# Patient Record
Sex: Female | Born: 1935 | Race: White | Hispanic: No | Smoking: Never smoker
Health system: Southern US, Community
[De-identification: ages and names within clinical notes are randomized; demographics above are authoritative.]

## PROBLEM LIST (undated history)

## (undated) DIAGNOSIS — H409 Unspecified glaucoma: Secondary | ICD-10-CM

## (undated) DIAGNOSIS — C801 Malignant (primary) neoplasm, unspecified: Secondary | ICD-10-CM

## (undated) DIAGNOSIS — M810 Age-related osteoporosis without current pathological fracture: Secondary | ICD-10-CM

## (undated) DIAGNOSIS — I272 Pulmonary hypertension, unspecified: Secondary | ICD-10-CM

## (undated) DIAGNOSIS — E785 Hyperlipidemia, unspecified: Secondary | ICD-10-CM

## (undated) DIAGNOSIS — H3530 Unspecified macular degeneration: Secondary | ICD-10-CM

## (undated) DIAGNOSIS — L409 Psoriasis, unspecified: Secondary | ICD-10-CM

## (undated) DIAGNOSIS — E079 Disorder of thyroid, unspecified: Secondary | ICD-10-CM

## (undated) DIAGNOSIS — L570 Actinic keratosis: Secondary | ICD-10-CM

## (undated) HISTORY — PX: BREAST BIOPSY: SHX20

## (undated) HISTORY — PX: TONSILLECTOMY: SUR1361

## (undated) HISTORY — DX: Actinic keratosis: L57.0

---

## 2004-09-17 ENCOUNTER — Ambulatory Visit: Payer: Self-pay | Admitting: Internal Medicine

## 2005-10-22 ENCOUNTER — Ambulatory Visit: Payer: Self-pay | Admitting: Internal Medicine

## 2006-10-27 ENCOUNTER — Ambulatory Visit: Payer: Self-pay | Admitting: Internal Medicine

## 2007-11-05 ENCOUNTER — Ambulatory Visit: Payer: Self-pay | Admitting: Internal Medicine

## 2008-08-09 ENCOUNTER — Ambulatory Visit: Payer: Self-pay | Admitting: Internal Medicine

## 2008-10-21 ENCOUNTER — Ambulatory Visit: Payer: Self-pay | Admitting: Unknown Physician Specialty

## 2008-11-07 ENCOUNTER — Ambulatory Visit: Payer: Self-pay | Admitting: Internal Medicine

## 2010-02-15 ENCOUNTER — Ambulatory Visit: Payer: Self-pay | Admitting: Internal Medicine

## 2011-03-05 ENCOUNTER — Ambulatory Visit: Payer: Self-pay | Admitting: Internal Medicine

## 2012-02-22 ENCOUNTER — Ambulatory Visit: Payer: Self-pay | Admitting: Medical

## 2012-03-31 ENCOUNTER — Ambulatory Visit: Payer: Self-pay

## 2012-05-27 ENCOUNTER — Ambulatory Visit: Payer: Self-pay | Admitting: Ophthalmology

## 2012-07-08 ENCOUNTER — Ambulatory Visit: Payer: Self-pay | Admitting: Ophthalmology

## 2013-08-04 ENCOUNTER — Ambulatory Visit: Payer: Self-pay | Admitting: Internal Medicine

## 2013-08-10 ENCOUNTER — Ambulatory Visit: Payer: Self-pay | Admitting: Internal Medicine

## 2013-09-01 ENCOUNTER — Ambulatory Visit: Payer: Self-pay | Admitting: Gastroenterology

## 2013-09-21 ENCOUNTER — Ambulatory Visit: Payer: Self-pay | Admitting: Internal Medicine

## 2013-10-13 ENCOUNTER — Ambulatory Visit: Payer: Self-pay | Admitting: Gastroenterology

## 2014-01-12 ENCOUNTER — Ambulatory Visit: Payer: Self-pay | Admitting: Family Medicine

## 2014-01-12 LAB — URINALYSIS, COMPLETE
Bilirubin,UR: NEGATIVE
Blood: NEGATIVE
GLUCOSE, UR: NEGATIVE mg/dL (ref 0–75)
Ketone: NEGATIVE
Nitrite: NEGATIVE
PH: 7.5 (ref 4.5–8.0)
PROTEIN: NEGATIVE
Specific Gravity: 1.005 (ref 1.003–1.030)

## 2014-01-12 LAB — BASIC METABOLIC PANEL
ANION GAP: 7 (ref 7–16)
BUN: 14 mg/dL (ref 7–18)
CALCIUM: 9.4 mg/dL (ref 8.5–10.1)
Chloride: 104 mmol/L (ref 98–107)
Co2: 31 mmol/L (ref 21–32)
Creatinine: 0.91 mg/dL (ref 0.60–1.30)
EGFR (African American): >60
EGFR (Non-African Amer.): 60 — ABNORMAL LOW
Glucose: 83 mg/dL (ref 65–99)
Osmolality: 283 (ref 275–301)
Potassium: 4.2 mmol/L (ref 3.5–5.1)
Sodium: 142 mmol/L (ref 136–145)

## 2014-01-14 LAB — URINE CULTURE

## 2014-11-10 ENCOUNTER — Ambulatory Visit
Admit: 2014-11-10 | Disposition: A | Discharge status: Home / Self Care | Payer: Self-pay | Attending: Internal Medicine | Admitting: Internal Medicine

## 2016-03-11 ENCOUNTER — Other Ambulatory Visit: Payer: Self-pay | Admitting: Internal Medicine

## 2016-03-11 DIAGNOSIS — Z1231 Encounter for screening mammogram for malignant neoplasm of breast: Secondary | ICD-10-CM

## 2016-03-28 ENCOUNTER — Ambulatory Visit: Payer: Self-pay

## 2016-04-02 ENCOUNTER — Other Ambulatory Visit: Payer: Self-pay | Admitting: Internal Medicine

## 2016-04-02 ENCOUNTER — Ambulatory Visit
Admission: RE | Admit: 2016-04-02 | Discharge: 2016-04-02 | Disposition: A | Discharge status: Home / Self Care | Payer: Medicare Other | Source: Ambulatory Visit | Attending: Internal Medicine | Admitting: Internal Medicine

## 2016-04-02 DIAGNOSIS — Z1231 Encounter for screening mammogram for malignant neoplasm of breast: Secondary | ICD-10-CM

## 2016-04-02 HISTORY — DX: Malignant (primary) neoplasm, unspecified: C80.1

## 2016-12-21 ENCOUNTER — Ambulatory Visit: Payer: Medicare Other

## 2016-12-21 ENCOUNTER — Encounter: Payer: Self-pay | Admitting: Emergency Medicine

## 2016-12-21 ENCOUNTER — Ambulatory Visit
Admission: EM | Admit: 2016-12-21 | Discharge: 2016-12-21 | Disposition: A | Discharge status: Home / Self Care | Payer: Medicare Other | Attending: Family Medicine | Admitting: Family Medicine

## 2016-12-21 DIAGNOSIS — J40 Bronchitis, not specified as acute or chronic: Secondary | ICD-10-CM

## 2016-12-21 DIAGNOSIS — R05 Cough: Secondary | ICD-10-CM | POA: Diagnosis present

## 2016-12-21 HISTORY — DX: Age-related osteoporosis without current pathological fracture: M81.0

## 2016-12-21 HISTORY — DX: Pulmonary hypertension, unspecified: I27.20

## 2016-12-21 HISTORY — DX: Psoriasis, unspecified: L40.9

## 2016-12-21 HISTORY — DX: Disorder of thyroid, unspecified: E07.9

## 2016-12-21 HISTORY — DX: Hyperlipidemia, unspecified: E78.5

## 2016-12-21 HISTORY — DX: Unspecified glaucoma: H40.9

## 2016-12-21 HISTORY — DX: Unspecified macular degeneration: H35.30

## 2016-12-21 MED ORDER — GUAIFENESIN-CODEINE 100-10 MG/5ML PO SOLN: 2.5000 mL | Freq: Two times a day (BID) | ORAL | 0 refills | Status: DC | PRN

## 2016-12-21 MED ORDER — DOXYCYCLINE HYCLATE 100 MG PO CAPS: 100.0000 mg | ORAL_CAPSULE | Freq: Two times a day (BID) | ORAL | 0 refills | Status: DC

## 2016-12-21 MED ORDER — HYDROCOD POLST-CPM POLST ER 10-8 MG/5ML PO SUER: 2.5000 mL | Freq: Every evening | ORAL | 0 refills | Status: DC | PRN

## 2016-12-21 MED ORDER — ALBUTEROL SULFATE HFA 108 (90 BASE) MCG/ACT IN AERS: 2.0000 | INHALATION_SPRAY | RESPIRATORY_TRACT | 0 refills | Status: DC | PRN

## 2016-12-21 NOTE — ED Triage Notes (Signed)
Patient c/o cough and chest congestion for a week.  Patient states that her PCP called in some medicine for her on Wed. Azelastine HCL nasal spray, Benzonatate, and Azithromycin.

## 2016-12-21 NOTE — Discharge Instructions (Signed)
Take medication as prescribed. Rest. Drink plenty of fluids.  ° °Follow up with your primary care physician this week. Return to Urgent care for new or worsening concerns.  ° °

## 2016-12-21 NOTE — ED Provider Notes (Signed)
[facility] ____________________________________________  Time seen: Approximately 9:57 AM  I have reviewed the triage vital signs and the nursing notes.   HISTORY  Chief Complaint Cough  HPI Dawn Dickerson is a 83 y.o. female presents with husband at bedside for evaluation of cough and chest congestion. Patient reports symptoms have been present for the last 1 week. Patient reports this past Wednesday she called her primary care office and they sent over a Z-Pak, Astelin nasal spray as well as Tessalon cough pills. Patient reports no definitive improvement of symptoms with the medications. Reports cough continues. Patient states her biggest complaint is a cough as cough keeps her up at night and not resting well. Patient reports that she does have some soreness in her back from coughing but denies any chest pain or shortness of breath. Denies any wheezing sensation. States cough is primarily a dry nonproductive cough but states she feels like there is mucus that she is unable to move. Reports some nasal congestion. Reports husband recently sick with some similar complaints. Denies other known sick contacts. Denies known fevers. Reports overall continues to eat and drink well. Reports continues to stay active.  Denies chest pain, shortness of breath, abdominal pain, dysuria, extremity pain, extremity swelling or rash. Denies recent sickness. Denies recent antibiotic use.   Adin Hector, MD: PCP    Past Medical History:  Diagnosis Date  . Cancer ([place])    skin ca  . Glaucoma   . Hyperlipemia   . Macular degeneration   . Osteoporosis   . Psoriasis   . Pulmonary hypertension ([place])   . Thyroid disease     There are no active problems to display for this patient.   Past Surgical History:  Procedure Laterality Date  . BREAST BIOPSY Bilateral    neg  . TONSILLECTOMY       No current facility-administered medications for this encounter.   Current Outpatient  Prescriptions:  .  benzonatate (TESSALON) 200 MG capsule, Take 200 mg by mouth 2 (two) times daily as needed for cough., Disp: , Rfl:  .  bimatoprost (LUMIGAN) 0.01 % SOLN, Place 1 drop into the right eye at bedtime., Disp: , Rfl:  .  Calcium 600-400 MG-UNIT CHEW, Chew by mouth., Disp: , Rfl:  .  clobetasol cream (TEMOVATE) 8.41 %, Apply 1 application topically 2 (two) times daily., Disp: , Rfl:  .  levothyroxine (SYNTHROID, LEVOTHROID) 175 MCG tablet, Take 175 mcg by mouth daily before breakfast., Disp: , Rfl:  .  Multiple Vitamin (MULTIVITAMIN) tablet, Take 1 tablet by mouth daily., Disp: , Rfl:  .  Multiple Vitamins-Minerals (PRESERVISION AREDS 2+MULTI VIT PO), Take by mouth., Disp: , Rfl:  .  simvastatin (ZOCOR) 20 MG tablet, Take 20 mg by mouth daily., Disp: , Rfl:  .  timolol (BETIMOL) 0.5 % ophthalmic solution, Place 1 drop into both eyes 2 (two) times daily., Disp: , Rfl:  .  albuterol (PROVENTIL HFA;VENTOLIN HFA) 108 (90 Base) MCG/ACT inhaler, Inhale 2 puffs into the lungs every 4 (four) hours as needed., Disp: 1 Inhaler, Rfl: 0 .  doxycycline (VIBRAMYCIN) 100 MG capsule, Take 1 capsule (100 mg total) by mouth 2 (two) times daily., Disp: 20 capsule, Rfl: 0 .  guaiFENesin-codeine 100-10 MG/5ML syrup, Take 2.5 mLs by mouth 2 (two) times daily as needed for cough. Do not drive while taking as can cause drowsiness., Disp: 60 mL, Rfl: 0  Allergies Patient has no known allergies.  Family History  Problem  Relation Age of Onset  . Hyperlipidemia Mother   . Coronary artery disease Mother   . Breast cancer Neg Hx     Social History Social History  Substance Use Topics  . Smoking status: Never Smoker  . Smokeless tobacco: Never Used  . Alcohol use No    Review of Systems Constitutional: No fever/chills ENT: No sore throat. Cardiovascular: Denies chest pain. Respiratory: Denies shortness of breath. Gastrointestinal: No abdominal pain.  No nausea, no vomiting.   Musculoskeletal:  Negative for back pain. Skin: Negative for rash.   ____________________________________________   PHYSICAL EXAM:  VITAL SIGNS: ED Triage Vitals  Enc Vitals Group     BP 12/21/16 0923 138/61     Pulse Rate 12/21/16 0923 79     Resp 12/21/16 0923 16     Temp 12/21/16 0923 98 F (36.7 C)     Temp Source 12/21/16 0923 Oral     SpO2 12/21/16 0923 97 %     Weight 12/21/16 0922 138 lb (62.6 kg)     Height 12/21/16 0922 5\' 6"  (1.676 m)     Head Circumference --      Peak Flow --      Pain Score 12/21/16 0922 3     Pain Loc --      Pain Edu? --      Excl. in [place]? --     Constitutional: Alert and oriented. Well appearing and in no acute distress. Eyes: Conjunctivae are normal. PERRL. EOMI. ENT      Head: Normocephalic and atraumatic.      Nose: Mild nasal congestion.      Mouth/Throat: Mucous membranes are moist.Oropharynx non-erythematous. No tonsillar swelling or exudate. Neck: No stridor. Supple without meningismus.  Hematological/Lymphatic/Immunilogical: No cervical lymphadenopathy. Cardiovascular: Normal rate, regular rhythm. Grossly normal heart sounds.  Good peripheral circulation. Respiratory: Normal respiratory effort without tachypnea nor retractions. Breath sounds are clear and equal bilaterally. Mild scattered rhonchi. No wheezes. Good air movement. Dry intermittent cough noted in room. Gastrointestinal: Soft and nontender.  Musculoskeletal: No midline cervical, thoracic or lumbar tenderness to palpation. Bilateral pedal pulses equal and easily palpated.      Right lower leg:  No tenderness or edema.      Left lower leg:  No tenderness or edema.  Neurologic:  Normal speech and language.Marland Kitchen Speech is normal. No gait instability.  Skin:  Skin is warm, dry Psychiatric: Mood and affect are normal. Speech and behavior are normal. Patient exhibits appropriate insight and judgment   ___________________________________________   LABS (all labs ordered are listed, but only  abnormal results are displayed)  Labs Reviewed - No data to display ____________________________________________  RADIOLOGY  Dg Chest 2 View  Result Date: 12/21/2016 CLINICAL DATA:  Persistent cough for 1 week, pulmonary hypertension, psoriasis, hyperlipidemia EXAM: CHEST  2 VIEW COMPARISON:  None. FINDINGS: Mild enlargement of cardiac silhouette. Mediastinal contours and pulmonary vascularity normal. Atherosclerotic calcification aorta. Bronchitic changes with mild hyperinflation. No pulmonary infiltrate, pleural effusion or pneumothorax. Minimal RIGHT apical scarring. Bones demineralized. IMPRESSION: Bronchitic changes with minimal hyperinflation. Aortic atherosclerosis. No acute abnormalities. Electronically Signed   By: Lavonia Dana M.D.   On: 12/21/2016 10:29   ____________________________________________   PROCEDURES Procedures   INITIAL IMPRESSION / ASSESSMENT AND PLAN / ED COURSE  Pertinent labs & imaging results that were available during my care of the patient were reviewed by me and considered in my medical decision making (see chart for details).  Well-appearing patient. No acute distress.  Husband at bedside. Cough and congestion 1 week. Will evaluate chest x-ray. Chest x-ray per radiologist bronchitic changes with minimal hyperinflation, no pulmonary infiltrate.  Discussed with patient we'll stop azithromycin and start oral doxycycline. Patient states that she has tolerated oral prescription cough syrups in the past. Discussed the patient utilizing very low dose of when necessary guaifenesin with codeine. When necessary albuterol inhaler.  Encouraged rest, fluids and supportive care. Encourage follow-up with primary care physician this week.Discussed indication, risks and benefits of medications with patient.  Discussed follow up with Primary care physician this week. Discussed follow up and return parameters including no resolution or any worsening concerns. Patient verbalized  understanding and agreed to plan.   ____________________________________________   FINAL CLINICAL IMPRESSION(S) / ED DIAGNOSES  Final diagnoses:  Bronchitis     Discharge Medication List as of 12/21/2016 10:52 AM    START taking these medications   Details  albuterol (PROVENTIL HFA;VENTOLIN HFA) 108 (90 Base) MCG/ACT inhaler Inhale 2 puffs into the lungs every 4 (four) hours as needed., Starting Sat 12/21/2016, Normal    doxycycline (VIBRAMYCIN) 100 MG capsule Take 1 capsule (100 mg total) by mouth 2 (two) times daily., Starting Sat 12/21/2016, Normal    guaiFENesin-codeine 100-10 MG/5ML syrup Take 2.5 mLs by mouth 2 (two) times daily as needed for cough. Do not drive while taking as can cause drowsiness., Starting Sat 12/21/2016, Print        Note: This dictation was prepared with Dragon dictation along with smaller phrase technology. Any transcriptional errors that result from this process are unintentional.         Marylene Land, NP 12/21/16 1333

## 2017-03-14 ENCOUNTER — Other Ambulatory Visit: Payer: Self-pay | Admitting: Internal Medicine

## 2017-03-14 DIAGNOSIS — Z1231 Encounter for screening mammogram for malignant neoplasm of breast: Secondary | ICD-10-CM

## 2017-04-07 ENCOUNTER — Ambulatory Visit
Admission: RE | Admit: 2017-04-07 | Discharge: 2017-04-07 | Disposition: A | Discharge status: Home / Self Care | Payer: Medicare Other | Source: Ambulatory Visit | Attending: Internal Medicine | Admitting: Internal Medicine

## 2017-04-07 DIAGNOSIS — Z1231 Encounter for screening mammogram for malignant neoplasm of breast: Secondary | ICD-10-CM | POA: Diagnosis not present

## 2017-06-24 ENCOUNTER — Other Ambulatory Visit: Payer: Self-pay | Admitting: Internal Medicine

## 2017-06-24 DIAGNOSIS — R42 Dizziness and giddiness: Secondary | ICD-10-CM

## 2017-06-30 ENCOUNTER — Ambulatory Visit
Admission: RE | Admit: 2017-06-30 | Discharge: 2017-06-30 | Disposition: A | Discharge status: Home / Self Care | Payer: Medicare Other | Source: Ambulatory Visit | Attending: Internal Medicine | Admitting: Internal Medicine

## 2017-06-30 DIAGNOSIS — R42 Dizziness and giddiness: Secondary | ICD-10-CM | POA: Diagnosis not present

## 2017-06-30 DIAGNOSIS — I6523 Occlusion and stenosis of bilateral carotid arteries: Secondary | ICD-10-CM | POA: Diagnosis not present

## 2018-03-04 ENCOUNTER — Other Ambulatory Visit: Payer: Self-pay | Admitting: Internal Medicine

## 2018-03-04 DIAGNOSIS — Z1231 Encounter for screening mammogram for malignant neoplasm of breast: Secondary | ICD-10-CM

## 2018-04-08 ENCOUNTER — Ambulatory Visit
Admission: RE | Admit: 2018-04-08 | Discharge: 2018-04-08 | Disposition: A | Discharge status: Home / Self Care | Payer: Medicare Other | Source: Ambulatory Visit | Attending: Internal Medicine | Admitting: Internal Medicine

## 2018-04-08 ENCOUNTER — Encounter (INDEPENDENT_AMBULATORY_CARE_PROVIDER_SITE_OTHER): Payer: Self-pay

## 2018-04-08 DIAGNOSIS — Z1231 Encounter for screening mammogram for malignant neoplasm of breast: Secondary | ICD-10-CM | POA: Insufficient documentation

## 2018-05-31 ENCOUNTER — Ambulatory Visit
Admission: EM | Admit: 2018-05-31 | Discharge: 2018-05-31 | Disposition: A | Discharge status: Home / Self Care | Payer: Medicare Other

## 2018-05-31 ENCOUNTER — Encounter: Payer: Self-pay | Admitting: Gynecology

## 2018-05-31 ENCOUNTER — Other Ambulatory Visit: Payer: Self-pay

## 2018-05-31 DIAGNOSIS — B029 Zoster without complications: Secondary | ICD-10-CM | POA: Diagnosis not present

## 2018-05-31 MED ORDER — VALACYCLOVIR HCL 1 G PO TABS: 1000.0000 mg | ORAL_TABLET | Freq: Two times a day (BID) | ORAL | 0 refills | Status: AC

## 2018-05-31 MED ORDER — TRAMADOL HCL 50 MG PO TABS: ORAL_TABLET | ORAL | 0 refills | Status: DC

## 2018-05-31 NOTE — ED Triage Notes (Signed)
Patient c/o rash on her left side. Per patient question having shingles.

## 2018-05-31 NOTE — Discharge Instructions (Addendum)
Take medication as prescribed. Rest. Drink plenty of fluids. Continue to monitor.   Follow up with your primary care physician this week. Return to Urgent care for new or worsening concerns.

## 2018-05-31 NOTE — ED Provider Notes (Signed)
[facility] ____________________________________________  Time seen: Approximately 12:47 PM  I have reviewed the triage vital signs and the nursing notes.   HISTORY  Chief Complaint Herpes Zoster   HPI Dawn Dickerson is a 84 y.o. female presenting for evaluation to left side present since Thursday into Friday.  States she describes the rash is a very burning, occasionally itchy and tender rash.  Denies known trigger.  Denies any other rash or skin changes.  Denies change in food medicine, lotions, detergents or other contacts.  States tenderness is predominantly at night when trying to lay down.  No trauma.  Denies accompanying chest pain, shortness of breath, fevers.  Reports continues to eat and drink well.  States tried some over-the-counter Tylenol and applying Vaseline without any change.  Denies history of similar.  Did have chickenpox as a child.  Has had 1 shingles vaccine.  Reports otherwise doing well denies other complaints.  Adin Hector, MD: PCP   Past Medical History:  Diagnosis Date  . Cancer ([place])    skin ca  . Glaucoma   . Hyperlipemia   . Macular degeneration   . Osteoporosis   . Psoriasis   . Pulmonary hypertension ([place])   . Thyroid disease     There are no active problems to display for this patient.   Past Surgical History:  Procedure Laterality Date  . BREAST BIOPSY Bilateral    neg  . TONSILLECTOMY       No current facility-administered medications for this encounter.   Current Outpatient Medications:  .  bimatoprost (LUMIGAN) 0.01 % SOLN, Place 1 drop into the right eye at bedtime., Disp: , Rfl:  .  Calcium 600-400 MG-UNIT CHEW, Chew by mouth., Disp: , Rfl:  .  levothyroxine (SYNTHROID, LEVOTHROID) 175 MCG tablet, Take 175 mcg by mouth daily before breakfast., Disp: , Rfl:  .  Multiple Vitamin (MULTIVITAMIN) tablet, Take 1 tablet by mouth daily., Disp: , Rfl:  .  Multiple Vitamins-Minerals (PRESERVISION AREDS 2+MULTI VIT  PO), Take by mouth., Disp: , Rfl:  .  simvastatin (ZOCOR) 20 MG tablet, Take 20 mg by mouth daily., Disp: , Rfl:  .  timolol (BETIMOL) 0.5 % ophthalmic solution, Place 1 drop into both eyes 2 (two) times daily., Disp: , Rfl:  .  aspirin EC 81 MG tablet, Take by mouth., Disp: , Rfl:  .  clobetasol cream (TEMOVATE) 3.50 %, Apply 1 application topically 2 (two) times daily., Disp: , Rfl:  .  traMADol (ULTRAM) 50 MG tablet, Take 0.5-1 tablet (25-50mg ) orally twice a day as needed for pain, Disp: 6 tablet, Rfl: 0 .  valACYclovir (VALTREX) 1000 MG tablet, Take 1 tablet (1,000 mg total) by mouth 2 (two) times daily for 7 days., Disp: 14 tablet, Rfl: 0  Allergies Prednisone and Alendronate  Family History  Problem Relation Age of Onset  . Hyperlipidemia Mother   . Coronary artery disease Mother   . Breast cancer Neg Hx     Social History Social History   Tobacco Use  . Smoking status: Never Smoker  . Smokeless tobacco: Never Used  Substance Use Topics  . Alcohol use: No  . Drug use: No    Review of Systems Constitutional: No fever Cardiovascular: Denies chest pain. Respiratory: Denies shortness of breath. Gastrointestinal: No abdominal pain.  No nausea, no vomiting. Skin: Positive rash. ____________________________________________   PHYSICAL EXAM:  VITAL SIGNS: ED Triage Vitals  Enc Vitals Group     BP 05/31/18 1103 (!)  165/66     Pulse Rate 05/31/18 1103 66     Resp 05/31/18 1103 16     Temp 05/31/18 1103 97.9 F (36.6 C)     Temp Source 05/31/18 1103 Oral     SpO2 05/31/18 1103 98 %     Weight --      Height --      Head Circumference --      Peak Flow --      Pain Score 05/31/18 1102 2     Pain Loc --      Pain Edu? --      Excl. in [place]? --     Constitutional: Alert and oriented. Well appearing and in no acute distress. ENT      Head: Normocephalic and atraumatic. Cardiovascular: Normal rate, regular rhythm. Grossly normal heart sounds.  Good peripheral  circulation. Respiratory: Normal respiratory effort without tachypnea nor retractions. Breath sounds are clear and equal bilaterally. No wheezes, rales, rhonchi. Gastrointestinal: Soft and nontender.  No CVA tenderness. Musculoskeletal:  No midline cervical, thoracic or lumbar tenderness to palpation.  Neurologic:  Normal speech and language. Speech is normal. No gait instability.  Skin:  Skin is warm, dry.  Except: Left lateral chest wrapping along the left lower breast erythematous vesicular clustered rash, nontender to direct palpation at this time, no drainage, no further surrounding erythema, rash does not cross midline, no point bony tenderness. Psychiatric: Mood and affect are normal. Speech and behavior are normal. Patient exhibits appropriate insight and judgment   ___________________________________________   LABS (all labs ordered are listed, but only abnormal results are displayed)  Labs Reviewed - No data to display ____________________________________________ PROCEDURES Procedures   INITIAL IMPRESSION / ASSESSMENT AND PLAN / ED COURSE  Pertinent labs & imaging results that were available during my care of the patient were reviewed by me and considered in my medical decision making (see chart for details).  Well-appearing patient.  No acute distress.  Rash clinical appearance consistent with shingles.  Creatinine clearance calculated from labs obtained via care everywhere 03/13/2018, creatinine clearance 46.  Will treat with Valtrex 1 g twice daily based on creatinine clearance.  Patient states in the past she is tolerating pain medications well.  Will Rx quantity 6 tramadol as needed, discussed taking half a tablet first, and only up to twice a day.  Discussed follow-up with primary care this week.  Monitor.  Supportive care. Discussed transmission. Discussed indication, risks and benefits of medications with patient.   Discussed follow up with Primary care physician this week.  Discussed follow up and return parameters including no resolution or any worsening concerns. Patient verbalized understanding and agreed to plan.   [place] controlled substance database reviewed, no recent controlled substances documented.  ____________________________________________   FINAL CLINICAL IMPRESSION(S) / ED DIAGNOSES  Final diagnoses:  Herpes zoster without complication     ED Discharge Orders         Ordered    valACYclovir (VALTREX) 1000 MG tablet  2 times daily     05/31/18 1239    traMADol (ULTRAM) 50 MG tablet     05/31/18 1239           Note: This dictation was prepared with Dragon dictation along with smaller phrase technology. Any transcriptional errors that result from this process are unintentional.         Marylene Land, NP 05/31/18 1254

## 2019-04-15 ENCOUNTER — Other Ambulatory Visit: Payer: Self-pay | Admitting: Internal Medicine

## 2019-04-15 DIAGNOSIS — Z1231 Encounter for screening mammogram for malignant neoplasm of breast: Secondary | ICD-10-CM

## 2019-04-29 ENCOUNTER — Ambulatory Visit
Admission: RE | Admit: 2019-04-29 | Discharge: 2019-04-29 | Disposition: A | Discharge status: Home / Self Care | Payer: Medicare Other | Source: Ambulatory Visit | Attending: Internal Medicine | Admitting: Internal Medicine

## 2019-04-29 ENCOUNTER — Other Ambulatory Visit: Payer: Self-pay

## 2019-04-29 ENCOUNTER — Encounter (INDEPENDENT_AMBULATORY_CARE_PROVIDER_SITE_OTHER): Payer: Self-pay

## 2019-04-29 DIAGNOSIS — Z1231 Encounter for screening mammogram for malignant neoplasm of breast: Secondary | ICD-10-CM | POA: Insufficient documentation

## 2019-10-12 ENCOUNTER — Other Ambulatory Visit: Payer: Self-pay

## 2019-10-12 ENCOUNTER — Ambulatory Visit (INDEPENDENT_AMBULATORY_CARE_PROVIDER_SITE_OTHER): Payer: Medicare Other | Admitting: Dermatology

## 2019-10-12 ENCOUNTER — Encounter: Payer: Self-pay | Admitting: Dermatology

## 2019-10-12 DIAGNOSIS — L03113 Cellulitis of right upper limb: Secondary | ICD-10-CM

## 2019-10-12 DIAGNOSIS — L409 Psoriasis, unspecified: Secondary | ICD-10-CM | POA: Diagnosis not present

## 2019-10-12 MED ORDER — SULFAMETHOXAZOLE-TRIMETHOPRIM 800-160 MG PO TABS: 1.0000 | ORAL_TABLET | Freq: Two times a day (BID) | ORAL | 0 refills | Status: AC

## 2019-10-12 MED ORDER — CLOBETASOL PROP EMOLLIENT BASE 0.05 % EX CREA: TOPICAL_CREAM | CUTANEOUS | 1 refills | Status: DC

## 2019-10-12 MED ORDER — MUPIROCIN 2 % EX OINT: TOPICAL_OINTMENT | CUTANEOUS | 1 refills | Status: DC

## 2019-10-12 NOTE — Progress Notes (Addendum)
    Follow-Up Visit   Subjective  Dawn Dickerson is a 86 y.o. female who presents for the following: Redness and swelling of biopsy site (started yesterday).  The following portions of the chart were reviewed this encounter and updated as appropriate: Tobacco  Allergies  Meds  Problems  Med Hx  Surg Hx  Fam Hx      Review of Systems: No other skin or systemic complaints.  Objective  Well appearing patient in no apparent distress; mood and affect are within normal limits.  A focused examination was performed including arms, legs, hands. Relevant physical exam findings are noted in the Assessment and Plan.  Objective  Right hand dorsum, base of 4th finger: Edematous, erythematous plaque.  Objective  Arms, hands, legs: Well-demarcated erythematous papules/plaques, guttate pink scaly papules. Biopsy proven psoriasis  Assessment & Plan  Cellulitis of right upper extremity Right hand dorsum, base of 4th finger  mupirocin ointment (BACTROBAN) 2 % - Right hand dorsum, base of 4th finger  sulfamethoxazole-trimethoprim (BACTRIM DS) 800-160 MG tablet - Right hand dorsum, base of 4th finger  Psoriasis Arms, hands, legs  Suture removed on hand today. Suture left in place on right thigh - to be removed on follow-up.  Clobetasol Prop Emollient Base (CLOBETASOL PROPIONATE E) 0.05 % emollient cream - Arms, hands, legs .  The following portions of the chart were reviewed this encounter and updated as appropriate: Tobacco  Allergies  Meds  Problems  Med Hx  Surg Hx  Fam Hx

## 2019-10-15 ENCOUNTER — Ambulatory Visit (INDEPENDENT_AMBULATORY_CARE_PROVIDER_SITE_OTHER): Payer: Medicare Other | Admitting: Dermatology

## 2019-10-15 ENCOUNTER — Encounter: Payer: Self-pay | Admitting: Dermatology

## 2019-10-15 ENCOUNTER — Other Ambulatory Visit: Payer: Self-pay

## 2019-10-15 DIAGNOSIS — L409 Psoriasis, unspecified: Secondary | ICD-10-CM

## 2019-10-15 DIAGNOSIS — L03113 Cellulitis of right upper limb: Secondary | ICD-10-CM

## 2019-10-15 NOTE — Patient Instructions (Signed)
After Suture Removal  If your medical team has placed Steri-Strips (white adhesive strips covering the surgical site to provide extra support):  Keep the area dry until they fall off.   Do not peel them off. Just let them fall off on their own.   If the edges peel up, you can trim them with scissors.   If your team has not placed Steri-Strips:  Wash the area daily with soap and water.  Then coat the incision site with plain Vaseline and cover with a bandage. Do this daily for 5 days after the sutures are removed.  After that, no additional wound care is generally needed.  However, if you would like to help fade the scar, you can apply a silicone scar cream, gel or sheet every night. The scar will remodel for one year after the procedure.  If a skin cancer was removed, be sure to keep your appointment with your dermatologist for follow-up and let your dermatology team know if you have any new or changing spots between visits.    Please call our office or any questions or concerns.

## 2019-10-15 NOTE — Progress Notes (Signed)
   Follow-Up Visit   Subjective  Dawn Dickerson is a 86 y.o. female who presents for the following: recheck punch biopsy site (Pt has noticed some improvement at biopsy site of the R hand dorsum, but erythema and drainage still present) and recheck psoriasis (patient has noticed a rash on her buttocks, crease, and R thigh she wanted to know if it was psoriasis too, and if so can she use the Clobetasol?).  The following portions of the chart were reviewed this encounter and updated as appropriate:     Review of Systems: No other skin or systemic complaints.  Objective  Well appearing patient in no apparent distress; mood and affect are within normal limits.  A focused examination was performed including B/L leg, B/L hands, and buttocks. Relevant physical exam findings are noted in the Assessment and Plan.  Objective  R hand dorsum: Erythematous edematous patch with some oozing at biopsy site  Objective  Right Lower Back: Some pink scaly patches of the gluteal cleft, buttocks, and legs.   Assessment & Plan  Cellulitis of right upper extremity R hand dorsum  Continue Bactrim DS BID until finished (patient has been on for about 3 days now), and Mupirocin 2% ointment daily.   Aerobic Culture - R hand dorsum  Psoriasis Right Lower Back  Continue Clobetasol cream QD-BID PRN. Avoid f/g/a.  Other Related Medications Clobetasol Prop Emollient Base (CLOBETASOL PROPIONATE E) 0.05 % emollient cream   Return in about 3 days (around 10/18/2019) for F/U appt. -schedule Monday or Tuesdays per Dr. Nicole Kindred.   [name], CMA, am acting as scribe for Dawn Patty, MD .

## 2019-10-18 ENCOUNTER — Telehealth: Payer: Self-pay

## 2019-10-18 LAB — AEROBIC CULTURE

## 2019-10-18 NOTE — Telephone Encounter (Signed)
-----   Message from [name], MD sent at 10/18/2019  1:38 PM EDT ----- Positive for Staph aureus.  Pt just finished 1 week Bactrim DS.  Send in Cephalexin 500 mg PO tid x 7 days and continue mupirocin ointment bid.  Keep f/up appt

## 2019-10-18 NOTE — Telephone Encounter (Signed)
Lft pt msg to call for culture result/sh ?

## 2019-10-19 ENCOUNTER — Ambulatory Visit (INDEPENDENT_AMBULATORY_CARE_PROVIDER_SITE_OTHER): Payer: Medicare Other | Admitting: Dermatology

## 2019-10-19 ENCOUNTER — Other Ambulatory Visit: Payer: Self-pay

## 2019-10-19 ENCOUNTER — Telehealth: Payer: Self-pay

## 2019-10-19 ENCOUNTER — Encounter: Payer: Self-pay | Admitting: Dermatology

## 2019-10-19 DIAGNOSIS — L03011 Cellulitis of right finger: Secondary | ICD-10-CM | POA: Diagnosis not present

## 2019-10-19 DIAGNOSIS — L409 Psoriasis, unspecified: Secondary | ICD-10-CM | POA: Diagnosis not present

## 2019-10-19 MED ORDER — CEPHALEXIN 500 MG PO CAPS: 500.0000 mg | ORAL_CAPSULE | Freq: Three times a day (TID) | ORAL | 0 refills | Status: AC

## 2019-10-19 NOTE — Progress Notes (Signed)
   Follow-Up Visit   Subjective  Dawn Dickerson is a 86 y.o. female who presents for the following: Follow-up (celluitis right hand, bacterial culture taken on Friday, about the same).  Patients finishes 1 wk of Bactrim DS today.  Still red and tender to touch, but not spreading.    The following portions of the chart were reviewed this encounter and updated as appropriate:     Review of Systems: No other skin or systemic complaints.  Objective  Well appearing patient in no apparent distress; mood and affect are within normal limits.  A focused examination was performed including right hand. Relevant physical exam findings are noted in the Assessment and Plan.  Objective  Right Proximal 4th Finger and Hand Dorsum: Erythematous patch with mild crusting at bx site, tender to touch, decreased edema, Culture proven staph aureus MSSA  Objective  Left all  Metacarpophalangeal Joint on hand: Decreasing erythema on left hand MCPs- Improving  Assessment & Plan  Cellulitis of finger of right hand Right Proximal 4th Finger and Hand Dorsum  Patient finished 7 day course of Bactrim DS today.  Will start Keflex 500 mg TID x 10 days Cont. Mupirocin ointment bid  cephALEXin (KEFLEX) 500 MG capsule - Right Proximal 4th Finger and Hand Dorsum  Psoriasis Left all  Metacarpophalangeal Joint on hand  Cont. Clobetasol cream BID for another week. Avoid to face, groin, axillae. Reviewed risk of skin atrophy with long-term use.    Other Related Medications Clobetasol Prop Emollient Base (CLOBETASOL PROPIONATE E) 0.05 % emollient cream  Return in about 1 week (around 10/26/2019) for return for new or changing spots.   Marene Lenz, CMA, am acting as scribe for [name], MD .

## 2019-10-19 NOTE — Telephone Encounter (Signed)
-----   Message from [name], MD sent at 10/18/2019  1:38 PM EDT ----- Positive for Staph aureus.  Pt just finished 1 week Bactrim DS.  Send in Cephalexin 500 mg PO tid x 7 days and continue mupirocin ointment bid.  Keep f/up appt

## 2019-10-19 NOTE — Telephone Encounter (Signed)
Patient was told of culture results at F/U appt today and will start Cephalexin 500 mg TID

## 2019-10-26 ENCOUNTER — Other Ambulatory Visit: Payer: Self-pay

## 2019-10-26 ENCOUNTER — Ambulatory Visit (INDEPENDENT_AMBULATORY_CARE_PROVIDER_SITE_OTHER): Payer: Medicare Other | Admitting: Dermatology

## 2019-10-26 ENCOUNTER — Encounter: Payer: Self-pay | Admitting: Dermatology

## 2019-10-26 DIAGNOSIS — L03011 Cellulitis of right finger: Secondary | ICD-10-CM | POA: Diagnosis not present

## 2019-10-26 DIAGNOSIS — L400 Psoriasis vulgaris: Secondary | ICD-10-CM | POA: Diagnosis not present

## 2019-10-26 MED ORDER — DOXYCYCLINE HYCLATE 100 MG PO TABS: 100.0000 mg | ORAL_TABLET | Freq: Two times a day (BID) | ORAL | 0 refills | Status: AC

## 2019-10-26 NOTE — Progress Notes (Signed)
   Follow-Up Visit   Subjective  Dawn Dickerson is a 86 y.o. female who presents for the following: Follow-up (cellulitis  right 3rd finger webspace. Still painful, red, with less swelling.).  She has been taking Keflex 500 mg tid for past week and using topical mupirocin ointment.  Prior to that she completed a week course of Septra DS.   The following portions of the chart were reviewed this encounter and updated as appropriate:     Review of Systems: No other skin or systemic complaints.  Objective  Well appearing patient in no apparent distress; mood and affect are within normal limits.  A focused examination was performed including 3rd finger webspace. Relevant physical exam findings are noted in the Assessment and Plan.  Objective  Right 3rd Finger web sapce: Erythematous patch on R hand dorsum 3rd and 4th mcp extending to base of 4th finger dorsum.  Still tender.  Minimal change from previous visit.  Not any larger.  Culture proven MSSA  Objective  Left Hand - Posterior, Right Hand - Posterior, Right Thigh - Anterior: Well-demarcated erythematous patches.  Decreased erythema. No scale.  Assessment & Plan  Cellulitis of finger of right hand Right 3rd Finger web sapce  Will Start Doxycycline 100mg  BID for 14 days with compression on affected area at night with nitrile glove Apply mupirocin ointment to biopsy site and then Clobetasol cream to red area surrounding.  Doxycycline should be taken with food to prevent nausea. Do not lay down for 30 minutes after taking. Be cautious with sun exposure and use good sun protection while on this medication.   doxycycline (VIBRA-TABS) 100 MG tablet - Right 3rd Finger web sapce  Other Related Medications cephALEXin (KEFLEX) 500 MG capsule  Plaque psoriasis (3) Left Hand - Posterior; Right Hand - Posterior; Right Thigh - Anterior  Improving. Continue Clobetasol cream to all affected areas qd/bid until rash cleared.  Return in about 2  weeks (around 11/09/2019) for R hand cellulitis, psorasis.   Marene Lenz, CMA, am acting as scribe for [name], MD .

## 2019-11-12 ENCOUNTER — Ambulatory Visit: Payer: Medicare Other | Admitting: Dermatology

## 2019-11-16 ENCOUNTER — Ambulatory Visit (INDEPENDENT_AMBULATORY_CARE_PROVIDER_SITE_OTHER): Payer: Medicare Other | Admitting: Dermatology

## 2019-11-16 ENCOUNTER — Other Ambulatory Visit: Payer: Self-pay

## 2019-11-16 DIAGNOSIS — L03011 Cellulitis of right finger: Secondary | ICD-10-CM

## 2019-11-16 DIAGNOSIS — L409 Psoriasis, unspecified: Secondary | ICD-10-CM | POA: Diagnosis not present

## 2019-11-16 MED ORDER — BETAMETHASONE DIPROPIONATE 0.05 % EX CREA: TOPICAL_CREAM | CUTANEOUS | 3 refills | Status: DC

## 2019-11-16 NOTE — Patient Instructions (Addendum)
Patient advised if area becomes bright red, swollen or hot it could be a sign of infection and she should call the office to be evaluated.   Topical steroids (such as triamcinolone, fluocinolone, fluocinonide, mometasone, clobetasol, halobetasol, betamethasone, hydrocortisone) can cause thinning and lightening of the skin if they are used for too long in the same area. Your physician has selected the right strength medicine for your problem and area affected on the body. Please use your medication only as directed by your physician to prevent side effects.

## 2019-11-16 NOTE — Progress Notes (Signed)
   Follow-Up Visit   Subjective  Dawn Dickerson is a 86 y.o. female who presents for the following: Follow-up.  Patient here today for follow up of cellulitis of the right 4th finger. She did have a positive culture for staph and was started on Cephalexin 500mg  PO TID x 7 days as well as mupirocin topically. On her 1 week follow up she was started on doxycycline 100mg  BID with food. Just finished that last week.  Pt could not tolerate compression on the area with glove.  Patient also is treated for psoriasis and has a new spot on the right lower leg she think may be psoriasis.   The following portions of the chart were reviewed this encounter and updated as appropriate:     Review of Systems:  No other skin or systemic complaints except as noted in HPI or Assessment and Plan.  Objective  Well appearing patient in no apparent distress; mood and affect are within normal limits.  A focused examination was performed including right hand, lower legs. Relevant physical exam findings are noted in the Assessment and Plan.  Objective  Right 4th Finger: 4th MCP with healing biopsy site with surrounding mild dusky erythema, decreased from previous visit  Objective  Left Hand Dorsum, Left knee, Left elbow: Pink scaly papules and patches   Assessment & Plan  Cellulitis of finger of right hand Right 4th Finger  Cellulitis resolved  Patient will continue mupirocin to healing biopsy site and cover.  Discussed signs/symptoms of infection and pt will call if problems  Psoriasis Left Hand Dorsum, Left knee, Left elbow  Continue clobetasol cream BID PRN Discussed Xtrac treatment. Patient has done NVUVB in the past.  Also discussed Otezla vs Biologics.  No joint pain or stiffness, but pt has arthritic joint deformities.  Patient defers treatment with [place] Nail, prefers topicals.  She does not drive so she does not want to pursue Xtrac.  Clobetasol was too expensive for patient so will send in  diprolene 0.05% cream to use BID PRN. Avoid F/G/A.  Topical steroids (such as triamcinolone, fluocinolone, fluocinonide, mometasone, clobetasol, halobetasol, betamethasone, hydrocortisone) can cause thinning and lightening of the skin if they are used for too long in the same area. Your physician has selected the right strength medicine for your problem and area affected on the body. Please use your medication only as directed by your physician to prevent side effects.    Ordered Medications: betamethasone dipropionate 0.05 % cream  Other Related Medications Clobetasol Prop Emollient Base (CLOBETASOL PROPIONATE E) 0.05 % emollient cream  Return in about 3 months (around 02/15/2020) for psoriasis.  Graciella Belton, RMA, am acting as scribe for [name], MD .  Documentation: I have reviewed the above documentation for accuracy and completeness, and I agree with the above.  [name], MD

## 2020-02-15 ENCOUNTER — Other Ambulatory Visit: Payer: Self-pay

## 2020-02-15 ENCOUNTER — Ambulatory Visit (INDEPENDENT_AMBULATORY_CARE_PROVIDER_SITE_OTHER): Payer: Medicare Other | Admitting: Dermatology

## 2020-02-15 DIAGNOSIS — L820 Inflamed seborrheic keratosis: Secondary | ICD-10-CM | POA: Diagnosis not present

## 2020-02-15 DIAGNOSIS — Z872 Personal history of diseases of the skin and subcutaneous tissue: Secondary | ICD-10-CM | POA: Diagnosis not present

## 2020-02-15 DIAGNOSIS — L409 Psoriasis, unspecified: Secondary | ICD-10-CM | POA: Diagnosis not present

## 2020-02-15 MED ORDER — TACROLIMUS 0.1 % EX OINT: TOPICAL_OINTMENT | Freq: Every day | CUTANEOUS | 2 refills | Status: DC

## 2020-02-15 MED ORDER — BETAMETHASONE DIPROPIONATE 0.05 % EX CREA: TOPICAL_CREAM | CUTANEOUS | 1 refills | Status: DC

## 2020-02-15 NOTE — Progress Notes (Signed)
   Follow-Up Visit   Subjective  Dawn Dickerson is a 86 y.o. female who presents for the following: Follow-up.  Patient here to follow up on cellulitis of right 4th finger following a biopsy. She was using mupriocin amd it seems to have healed. Patient also being treated for psoriasis with clobetasol (too expensive) and betamethasone dipropionate cream. She has an active area at the left thigh and scalp.  The pink patches stay on her knuckles.   The following portions of the chart were reviewed this encounter and updated as appropriate:      Review of Systems:  No other skin or systemic complaints except as noted in HPI or Assessment and Plan.  Objective  Well appearing patient in no apparent distress; mood and affect are within normal limits.  A focused examination was performed including hands, legs. Relevant physical exam findings are noted in the Assessment and Plan.  Objective  3rd webspace: Mild residual erythema, no evidence of infection   Objective  Hands: Pink scaly patches at MCP's  Objective  Left lateral thigh, right frontal scalp: Erythematous keratotic or waxy stuck-on papule    Assessment & Plan  History of cellulitis 3rd webspace  resolved  Psoriasis Hands  Start tacrolimus to affected areas 1-2 times daily as needed for psoriasis.  May use betamethasone qd to more severe areas as needed for flares up to 4 weeks. Avoid face, groin, underarms.   tacrolimus (PROTOPIC) 0.1 % ointment - Hands  betamethasone dipropionate 0.05 % cream - Hands  Other Related Medications Clobetasol Prop Emollient Base (CLOBETASOL PROPIONATE E) 0.05 % emollient cream  Inflamed seborrheic keratosis Left lateral thigh, right frontal scalp  Patient defers cryotherapy today.  Patient may use betamethasone to spot treat 1-2 times daily.   Return in about 6 months (around 08/17/2020) for psoriasis.  Graciella Belton, RMA, am acting as scribe for [name], MD  . Documentation: I have reviewed the above documentation for accuracy and completeness, and I agree with the above.  [name] MD

## 2020-02-15 NOTE — Patient Instructions (Addendum)
Recommend daily broad spectrum sunscreen SPF 30+ to sun-exposed areas, reapply every 2 hours as needed. Call for new or changing lesions.  Topical steroids (such as triamcinolone, fluocinolone, fluocinonide, mometasone, clobetasol, halobetasol, betamethasone, hydrocortisone) can cause thinning and lightening of the skin if they are used for too long in the same area. Your physician has selected the right strength medicine for your problem and area affected on the body. Please use your medication only as directed by your physician to prevent side effects.   Start tacrolimus to affected areas 1-2 times daily as needed for psoriasis.  May use betamethasone to more severe areas as needed for flares no more then 4 weeks. Avoid face, groin, underarms.   Patient may use betamethasone to spot treat spot at scalp and left thigh 1-2 times daily. If not improving may treat with cryotherapy in office.   Gentle Skin Care Guide  1. Bathe no more than once a day.  2. Avoid bathing in hot water  3. Use a mild soap like Dove, Vanicream, Cetaphil, CeraVe. Can use Lever 2000 or Cetaphil antibacterial soap  4. Use soap only where you need it. On most days, use it under your arms, between your legs, and on your feet. Let the water rinse other areas unless visibly dirty.  5. When you get out of the bath/shower, use a towel to gently blot your skin dry, don't rub it.  6. While your skin is still a little damp, apply a moisturizing cream such as Vanicream, CeraVe, Cetaphil, Eucerin, Sarna lotion or plain Vaseline Jelly. For hands apply Neutrogena [place] Hand Cream or Excipial Hand Cream.  7. Reapply moisturizer any time you start to itch or feel dry.  8. Sometimes using free and clear laundry detergents can be helpful. Fabric softener sheets should be avoided. Downy Free & Gentle liquid, or any liquid fabric softener that is free of dyes and perfumes, it acceptable to use  9. If your doctor has given you  prescription creams you may apply moisturizers over them

## 2020-02-22 ENCOUNTER — Other Ambulatory Visit: Payer: Self-pay

## 2020-02-22 MED ORDER — PIMECROLIMUS 1 % EX CREA: TOPICAL_CREAM | Freq: Two times a day (BID) | CUTANEOUS | 0 refills | Status: DC

## 2020-02-22 NOTE — Progress Notes (Signed)
Tacrolimus will cost patient over $100. I have sent in Pimecrolimus to have pharmacy run and give patient copay.

## 2020-05-29 ENCOUNTER — Other Ambulatory Visit: Payer: Self-pay | Admitting: Internal Medicine

## 2020-05-29 DIAGNOSIS — Z1231 Encounter for screening mammogram for malignant neoplasm of breast: Secondary | ICD-10-CM

## 2020-06-15 ENCOUNTER — Ambulatory Visit: Payer: Medicare Other

## 2020-07-05 ENCOUNTER — Other Ambulatory Visit: Payer: Self-pay

## 2020-07-05 ENCOUNTER — Ambulatory Visit
Admission: RE | Admit: 2020-07-05 | Discharge: 2020-07-05 | Disposition: A | Discharge status: Home / Self Care | Payer: Medicare Other | Source: Ambulatory Visit | Attending: Internal Medicine | Admitting: Internal Medicine

## 2020-07-05 DIAGNOSIS — Z1231 Encounter for screening mammogram for malignant neoplasm of breast: Secondary | ICD-10-CM | POA: Insufficient documentation

## 2020-08-21 ENCOUNTER — Ambulatory Visit (INDEPENDENT_AMBULATORY_CARE_PROVIDER_SITE_OTHER): Payer: Medicare Other | Admitting: Dermatology

## 2020-08-21 ENCOUNTER — Other Ambulatory Visit: Payer: Self-pay

## 2020-08-21 DIAGNOSIS — L409 Psoriasis, unspecified: Secondary | ICD-10-CM | POA: Diagnosis not present

## 2020-08-21 DIAGNOSIS — L820 Inflamed seborrheic keratosis: Secondary | ICD-10-CM | POA: Diagnosis not present

## 2020-08-21 MED ORDER — CALCIPOTRIENE 0.005 % EX CREA: TOPICAL_CREAM | CUTANEOUS | 2 refills | Status: DC

## 2020-08-21 MED ORDER — CLOBETASOL PROPIONATE 0.05 % EX CREA: TOPICAL_CREAM | CUTANEOUS | 1 refills | Status: DC

## 2020-08-21 NOTE — Patient Instructions (Addendum)
Cryotherapy Aftercare  . Wash gently with soap and water everyday.  (left thigh) . Apply Vaseline and Band-Aid daily until healed.   Start clobetasol cream and calcipotriene cream - Apply both twice a day to affected areas raised, scaly psoriasis spots. When improving and flattening, stop using clobetasol and continue calcipotriene cream or tacrolimus ointment. Avoid clobetasol on the face, groin, underarms.  Topical steroids (such as triamcinolone, fluocinolone, fluocinonide, mometasone, clobetasol, halobetasol, betamethasone, hydrocortisone) can cause thinning and lightening of the skin if they are used for too long in the same area. Your physician has selected the right strength medicine for your problem and area affected on the body. Please use your medication only as directed by your physician to prevent side effects.

## 2020-08-21 NOTE — Progress Notes (Signed)
   Follow-Up Visit   Subjective  Dawn Dickerson is a 87 y.o. female who presents for the following: Psoriasis (Trunk/extremities/hands. She uses betamethasone dipropionate cream as needed for flares. She was not able to use tacrolimus ointment because it made her hands really red and flared. She uses hand cream several times a day.).  She has a persistent scaly growth on left leg that doesn't clear with topical treatment.   The following portions of the chart were reviewed this encounter and updated as appropriate:       Review of Systems:  No other skin or systemic complaints except as noted in HPI or Assessment and Plan.  Objective  Well appearing patient in no apparent distress; mood and affect are within normal limits.  A focused examination was performed including face, hands, arms. Relevant physical exam findings are noted in the Assessment and Plan.  Objective  Legs, arms, hands, buttocks: Pink scaly patches and papules on thighs, bil hands, wrists, forearms, elbows, buttocks.  Objective  Left Lateral Thigh: Erythematous keratotic or waxy stuck-on papule    Assessment & Plan  Psoriasis Legs, arms, hands, buttocks  Chronic condition, not at goal Start clobetasol cream and calcipotriene cream Apply together to AAs psoriasis twice a day until improved. When improved, d/c clobetasol and continue calcipotriene or tacrolimus ointment. Avoid clobetasol on the face, groin, axilla.  Topical steroids (such as triamcinolone, fluocinolone, fluocinonide, mometasone, clobetasol, halobetasol, betamethasone, hydrocortisone) can cause thinning and lightening of the skin if they are used for too long in the same area. Your physician has selected the right strength medicine for your problem and area affected on the body. Please use your medication only as directed by your physician to prevent side effects.   Discussed Rutherford Nail, but patient prefers to try different topicals first.  Psoriasis is a  chronic non-curable, but treatable genetic/hereditary disease that may have other systemic features affecting other organ systems such as joints (Psoriatic Arthritis). It is associated with an increased risk of inflammatory bowel disease, heart disease, non-alcoholic fatty liver disease, and depression.     clobetasol cream (TEMOVATE) 0.05 % - Legs, arms, hands, buttocks  calcipotriene (DOVONOX) 0.005 % cream - Legs, arms, hands, buttocks  Other Related Medications Clobetasol Prop Emollient Base (CLOBETASOL PROPIONATE E) 0.05 % emollient cream tacrolimus (PROTOPIC) 0.1 % ointment betamethasone dipropionate 0.05 % cream  Inflamed seborrheic keratosis Left Lateral Thigh  Destruction of lesion - Left Lateral Thigh  Destruction method: cryotherapy   Informed consent: discussed and consent obtained   Lesion destroyed using liquid nitrogen: Yes   Region frozen until ice ball extended beyond lesion: Yes   Outcome: patient tolerated procedure well with no complications   Post-procedure details: wound care instructions given    Return in about 3 months (around 11/19/2020) for psoriasis.   IJamesetta Orleans, CMA, am acting as scribe for [name], MD .  Documentation: I have reviewed the above documentation for accuracy and completeness, and I agree with the above.  [name] MD

## 2020-09-28 ENCOUNTER — Other Ambulatory Visit: Payer: Self-pay

## 2020-09-28 DIAGNOSIS — L409 Psoriasis, unspecified: Secondary | ICD-10-CM

## 2020-09-28 MED ORDER — BETAMETHASONE DIPROPIONATE 0.05 % EX CREA: TOPICAL_CREAM | CUTANEOUS | 0 refills | Status: DC

## 2020-09-28 NOTE — Progress Notes (Signed)
RX RF per patients request for psoriasis flare.

## 2020-12-05 ENCOUNTER — Other Ambulatory Visit: Payer: Self-pay

## 2020-12-05 ENCOUNTER — Ambulatory Visit (INDEPENDENT_AMBULATORY_CARE_PROVIDER_SITE_OTHER): Payer: Medicare Other | Admitting: Dermatology

## 2020-12-05 DIAGNOSIS — L409 Psoriasis, unspecified: Secondary | ICD-10-CM

## 2020-12-05 DIAGNOSIS — L820 Inflamed seborrheic keratosis: Secondary | ICD-10-CM

## 2020-12-05 MED ORDER — CLOBETASOL PROPIONATE 0.05 % EX CREA: TOPICAL_CREAM | CUTANEOUS | 1 refills | Status: DC

## 2020-12-05 NOTE — Progress Notes (Signed)
   Follow-Up Visit   Subjective  Dawn Dickerson is a 87 y.o. female who presents for the following: 3 month follow up (Patient here today for follow up psoriasis. She currently is using clobetasol prop emollient base and calcipotriene 0.005 % cream. She reports areas have improved since last visit and using prescribed cream. ).  She also has an itchy growth on L thigh.   The following portions of the chart were reviewed this encounter and updated as appropriate:      Objective  Well appearing patient in no apparent distress; mood and affect are within normal limits.  A focused examination was performed including left lateral thigh, . Relevant physical exam findings are noted in the Assessment and Plan.  Objective  left lateral thigh x 1: Erythematous keratotic or waxy stuck-on papule or plaque.   Assessment & Plan  Psoriasis forearms, hands, thighs  Improving with topical treatment Will hold off on Otezla for now  Psoriasis is a chronic non-curable, but treatable genetic/hereditary disease that may have other systemic features affecting other organ systems such as joints (Psoriatic Arthritis). It is associated with an increased risk of inflammatory bowel disease, heart disease, non-alcoholic fatty liver disease, and depression.    Continue clobetasol 0.05 % cream as spot treat to affected areas of psoriasis 1 - 2 times a day until improved. Avoid face, groin, underarms.   Continue calcipotriene 0.005 % cream apply to aas on hands and body twice daily (safe for thin skin areas including backs of hands)   Reordered Medications clobetasol cream (TEMOVATE) 0.05 %  Other Related Medications Clobetasol Prop Emollient Base (CLOBETASOL PROPIONATE E) 0.05 % emollient cream tacrolimus (PROTOPIC) 0.1 % ointment calcipotriene (DOVONOX) 0.005 % cream betamethasone dipropionate 0.05 % cream  Inflamed seborrheic keratosis left lateral thigh x 1  Prior to procedure, discussed risks of  blister formation, small wound, skin dyspigmentation, or rare scar following cryotherapy.    Destruction of lesion - left lateral thigh x 1  Destruction method: cryotherapy   Informed consent: discussed and consent obtained   Lesion destroyed using liquid nitrogen: Yes   Region frozen until ice ball extended beyond lesion: Yes   Outcome: patient tolerated procedure well with no complications   Post-procedure details: wound care instructions given    Return in about 6 months (around 06/07/2021) for psoriasis follow up.  I, Ruthell Rummage, CMA, am acting as scribe for [name], MD.  Documentation: I have reviewed the above documentation for accuracy and completeness, and I agree with the above.  [name] MD

## 2020-12-05 NOTE — Patient Instructions (Addendum)
Continue calcipotriene 0.005 % cream apply to dry pink spots on hands and other affected psoriasis areas twice daily (safe for thin skin areas or hands)   Topical steroids (such as triamcinolone, fluocinolone, fluocinonide, mometasone, clobetasol, halobetasol, betamethasone, hydrocortisone) can cause thinning and lightening of the skin if they are used for too long in the same area. Your physician has selected the right strength medicine for your problem and area affected on the body. Please use your medication only as directed by your physician to prevent side effects.   Psoriasis is a chronic non-curable, but treatable genetic/hereditary disease that may have other systemic features affecting other organ systems such as joints (Psoriatic Arthritis). It is associated with an increased risk of inflammatory bowel disease, heart disease, non-alcoholic fatty liver disease, and depression.     If you have any questions or concerns for your doctor, please call our main line at [phone] and press option 4 to reach your doctor's medical assistant. If no one answers, please leave a voicemail as directed and we will return your call as soon as possible. Messages left after 4 pm will be answered the following business day.   You may also send [place] a message via [place]. We typically respond to MyChart messages within 1-2 business days.  For prescription refills, please ask your pharmacy to contact our office. Our fax number is [phone].  If you have an urgent issue when the clinic is closed that cannot wait until the next business day, you can page your doctor at the number below.    Please note that while we do our best to be available for urgent issues outside of office hours, we are not available 24/7.   If you have an urgent issue and are unable to reach [place], you may choose to seek medical care at your doctor's office, retail clinic, urgent care center, or emergency room.  If you have a medical  emergency, please immediately call 911 or go to the emergency department.  Pager Numbers  - Dr. Nehemiah Massed: [phone]  - Dr. Laurence Ferrari: [phone]  - Dr. Nicole Kindred: [phone]  In the event of inclement weather, please call our main line at [phone] for an update on the status of any delays or closures.  Dermatology Medication Tips: Please keep the boxes that topical medications come in in order to help keep track of the instructions about where and how to use these. Pharmacies typically print the medication instructions only on the boxes and not directly on the medication tubes.   If your medication is too expensive, please contact our office at [phone] option 4 or send [place] a message through [place].   We are unable to tell what your co-pay for medications will be in advance as this is different depending on your insurance coverage. However, we may be able to find a substitute medication at lower cost or fill out paperwork to get insurance to cover a needed medication.   If a prior authorization is required to get your medication covered by your insurance company, please allow [place] 1-2 business days to complete this process.  Drug prices often vary depending on where the prescription is filled and some pharmacies may offer cheaper prices.  The website www.goodrx.com contains coupons for medications through different pharmacies. The prices here do not account for what the cost may be with help from insurance (it may be cheaper with your insurance), but the website can give you the price if you did not use any insurance.  -  You can print the associated coupon and take it with your prescription to the pharmacy.  - You may also stop by our office during regular business hours and pick up a GoodRx coupon card.  - If you need your prescription sent electronically to a different pharmacy, notify our office through [facility] or by phone at [phone] option 4.

## 2021-05-14 ENCOUNTER — Other Ambulatory Visit: Payer: Self-pay

## 2021-05-14 DIAGNOSIS — L409 Psoriasis, unspecified: Secondary | ICD-10-CM

## 2021-05-14 MED ORDER — CLOBETASOL PROPIONATE 0.05 % EX CREA: TOPICAL_CREAM | CUTANEOUS | 1 refills | Status: DC

## 2021-06-11 ENCOUNTER — Ambulatory Visit (INDEPENDENT_AMBULATORY_CARE_PROVIDER_SITE_OTHER): Payer: Medicare Other | Admitting: Dermatology

## 2021-06-11 ENCOUNTER — Other Ambulatory Visit: Payer: Self-pay

## 2021-06-11 ENCOUNTER — Encounter: Payer: Self-pay | Admitting: Dermatology

## 2021-06-11 DIAGNOSIS — L570 Actinic keratosis: Secondary | ICD-10-CM

## 2021-06-11 DIAGNOSIS — Q825 Congenital non-neoplastic nevus: Secondary | ICD-10-CM

## 2021-06-11 DIAGNOSIS — L409 Psoriasis, unspecified: Secondary | ICD-10-CM | POA: Diagnosis not present

## 2021-06-11 DIAGNOSIS — L719 Rosacea, unspecified: Secondary | ICD-10-CM | POA: Diagnosis not present

## 2021-06-11 MED ORDER — CALCIPOTRIENE 0.005 % EX CREA: TOPICAL_CREAM | CUTANEOUS | 2 refills | Status: DC

## 2021-06-11 MED ORDER — CLOBETASOL PROPIONATE 0.05 % EX CREA: TOPICAL_CREAM | CUTANEOUS | 1 refills | Status: DC

## 2021-06-11 MED ORDER — METRONIDAZOLE 0.75 % EX CREA: TOPICAL_CREAM | CUTANEOUS | 2 refills | Status: DC

## 2021-06-11 NOTE — Progress Notes (Signed)
Follow-Up Visit   Subjective  Dawn Dickerson is a 87 y.o. female who presents for the following: Psoriasis (6 month follow-up. Thighs improved with clobetasol cream BID prn. She rarely has breakouts on arms and hands. ) and Spot (Right cheek x 1 week, rough and red. She applied Carmex to area and thinks it has improved some. ).   The following portions of the chart were reviewed this encounter and updated as appropriate:       Review of Systems:  No other skin or systemic complaints except as noted in HPI or Assessment and Plan.  Objective  Well appearing patient in no apparent distress; mood and affect are within normal limits.  A focused examination was performed including thighs, face. Relevant physical exam findings are noted in the Assessment and Plan.  thighs, elbows Light pink scaly macules on the thighs, lower legs, and bilateral elbows.  L wrist x 1 Pink scaly papule.   R medial cheek x 2, R paranasal x 1, L nasal dorsum x 1, L nasal tip x 1 (5) Pink scaly macules.  Right Forehead Violaceous papule.   Cheeks, nose Erythema of the right malar cheek and nose; small pink papules on the nose.   Assessment & Plan  Psoriasis thighs, elbows  Psoriasis is a chronic non-curable, but treatable genetic/hereditary disease that may have other systemic features affecting other organ systems such as joints (Psoriatic Arthritis). It is associated with an increased risk of inflammatory bowel disease, heart disease, non-alcoholic fatty liver disease, and depression.    Improved (not clear) with topical treatment.  Continue Clobetasol cream qd/bid AA prn 60g 1Rf. Avoid face, groin, axilla. Spot treat only until area clears.  Caution atrophy with long-term use.   Restart Calcipotriene cream 0.005% Apply to AA legs, arms qd/bid  calcipotriene (DOVONOX) 0.005 % cream - thighs, elbows Apply to affected areas psoriasis 1-2 times a day.  Related Medications clobetasol cream  (TEMOVATE) 0.05 % Spot treat to affected areas psoriasis 1-2 times a day until improved. Avoid face, groin, underarms.  Hypertrophic actinic keratosis L wrist x 1  Actinic keratoses are precancerous spots that appear secondary to cumulative UV radiation exposure/sun exposure over time. They are chronic with expected duration over 1 year. A portion of actinic keratoses will progress to squamous cell carcinoma of the skin. It is not possible to reliably predict which spots will progress to skin cancer and so treatment is recommended to prevent development of skin cancer.  Recommend daily broad spectrum sunscreen SPF 30+ to sun-exposed areas, reapply every 2 hours as needed.  Recommend staying in the shade or wearing long sleeves, sun glasses (UVA+UVB protection) and wide brim hats (4-inch brim around the entire circumference of the hat). Call for new or changing lesions.  Destruction of lesion - L wrist x 1  Destruction method: cryotherapy   Informed consent: discussed and consent obtained   Lesion destroyed using liquid nitrogen: Yes   Region frozen until ice ball extended beyond lesion: Yes   Outcome: patient tolerated procedure well with no complications   Post-procedure details: wound care instructions given   Additional details:  Prior to procedure, discussed risks of blister formation, small wound, skin dyspigmentation, or rare scar following cryotherapy. Recommend Vaseline ointment to treated areas while healing.   AK (actinic keratosis) (5) R medial cheek x 2, R paranasal x 1, L nasal dorsum x 1, L nasal tip x 1  Actinic keratoses are precancerous spots that appear secondary to cumulative  UV radiation exposure/sun exposure over time. They are chronic with expected duration over 1 year. A portion of actinic keratoses will progress to squamous cell carcinoma of the skin. It is not possible to reliably predict which spots will progress to skin cancer and so treatment is recommended to  prevent development of skin cancer.  Recommend daily broad spectrum sunscreen SPF 30+ to sun-exposed areas, reapply every 2 hours as needed.  Recommend staying in the shade or wearing long sleeves, sun glasses (UVA+UVB protection) and wide brim hats (4-inch brim around the entire circumference of the hat). Call for new or changing lesions.  Destruction of lesion - R medial cheek x 2, R paranasal x 1, L nasal dorsum x 1, L nasal tip x 1  Destruction method: cryotherapy   Informed consent: discussed and consent obtained   Lesion destroyed using liquid nitrogen: Yes   Region frozen until ice ball extended beyond lesion: Yes   Outcome: patient tolerated procedure well with no complications   Post-procedure details: wound care instructions given   Additional details:  Prior to procedure, discussed risks of blister formation, small wound, skin dyspigmentation, or rare scar following cryotherapy. Recommend Vaseline ointment to treated areas while healing.   Vascular birthmark Right Forehead  Benign, observe.    Rosacea Cheeks, nose  Rosacea is a chronic progressive skin condition usually affecting the face of adults, causing redness and/or acne bumps. It is treatable but not curable. It sometimes affects the eyes (ocular rosacea) as well. It may respond to topical and/or systemic medication and can flare with stress, sun exposure, alcohol, exercise and some foods.  Daily application of broad spectrum spf 30+ sunscreen to face is recommended to reduce flares.  Start metronidazole 0.75% cream Apply to cheeks and nose qd/bid dsp 45g 2Rf.   metroNIDAZOLE (METROCREAM) 0.75 % cream - Cheeks, nose Apply to cheeks and nose 1-2 times a day for rosacea.  Return in about 3 months (around 09/11/2021) for AKs, rosacea.  IJamesetta Orleans, CMA, am acting as scribe for [name], MD . Documentation: I have reviewed the above documentation for accuracy and completeness, and I agree with the  above.  [name] MD

## 2021-06-11 NOTE — Patient Instructions (Addendum)
Clobetasol Cream - Spot treat psoriasis on arms, legs 1-2 times a day until improved. Calcipotriene Cream 0.005% - Apply to areas of psoriasis on arms, legs 1-2 times a day. Metronidazole 0.75% cream - Apply to cheeks and nose 1-2 times a day for rosacea.   Topical steroids (such as triamcinolone, fluocinolone, fluocinonide, mometasone, clobetasol, halobetasol, betamethasone, hydrocortisone) can cause thinning and lightening of the skin if they are used for too long in the same area. Your physician has selected the right strength medicine for your problem and area affected on the body. Please use your medication only as directed by your physician to prevent side effects.    Rosacea  What is rosacea? Rosacea (say: ro-zay-sha) is a common skin disease that usually begins as a trend of flushing or blushing easily.  As rosacea progresses, a persistent redness in the center of the face will develop and may gradually spread beyond the nose and cheeks to the forehead and chin.  In some cases, the ears, chest, and back could be affected.  Rosacea may appear as tiny blood vessels or small red bumps that occur in crops.  Frequently they can contain pus, and are called "pustules".  If the bumps do not contain pus, they are referred to as "papules".  Rarely, in prolonged, untreated cases of rosacea, the oil glands of the nose and cheeks may become permanently enlarged.  This is called rhinophyma, and is seen more frequently in men.  Signs and Risks In its beginning stages, rosacea tends to come and go, which makes it difficult to recognize.  It can start as intermittent flushing of the face.  Eventually, blood vessels may become permanently visible.  Pustules and papules can appear, but can be mistaken for adult acne.  People of all races, ages, genders and ethnic groups are at risk of developing rosacea.  However, it is more common in women (especially around menopause) and adults with fair skin between the ages  of 33 and 69.  Treatment Dermatologists typically recommend a combination of treatments to effectively manage rosacea.  Treatment can improve symptoms and may stop the progression of the rosacea.  Treatment may involve both topical and oral medications.  The tetracycline antibiotics are often used for their anti-inflammatory effect; however, because of the possibility of developing antibiotic resistance, they should not be used long term at full dose.  For dilated blood vessels the options include electrodessication (uses electric current through a small needle), laser treatment, and cosmetics to hide the redness.   With all forms of treatment, improvement is a slow process, and patients may not see any results for the first 3-4 weeks.  It is very important to avoid the sun and other triggers.  Patients must wear sunscreen daily.  Skin Care Instructions: Cleanse the skin with a mild soap such as CeraVe cleanser, Cetaphil cleanser, or Dove soap once or twice daily as needed. Moisturize with Eucerin Redness Relief Daily Perfecting Lotion (has a subtle green tint), CeraVe Moisturizing Cream, or Oil of Olay Daily Moisturizer with sunscreen every morning and/or night as recommended. Makeup should be "non-comedogenic" (won't clog pores) and be labeled "for sensitive skin". Good choices for cosmetics are: Neutrogena, Almay, and Physician's Formula.  Any product with a green tint tends to offset a red complexion. If your eyes are dry and irritated, use artificial tears 2-3 times per day and cleanse the eyelids daily with baby shampoo.  Have your eyes examined at least every 2 years.  Be sure to  tell your eye doctor that you have rosacea. Alcoholic beverages tend to cause flushing of the skin, and may make rosacea worse. Always wear sunscreen, protect your skin from extreme hot and cold temperatures, and avoid spicy foods, hot drinks, and mechanical irritation such as rubbing, scrubbing, or massaging the face.   Avoid harsh skin cleansers, cleansing masks, astringents, and exfoliation. If a particular product burns or makes your face feel tight, then it is likely to flare your rosacea. If you are having difficulty finding a sunscreen that you can tolerate, you may try switching to a chemical-free sunscreen.  These are ones whose active ingredient is zinc oxide or titanium dioxide only.  They should also be fragrance free, non-comedogenic, and labeled for sensitive skin. Rosacea triggers may vary from person to person.  There are a variety of foods that have been reported to trigger rosacea.  Some patients find that keeping a diary of what they were doing when they flared helps them avoid triggers.  If you have any questions or concerns for your doctor, please call our main line at [phone] and press option 4 to reach your doctor's medical assistant. If no one answers, please leave a voicemail as directed and we will return your call as soon as possible. Messages left after 4 pm will be answered the following business day.   You may also send [place] a message via [place]. We typically respond to MyChart messages within 1-2 business days.  For prescription refills, please ask your pharmacy to contact our office. Our fax number is [phone].  If you have an urgent issue when the clinic is closed that cannot wait until the next business day, you can page your doctor at the number below.    Please note that while we do our best to be available for urgent issues outside of office hours, we are not available 24/7.   If you have an urgent issue and are unable to reach [place], you may choose to seek medical care at your doctor's office, retail clinic, urgent care center, or emergency room.  If you have a medical emergency, please immediately call 911 or go to the emergency department.  Pager Numbers  - Dr. Nehemiah Massed: [phone]  - Dr. Laurence Ferrari: [phone]  - Dr. Nicole Kindred: [phone]  In the event of  inclement weather, please call our main line at [phone] for an update on the status of any delays or closures.  Dermatology Medication Tips: Please keep the boxes that topical medications come in in order to help keep track of the instructions about where and how to use these. Pharmacies typically print the medication instructions only on the boxes and not directly on the medication tubes.   If your medication is too expensive, please contact our office at [phone] option 4 or send [place] a message through [place].   We are unable to tell what your co-pay for medications will be in advance as this is different depending on your insurance coverage. However, we may be able to find a substitute medication at lower cost or fill out paperwork to get insurance to cover a needed medication.   If a prior authorization is required to get your medication covered by your insurance company, please allow [place] 1-2 business days to complete this process.  Drug prices often vary depending on where the prescription is filled and some pharmacies may offer cheaper prices.  The website www.goodrx.com contains coupons for medications through different pharmacies. The prices here do not account for  what the cost may be with help from insurance (it may be cheaper with your insurance), but the website can give you the price if you did not use any insurance.  - You can print the associated coupon and take it with your prescription to the pharmacy.  - You may also stop by our office during regular business hours and pick up a GoodRx coupon card.  - If you need your prescription sent electronically to a different pharmacy, notify our office through [facility] or by phone at [phone] option 4.

## 2021-08-23 ENCOUNTER — Other Ambulatory Visit: Payer: Self-pay | Admitting: Internal Medicine

## 2021-08-23 DIAGNOSIS — Z1231 Encounter for screening mammogram for malignant neoplasm of breast: Secondary | ICD-10-CM

## 2021-09-18 ENCOUNTER — Other Ambulatory Visit: Payer: Self-pay

## 2021-09-18 ENCOUNTER — Ambulatory Visit (INDEPENDENT_AMBULATORY_CARE_PROVIDER_SITE_OTHER): Payer: Medicare Other | Admitting: Dermatology

## 2021-09-18 DIAGNOSIS — L409 Psoriasis, unspecified: Secondary | ICD-10-CM | POA: Diagnosis not present

## 2021-09-18 DIAGNOSIS — D1800 Hemangioma unspecified site: Secondary | ICD-10-CM

## 2021-09-18 DIAGNOSIS — Z872 Personal history of diseases of the skin and subcutaneous tissue: Secondary | ICD-10-CM | POA: Diagnosis not present

## 2021-09-18 DIAGNOSIS — L719 Rosacea, unspecified: Secondary | ICD-10-CM

## 2021-09-18 DIAGNOSIS — L821 Other seborrheic keratosis: Secondary | ICD-10-CM

## 2021-09-18 MED ORDER — BETAMETHASONE DIPROPIONATE 0.05 % EX CREA
TOPICAL_CREAM | CUTANEOUS | 1 refills | Status: DC
Start: 2021-09-18 — End: 2022-03-26

## 2021-09-18 NOTE — Patient Instructions (Signed)

## 2021-09-18 NOTE — Progress Notes (Signed)
Follow-Up Visit   Subjective  Dawn Dickerson is a 88 y.o. female who presents for the following: Actinic Keratosis (Face, 35m f/u), Rosacea (Face, metrocream 0.75% used until face cleared and then d/c), and Psoriasis (Legs, elbows, Clobetasol cr ~1x/wk, Calcipotriene cr prn).  The patient has spots, moles and lesions to be evaluated, some may be new or changing and the patient has concerns that these could be cancer.   The following portions of the chart were reviewed this encounter and updated as appropriate:       Review of Systems:  No other skin or systemic complaints except as noted in HPI or Assessment and Plan.  Objective  Well appearing patient in no apparent distress; mood and affect are within normal limits.  A focused examination was performed including face, legs, arms, back. Relevant physical exam findings are noted in the Assessment and Plan.  face Mild erythema nose  arms, legs, back Pink brown macules and patches bil thighs, small pick scaly papules lower back    Assessment & Plan   History of PreCancerous Actinic Keratosis  - site(s) of PreCancerous Actinic Keratosis clear today. - these may recur and new lesions may form requiring treatment to prevent transformation into skin cancer - observe for new or changing spots and contact [facility] for appointment if occur - photoprotection  Hemangiomas - Red papules - Discussed benign nature - Observe - Call for any changes with sun protective clothing; sunglasses and broad spectrum sunscreen with SPF of at least 30 + and frequent self skin exams recommended - yearly exams by a dermatologist recommended for persons with history of PreCancerous Actinic Keratoses   Seborrheic Keratoses - Stuck-on, waxy, tan-brown papules and/or plaques  - Benign-appearing - Discussed benign etiology and prognosis. - Observe - Call for any changes - back  Hemangiomas - Red papules - Discussed benign nature -  Observe - Call for any changes   Rosacea face  Chronic condition with duration or expected duration over one year. Currently well-controlled.   Rosacea is a chronic progressive skin condition usually affecting the face of adults, causing redness and/or acne bumps. It is treatable but not curable. It sometimes affects the eyes (ocular rosacea) as well. It may respond to topical and/or systemic medication and can flare with stress, sun exposure, alcohol, exercise and some foods.  Daily application of broad spectrum spf 30+ sunscreen to face is recommended to reduce flares.  Recommend using the Metronidazole cr 0.75% qhs to prevent flares  Related Medications metroNIDAZOLE (METROCREAM) 0.75 % cream Apply to cheeks and nose 1-2 times a day for rosacea.  Psoriasis arms, legs, back  Mild Psoriasis is a chronic non-curable, but treatable genetic/hereditary disease that may have other systemic features affecting other organ systems such as joints (Psoriatic Arthritis). It is associated with an increased risk of inflammatory bowel disease, heart disease, non-alcoholic fatty liver disease, and depression.     D/C Clobetasol cr (too expensive) Start Betamethasone Dipropionate cr qd aa arms, legs back until clear, then prn flares, avoid f/g/a, Caution atrophy with long-term use. Spot treat only May use Calcipotriene cr qd  Topical steroids (such as triamcinolone, fluocinolone, fluocinonide, mometasone, clobetasol, halobetasol, betamethasone, hydrocortisone) can cause thinning and lightening of the skin if they are used for too long in the same area. Your physician has selected the right strength medicine for your problem and area affected on the body. Please use your medication only as directed by your physician to prevent side  effects.    betamethasone dipropionate 0.05 % cream - arms, legs, back Apply topically as directed. Qd to aa psoriasis arms, legs, back until clear prn flares, avoid face,  groin, axilla  Related Medications calcipotriene (DOVONOX) 0.005 % cream Apply to affected areas psoriasis 1-2 times a day.   Return in about 6 months (around 03/18/2022) for Hx of AKs, Psoriasis f/u, Rosacea f/u.  I, Othelia Pulling, RMA, am acting as scribe for [name], MD .  Documentation: I have reviewed the above documentation for accuracy and completeness, and I agree with the above.  [name] MD

## 2021-10-03 ENCOUNTER — Ambulatory Visit
Admission: RE | Admit: 2021-10-03 | Discharge: 2021-10-03 | Disposition: A | Discharge status: Home / Self Care | Payer: Medicare Other | Source: Ambulatory Visit | Attending: Internal Medicine | Admitting: Internal Medicine

## 2021-10-03 ENCOUNTER — Other Ambulatory Visit: Payer: Self-pay

## 2021-10-03 DIAGNOSIS — Z1231 Encounter for screening mammogram for malignant neoplasm of breast: Secondary | ICD-10-CM | POA: Insufficient documentation

## 2021-11-14 ENCOUNTER — Emergency Department
Admission: EM | Admit: 2021-11-14 | Discharge: 2021-11-14 | Disposition: A | Discharge status: Home / Self Care | Payer: Medicare Other | Attending: Emergency Medicine | Admitting: Emergency Medicine

## 2021-11-14 ENCOUNTER — Other Ambulatory Visit: Payer: Self-pay

## 2021-11-14 ENCOUNTER — Emergency Department: Payer: Medicare Other

## 2021-11-14 DIAGNOSIS — Z733 Stress, not elsewhere classified: Secondary | ICD-10-CM | POA: Insufficient documentation

## 2021-11-14 DIAGNOSIS — R42 Dizziness and giddiness: Secondary | ICD-10-CM | POA: Diagnosis present

## 2021-11-14 DIAGNOSIS — R8279 Other abnormal findings on microbiological examination of urine: Secondary | ICD-10-CM | POA: Insufficient documentation

## 2021-11-14 DIAGNOSIS — F439 Reaction to severe stress, unspecified: Secondary | ICD-10-CM

## 2021-11-14 DIAGNOSIS — Z85828 Personal history of other malignant neoplasm of skin: Secondary | ICD-10-CM | POA: Insufficient documentation

## 2021-11-14 DIAGNOSIS — I10 Essential (primary) hypertension: Secondary | ICD-10-CM | POA: Diagnosis not present

## 2021-11-14 DIAGNOSIS — Z20822 Contact with and (suspected) exposure to covid-19: Secondary | ICD-10-CM | POA: Insufficient documentation

## 2021-11-14 LAB — COMPREHENSIVE METABOLIC PANEL
ALT: <5 U/L (ref 0–44)
AST: 23 U/L (ref 15–41)
Albumin: 4 g/dL (ref 3.5–5.0)
Alkaline Phosphatase: 53 U/L (ref 38–126)
Anion gap: 8 (ref 5–15)
BUN: 28 mg/dL — ABNORMAL HIGH (ref 8–23)
CO2: 25 mmol/L (ref 22–32)
Calcium: 9.3 mg/dL (ref 8.9–10.3)
Chloride: 104 mmol/L (ref 98–111)
Creatinine, Ser: 0.87 mg/dL (ref 0.44–1.00)
GFR, Estimated: >60 mL/min (ref >60)
Glucose, Bld: 107 mg/dL — ABNORMAL HIGH (ref 70–99)
Potassium: 4.1 mmol/L (ref 3.5–5.1)
Sodium: 137 mmol/L (ref 135–145)
Total Bilirubin: 0.7 mg/dL (ref 0.3–1.2)
Total Protein: 7 g/dL (ref 6.5–8.1)

## 2021-11-14 LAB — URINALYSIS, COMPLETE (UACMP) WITH MICROSCOPIC
Bilirubin Urine: NEGATIVE
Glucose, UA: NEGATIVE mg/dL
Hgb urine dipstick: NEGATIVE
Ketones, ur: NEGATIVE mg/dL
Nitrite: NEGATIVE
Protein, ur: NEGATIVE mg/dL
Specific Gravity, Urine: 1.006 (ref 1.005–1.030)
pH: 6 (ref 5.0–8.0)

## 2021-11-14 LAB — CBC WITH DIFFERENTIAL/PLATELET
Abs Immature Granulocytes: 0.02 10*3/uL (ref 0.00–0.07)
Basophils Absolute: 0.1 10*3/uL (ref 0.0–0.1)
Basophils Relative: 2 %
Eosinophils Absolute: 0.5 10*3/uL (ref 0.0–0.5)
Eosinophils Relative: 7 %
HCT: 40.9 % (ref 36.0–46.0)
Hemoglobin: 12.7 g/dL (ref 12.0–15.0)
Immature Granulocytes: 0 %
Lymphocytes Relative: 49 %
Lymphs Abs: 3 10*3/uL (ref 0.7–4.0)
MCH: 26.8 pg (ref 26.0–34.0)
MCHC: 31.1 g/dL (ref 30.0–36.0)
MCV: 86.5 fL (ref 80.0–100.0)
Monocytes Absolute: 0.6 10*3/uL (ref 0.1–1.0)
Monocytes Relative: 10 %
Neutro Abs: 2 10*3/uL (ref 1.7–7.7)
Neutrophils Relative %: 32 %
Platelets: 276 10*3/uL (ref 150–400)
RBC: 4.73 MIL/uL (ref 3.87–5.11)
RDW: 13.2 % (ref 11.5–15.5)
WBC: 6.2 10*3/uL (ref 4.0–10.5)
nRBC: 0 % (ref 0.0–0.2)

## 2021-11-14 LAB — TROPONIN I (HIGH SENSITIVITY)
Troponin I (High Sensitivity): 8 ng/L (ref <18)
Troponin I (High Sensitivity): 9 ng/L (ref <18)

## 2021-11-14 LAB — RESP PANEL BY RT-PCR (FLU A&B, COVID) ARPGX2
Influenza A by PCR: NEGATIVE
Influenza B by PCR: NEGATIVE
SARS Coronavirus 2 by RT PCR: NEGATIVE

## 2021-11-14 NOTE — ED Triage Notes (Signed)
pt called out for hypertension 190's/80s spo2 98% on RA, pt stated she has not been feeling"right" for a few days. [facility] BP was 208/80; pt stated she's felt stressed lately as well. ?

## 2021-11-14 NOTE — ED Notes (Signed)
Patient tearful with doctor right now patient stated she has been stressed lately and is scared for her health and said she separated from her husband a year ago. ?

## 2021-11-14 NOTE — ED Provider Notes (Signed)
? ?[facility] ?Provider Note ? ? ? Event Date/Time  ? First MD Initiated Contact with Patient 11/14/21 0243   ?  (approximate) ? ? ?History  ? ?Hypertension ? ? ?HPI ? ?Dawn Dickerson is a 88 y.o. female with a history of glaucoma, hyperlipidemia, pulmonary hypertension, aortic insufficiency who presents from home for hypertension.  Patient is teary and reports that she has been under a lot of stress lately. She reports separating from her husband one year ago and since then has been living alone. She reports that she worries a lot about being home alone and something happening to her.  Over the last 2 days she has not felt really well.  She reports that she has had some episodes of feeling dizzy that she describes as off-balance but no room spinning or feeling like she is going to faint. These episodes are very brief and she also describes feeling unwell.  She has been checking her blood pressure at home which has been elevated with systolics in the 629B.  She denies any prior history of hypertension.  She denies headache, diplopia, dysarthria, dysphagia, unilateral weakness or numbness, slurred speech, facial droop, difficulty finding words.  No prior history of stroke.  No chest pain, no shortness of breath, no body aches, fever, cough or congestion.  She denies dysuria or hematuria.  Patient lives alone and tells me that she is afraid that something may happen to her and she is alone in the house.  She does have a son who lives nearby and checks on her regularly. ?  ? ? ?Past Medical History:  ?Diagnosis Date  ? Actinic keratosis   ? Cancer [facility])   ? skin ca  ? Glaucoma   ? Hyperlipemia   ? Macular degeneration   ? Osteoporosis   ? Psoriasis   ? Pulmonary hypertension ([place])   ? Thyroid disease   ? ? ?Past Surgical History:  ?Procedure Laterality Date  ? BREAST BIOPSY Bilateral   ? neg  ? TONSILLECTOMY    ? ? ? ?Physical Exam  ? ?Triage Vital Signs: ?ED Triage Vitals  ?Enc Vitals Group  ?    BP 11/14/21 0250 (!) 206/79  ?   Pulse Rate 11/14/21 0250 77  ?   Resp 11/14/21 0250 18  ?   Temp --   ?   Temp src --   ?   SpO2 11/14/21 0250 100 %  ?   Weight 11/14/21 0247 154 lb 5.2 oz (70 kg)  ?   Height 11/14/21 0247 '5\' 6"'$  (1.676 m)  ?   Head Circumference --   ?   Peak Flow --   ?   Pain Score 11/14/21 0247 0  ?   Pain Loc --   ?   Pain Edu? --   ?   Excl. in [place]? --   ? ? ?Most recent vital signs: ?Vitals:  ? 11/14/21 0250 11/14/21 0430  ?BP: (!) 206/79 (!) 151/57  ?Pulse: 77 68  ?Resp: 18   ?SpO2: 100% 100%  ? ? ? ?Constitutional: Alert and oriented. Well appearing and in no apparent distress. ?HEENT: ?     Head: Normocephalic and atraumatic.    ?     Eyes: Conjunctivae are normal. Sclera is non-icteric.  ?     Mouth/Throat: Mucous membranes are moist.  ?     Neck: Supple with no signs of meningismus. ?Cardiovascular: Regular rate and rhythm. No murmurs, gallops, or rubs.  2+ symmetrical distal pulses are present in all extremities.  ?Respiratory: Normal respiratory effort. Lungs are clear to auscultation bilaterally.  ?Gastrointestinal: Soft, non tender, and non distended with positive bowel sounds. No rebound or guarding. ?Genitourinary: No CVA tenderness. ?Musculoskeletal:  No edema, cyanosis, or erythema of extremities. ?Neurologic: Normal speech and language. Face is symmetric. EOMI, PERRL, intact strength and sensation x4, no dysmetria, no pronator drift  ?skin: Skin is warm, dry and intact. No rash noted. ?Psychiatric: Mood and affect are normal. Speech and behavior are normal. ? ?ED Results / Procedures / Treatments  ? ?Labs ?(all labs ordered are listed, but only abnormal results are displayed) ?Labs Reviewed  ?COMPREHENSIVE METABOLIC PANEL - Abnormal; Notable for the following components:  ?    Result Value  ? Glucose, Bld 107 (*)   ? BUN 28 (*)   ? All other components within normal limits  ?URINALYSIS, COMPLETE (UACMP) WITH MICROSCOPIC - Abnormal; Notable for the following components:  ? Color,  Urine STRAW (*)   ? APPearance CLEAR (*)   ? Leukocytes,Ua MODERATE (*)   ? Bacteria, UA FEW (*)   ? All other components within normal limits  ?RESP PANEL BY RT-PCR (FLU A&B, COVID) [id number]  ?CBC WITH DIFFERENTIAL/PLATELET  ?TROPONIN I (HIGH SENSITIVITY)  ?TROPONIN I (HIGH SENSITIVITY)  ? ? ? ?EKG ? ?ED ECG REPORT ?I, Rudene Re, the attending physician, personally viewed and interpreted this ECG. ? ?Normal sinus rhythm with a rate of 75, LVH, normal intervals, left axis deviation, no ST elevations.  No prior for comparison ? ?RADIOLOGY ?I, Rudene Re, attending MD, have personally viewed and interpreted the images obtained during this visit as below: ? ?Head CT with no acute finding ? ? ?___________________________________________________ ?Interpretation by Radiologist:  ?CT Head Wo Contrast ? ?Result Date: 11/14/2021 ?CLINICAL DATA:  Dizziness. EXAM: CT HEAD WITHOUT CONTRAST TECHNIQUE: Contiguous axial images were obtained from the base of the skull through the vertex without intravenous contrast. RADIATION DOSE REDUCTION: This exam was performed according to the departmental dose-optimization program which includes automated exposure control, adjustment of the mA and/or kV according to patient size and/or use of iterative reconstruction technique. COMPARISON:  None. FINDINGS: Brain: Mild age-related atrophy and chronic microvascular ischemic changes. There is no acute intracranial hemorrhage. No mass effect or midline shift. No extra-axial fluid collection. Vascular: No hyperdense vessel or unexpected calcification. Skull: Normal. Negative for fracture or focal lesion. Sinuses/Orbits: No acute finding. Partially visualized punctate radiopaque focus in the medial left globe may represent a focus of calcification. Other: None IMPRESSION: 1. No acute intracranial pathology. 2. Mild age-related atrophy and chronic microvascular ischemic changes. Electronically Signed   By: Anner Crete M.D.   On:  11/14/2021 03:14   ? ? ? ? ?PROCEDURES: ? ?Critical Care performed: No ? ?Procedures ? ? ? ?IMPRESSION / MDM / ASSESSMENT AND PLAN / ED COURSE  ?I reviewed the triage vital signs and the nursing notes. ? ?88 y.o. female with a history of glaucoma, hyperlipidemia, pulmonary hypertension, aortic insufficiency who presents from home for hypertension.  Patient reports feeling unwell for 2 days, reports being under a lot of stress and being very worried.  Has noticed that her blood pressure has been elevated at home.  Denies any focal neurological deficits, headache or chest pain.  Does not take any medications for elevated blood pressure at home. ? ?Ddx: Anxiety versus stress versus stroke versus infection versus dehydration versus dysrhythmia versus cardiac ischemia ? ? ?Plan: EKG, cardiac telemetry  for monitoring of dysrhythmia, CBC, metabolic panel, urinalysis, head CT, troponin ? ? ?MEDICATIONS GIVEN IN ED: ?Medications - No data to display ? ? ?ED COURSE: EKG with no signs of dysrhythmias.  Patient was monitored on telemetry with no signs of dysrhythmias.  2 high-sensitivity troponins with no signs of demand ischemia.  Head CT is negative for any acute pathology.  UA has few bacteria and some leukocytes but no nitrites.  Patient denies any symptoms of a UTI therefore no antibiotics was given and urine was sent for culture.  No fever or leukocytosis.  No significant electrolyte derangements, stable kidney function.  COVID and flu negative.  Once patient was in the hospital and reassured her blood pressure improved significantly from 106 systolic to 269/48.  We did discuss getting a lifeline button to help her feel more protected at home if something were to happen.  Admission was considered but with negative work-up and blood pressure that has now improved significantly I feel the patient is stable for discharge home.  Recommended close follow-up with her primary care doctor.  I did encourage her to return at any  time if she continues to feel unwell, has any episodes of feeling lightheaded, room spinning, any signs of stroke, any chest pain, any shortness of breath, or any fever.  Patient's son is at bedside and f

## 2021-11-14 NOTE — Discharge Instructions (Addendum)
As we discussed, follow-up with your doctor within the week.  Return to the hospital for any further episodes of dizziness, headache, chest pain, shortness of breath, or any other symptoms concerning to you. ?

## 2021-11-16 LAB — URINE CULTURE: Culture: >=100000 — AB

## 2021-11-17 ENCOUNTER — Encounter: Payer: Self-pay | Admitting: *Deleted

## 2021-11-17 ENCOUNTER — Other Ambulatory Visit: Payer: Self-pay

## 2021-11-17 ENCOUNTER — Emergency Department
Admission: EM | Admit: 2021-11-17 | Discharge: 2021-11-17 | Disposition: A | Discharge status: Home / Self Care | Payer: Medicare Other | Attending: Emergency Medicine | Admitting: Emergency Medicine

## 2021-11-17 DIAGNOSIS — I10 Essential (primary) hypertension: Secondary | ICD-10-CM | POA: Diagnosis present

## 2021-11-17 LAB — CBC WITH DIFFERENTIAL/PLATELET
Abs Immature Granulocytes: 0.01 10*3/uL (ref 0.00–0.07)
Basophils Absolute: 0.1 10*3/uL (ref 0.0–0.1)
Basophils Relative: 2 %
Eosinophils Absolute: 0.2 10*3/uL (ref 0.0–0.5)
Eosinophils Relative: 5 %
HCT: 39.3 % (ref 36.0–46.0)
Hemoglobin: 12.6 g/dL (ref 12.0–15.0)
Immature Granulocytes: 0 %
Lymphocytes Relative: 30 %
Lymphs Abs: 1.5 10*3/uL (ref 0.7–4.0)
MCH: 27.4 pg (ref 26.0–34.0)
MCHC: 32.1 g/dL (ref 30.0–36.0)
MCV: 85.4 fL (ref 80.0–100.0)
Monocytes Absolute: 0.4 10*3/uL (ref 0.1–1.0)
Monocytes Relative: 8 %
Neutro Abs: 2.9 10*3/uL (ref 1.7–7.7)
Neutrophils Relative %: 55 %
Platelets: 255 10*3/uL (ref 150–400)
RBC: 4.6 MIL/uL (ref 3.87–5.11)
RDW: 13.2 % (ref 11.5–15.5)
WBC: 5.2 10*3/uL (ref 4.0–10.5)
nRBC: 0 % (ref 0.0–0.2)

## 2021-11-17 LAB — COMPREHENSIVE METABOLIC PANEL
ALT: <5 U/L (ref 0–44)
AST: 26 U/L (ref 15–41)
Albumin: 3.9 g/dL (ref 3.5–5.0)
Alkaline Phosphatase: 48 U/L (ref 38–126)
Anion gap: 6 (ref 5–15)
BUN: 18 mg/dL (ref 8–23)
CO2: 26 mmol/L (ref 22–32)
Calcium: 9.1 mg/dL (ref 8.9–10.3)
Chloride: 108 mmol/L (ref 98–111)
Creatinine, Ser: 0.82 mg/dL (ref 0.44–1.00)
GFR, Estimated: >60 mL/min (ref >60)
Glucose, Bld: 128 mg/dL — ABNORMAL HIGH (ref 70–99)
Potassium: 4 mmol/L (ref 3.5–5.1)
Sodium: 140 mmol/L (ref 135–145)
Total Bilirubin: 0.8 mg/dL (ref 0.3–1.2)
Total Protein: 6.7 g/dL (ref 6.5–8.1)

## 2021-11-17 LAB — TROPONIN I (HIGH SENSITIVITY): Troponin I (High Sensitivity): 8 ng/L (ref <18)

## 2021-11-17 MED ORDER — LOSARTAN POTASSIUM 25 MG PO TABS: 25.0000 mg | ORAL_TABLET | Freq: Every day | ORAL | 0 refills | Status: DC

## 2021-11-17 MED ORDER — LOSARTAN POTASSIUM 50 MG PO TABS
25.0000 mg | ORAL_TABLET | Freq: Once | ORAL | Status: AC
  Administered 2021-11-17: 25 mg via ORAL
  Filled 2021-11-17: qty 1

## 2021-11-17 NOTE — ED Triage Notes (Signed)
Per EMS report, Patient reported high B/P today that was over 419 systolic.EMT got a number of 206/76. Patient had a similar episode last week and was seen in the ED and discharged and then seen by PMD who gave no new med orders due to blood pressure being normal at that time. Patient c/o mild "heaviness" in back of head, but no other discomfort. ?

## 2021-11-17 NOTE — ED Notes (Signed)
Blood draw from right [facility], specimens sent to lab. Patient tolerated procedure well. ?

## 2021-11-17 NOTE — ED Notes (Signed)
Dc ppw provided. Pt denies any questions at this time and provides verbal consent for dc and rx information reviewed. Pt assisted to lobby via wheelchair [id number] ?

## 2021-11-17 NOTE — ED Provider Notes (Signed)
? ?[facility] ?Provider Note ? ? Event Date/Time  ? First MD Initiated Contact with Patient 11/17/21 1149   ?  (approximate) ?History  ?Hypertension ? ?HPI ?Dawn Dickerson is a 88 y.o. female with a past medical history of pulmonary hypertension, thyroid disease, psoriasis, and hyperlipidemia who presents for hypertension via EMS.  Patient states that she was seen here recently and evaluated for similar episode of hypertension however when she followed up in her primary care physician's office, she was told that her blood pressure was normal and this did not require any treatment.  Patient states that she took her blood pressure today and found it to be in the 742V systolic.  EMS states that they got a 206/76 reading however this was different in either arm however they do not remember what the difference was.  Patient only complains of a mild heaviness at the back of her head and top of her neck but denies any chest pain, shortness of breath, lightheadedness, changes, slurred speech or weakness/numbness/paresthesias in any extremity ?Physical Exam  ?Triage Vital Signs: ?ED Triage Vitals  ?Enc Vitals Group  ?   BP 11/17/21 1151 (!) 196/63  ?   Pulse Rate 11/17/21 1151 72  ?   Resp --   ?   Temp 11/17/21 1151 98.2 ?F (36.8 ?C)  ?   Temp Source 11/17/21 1151 Oral  ?   SpO2 11/17/21 1151 97 %  ?   Weight 11/17/21 1154 139 lb (63 kg)  ?   Height 11/17/21 1154 '5\' 6"'$  (1.676 m)  ?   Head Circumference --   ?   Peak Flow --   ?   Pain Score 11/17/21 1154 0  ?   Pain Loc --   ?   Pain Edu? --   ?   Excl. in [place]? --   ? ?Most recent vital signs: ?Vitals:  ? 11/17/21 1230 11/17/21 1300  ?BP: (!) 169/63 (!) 179/53  ?Pulse: 66 64  ?Resp: (!) 8 10  ?Temp:    ?SpO2: 97% 98%  ? ?General: Awake, oriented x4. ?CV:  Good peripheral perfusion.  ?Resp:  Normal effort.  ?Abd:  No distention.  ?Other:  Elderly Caucasian female laying in bed in no distress ?ED Results / Procedures / Treatments  ?Labs ?(all labs  ordered are listed, but only abnormal results are displayed) ?Labs Reviewed  ?COMPREHENSIVE METABOLIC PANEL - Abnormal; Notable for the following components:  ?    Result Value  ? Glucose, Bld 128 (*)   ? All other components within normal limits  ?CBC WITH DIFFERENTIAL/PLATELET  ?TROPONIN I (HIGH SENSITIVITY)  ?TROPONIN I (HIGH SENSITIVITY)  ? ?EKG ?ED ECG REPORT ?I, Naaman Plummer, the attending physician, personally viewed and interpreted this ECG. ?Date: 11/17/2021 ?EKG Time: 1152 ?Rate: 66 ?Rhythm: normal sinus rhythm ?QRS Axis: normal ?Intervals: normal ?ST/T Wave abnormalities: normal ?Narrative Interpretation: no evidence of acute ischemia ? ?PROCEDURES: ?Critical Care performed: No ?.1-3 Lead EKG Interpretation ?Performed by: Naaman Plummer, MD ?Authorized by: Naaman Plummer, MD  ? ?  Interpretation: normal   ?  ECG rate:  65 ?  ECG rate assessment: normal   ?  Rhythm: sinus rhythm   ?  Ectopy: none   ?  Conduction: normal   ?MEDICATIONS ORDERED IN ED: ?Medications  ?losartan (COZAAR) tablet 25 mg (has no administration in time range)  ? ?IMPRESSION / MDM / ASSESSMENT AND PLAN / ED COURSE  ?I reviewed the  triage vital signs and the nursing notes. ?             ?               ? ?Presents to the emergency department complaining of high blood pressure. ?Patient is otherwise asymptomatic without confusion, chest pain, hematuria, or SOB. ?DDx: CV, AMI, heart failure, renal infarction or failure or other end organ damage. ?Disposition: Discussed with patient their elevated blood pressure and need for close outpatient management of their hypertension. Will provide a prescription for losartan '25mg'$  PO daily and arrange for the patient to follow up in a primary care clinic ? ?  ?FINAL CLINICAL IMPRESSION(S) / ED DIAGNOSES  ? ?Final diagnoses:  ?Primary hypertension  ? ?Rx / DC Orders  ? ?ED Discharge Orders   ? ?      Ordered  ?  losartan (COZAAR) 25 MG tablet  Daily       ? 11/17/21 1321  ? ?  ?  ? ?  ? ?Note:   This document was prepared using Dragon voice recognition software and may include unintentional dictation errors. ?  ?Naaman Plummer, MD ?11/17/21 1323 ? ?

## 2022-01-24 ENCOUNTER — Encounter (INDEPENDENT_AMBULATORY_CARE_PROVIDER_SITE_OTHER): Payer: Self-pay | Admitting: Vascular Surgery

## 2022-01-24 ENCOUNTER — Ambulatory Visit (INDEPENDENT_AMBULATORY_CARE_PROVIDER_SITE_OTHER): Payer: Self-pay | Admitting: Vascular Surgery

## 2022-01-24 ENCOUNTER — Ambulatory Visit (INDEPENDENT_AMBULATORY_CARE_PROVIDER_SITE_OTHER): Payer: Medicare Other

## 2022-01-24 ENCOUNTER — Other Ambulatory Visit (INDEPENDENT_AMBULATORY_CARE_PROVIDER_SITE_OTHER): Payer: Self-pay | Admitting: Family Medicine

## 2022-01-24 DIAGNOSIS — I10 Essential (primary) hypertension: Secondary | ICD-10-CM | POA: Diagnosis not present

## 2022-01-24 DIAGNOSIS — E782 Mixed hyperlipidemia: Secondary | ICD-10-CM

## 2022-01-24 DIAGNOSIS — E039 Hypothyroidism, unspecified: Secondary | ICD-10-CM

## 2022-01-24 NOTE — Progress Notes (Signed)
MRN : [id number]  Dawn Dickerson is a 88 y.o. (06-Apr-1932) female who presents with chief complaint of check circulation because of hypertension.  History of Present Illness:   The patient is seen for evaluation of malignant hypertension which has been very difficult to control. The patient has a long history of hypertension which recently has become increasingly difficult to control utilizing medical therapy. The patient has consistently documented systolic blood pressures near 119 with diastolic pressures over 90.   The patient does have family history of hypertension.   There is no prior documented abdominal bruit. The patient occasionally has flushing symptoms but denies palpitations. No episodes of syncope.There is no history of headache. There is no history of flash pulmonary edema.  The patient denies a history of renal disease.  No recent shortening of the patient's walking distance or new symptoms consistent with claudication.  No history of rest pain symptoms. No new ulcers or wounds of the lower extremities have occurred.  The patient denies amaurosis fugax or recent TIA symptoms. There are no recent neurological changes noted. There is no history of DVT, PE or superficial thrombophlebitis. No recent episodes of angina or shortness of breath documented.   Duplex ultrasound of the renal arteries demonstrates normal flow pattern no evidence of a hemodynamically significant renal artery stenosis.   Of note the right kidney is reduced in size.  Current Meds  Medication Sig   aspirin EC 81 MG tablet Take by mouth.   betamethasone dipropionate 0.05 % cream Apply topically as directed. Qd to aa psoriasis arms, legs, back until clear prn flares, avoid face, groin, axilla   Calcium 600-400 MG-UNIT CHEW Chew by mouth.   clobetasol cream (TEMOVATE) 4.17 % Apply 1 Application topically 2 (two) times daily.   levothyroxine (SYNTHROID, LEVOTHROID) 175 MCG tablet Take  175 mcg by mouth daily before breakfast.   Multiple Vitamin (MULTIVITAMIN) tablet Take 1 tablet by mouth daily.   Multiple Vitamins-Minerals (PRESERVISION AREDS 2+MULTI VIT PO) Take by mouth.   simvastatin (ZOCOR) 20 MG tablet Take 20 mg by mouth daily.   timolol (BETIMOL) 0.5 % ophthalmic solution Place 1 drop into both eyes 2 (two) times daily.    Past Medical History:  Diagnosis Date   Actinic keratosis    Cancer ([place])    skin ca   Glaucoma    Hyperlipemia    Macular degeneration    Osteoporosis    Psoriasis    Pulmonary hypertension ([facility])    Thyroid disease     Past Surgical History:  Procedure Laterality Date   BREAST BIOPSY Bilateral    neg   TONSILLECTOMY      Social History Social History   Tobacco Use   Smoking status: Never   Smokeless tobacco: Never  Substance Use Topics   Alcohol use: No   Drug use: No    Family History Family History  Problem Relation Age of Onset   Hyperlipidemia Mother    Coronary artery disease Mother    Breast cancer Neg Hx     Allergies  Allergen Reactions   Methotrexate Derivatives Shortness Of Breath   Prednisone Other (See Comments)    Patient called and left message she could not take    Alendronate Diarrhea     REVIEW OF SYSTEMS (Negative unless checked)  Constitutional: '[]'$ Weight loss  '[]'$ Fever  '[]'$ Chills Cardiac: '[]'$ Chest pain   '[]'$ Chest pressure   '[]'$   Palpitations   '[]'$ Shortness of breath when laying flat   '[]'$ Shortness of breath with exertion. Vascular:  '[x]'$ Pain in legs with walking   '[]'$ Pain in legs at rest  '[]'$ History of DVT   '[]'$ Phlebitis   '[]'$ Swelling in legs   '[]'$ Varicose veins   '[]'$ Non-healing ulcers Pulmonary:   '[]'$ Uses home oxygen   '[]'$ Productive cough   '[]'$ Hemoptysis   '[]'$ Wheeze  '[]'$ COPD   '[]'$ Asthma Neurologic:  '[]'$ Dizziness   '[]'$ Seizures   '[]'$ History of stroke   '[]'$ History of TIA  '[]'$ Aphasia   '[]'$ Vissual changes   '[]'$ Weakness or numbness in arm   '[]'$ Weakness or numbness in leg Musculoskeletal:   '[]'$ Joint swelling   '[]'$ Joint pain    '[]'$ Low back pain Hematologic:  '[]'$ Easy bruising  '[]'$ Easy bleeding   '[]'$ Hypercoagulable state   '[]'$ Anemic Gastrointestinal:  '[]'$ Diarrhea   '[]'$ Vomiting  '[]'$ Gastroesophageal reflux/heartburn   '[]'$ Difficulty swallowing. Genitourinary:  '[]'$ Chronic kidney disease   '[]'$ Difficult urination  '[]'$ Frequent urination   '[]'$ Blood in urine Skin:  '[]'$ Rashes   '[]'$ Ulcers  Psychological:  '[]'$ History of anxiety   '[]'$  History of major depression.  Physical Examination  Vitals:   01/24/22 0925  BP: (!) 193/72  Pulse: 62  Weight: 144 lb (65.3 kg)  Height: '5\' 5"'$  (1.651 m)   Body mass index is 23.96 kg/m. Gen: WD/WN, NAD Head: [place]/AT, No temporalis wasting.  Ear/Nose/Throat: Hearing grossly intact, nares w/o erythema or drainage Eyes: PER, EOMI, sclera nonicteric.  Neck: Supple, no masses.  No bruit or JVD.  Pulmonary:  Good air movement, no audible wheezing, no use of accessory muscles.  Cardiac: RRR, normal S1, S2, no Murmurs. Vascular:  no abdominal or flank bruits; no carotid bruits Vessel Right Left  Radial Palpable Palpable  PT Palpable  Palpable  DP  Palpable Palpable  Gastrointestinal: soft, non-distended. No guarding/no peritoneal signs.  Musculoskeletal: M/S 5/5 throughout.  No visible deformity.  Neurologic: CN 2-12 intact. Pain and light touch intact in extremities.  Symmetrical.  Speech is fluent. Motor exam as listed above. Psychiatric: Judgment intact, Mood & affect appropriate for pt's clinical situation. Dermatologic: No rashes or ulcers noted.  No changes consistent with cellulitis.   CBC Lab Results  Component Value Date   WBC 5.2 11/17/2021   HGB 12.6 11/17/2021   HCT 39.3 11/17/2021   MCV 85.4 11/17/2021   PLT 255 11/17/2021    BMET    Component Value Date/Time   NA 140 11/17/2021 1211   NA 142 01/12/2014 1504   K 4.0 11/17/2021 1211   K 4.2 01/12/2014 1504   CL 108 11/17/2021 1211   CL 104 01/12/2014 1504   CO2 26 11/17/2021 1211   CO2 31 01/12/2014 1504   GLUCOSE 128 (H)  11/17/2021 1211   GLUCOSE 83 01/12/2014 1504   BUN 18 11/17/2021 1211   BUN 14 01/12/2014 1504   CREATININE 0.82 11/17/2021 1211   CREATININE 0.91 01/12/2014 1504   CALCIUM 9.1 11/17/2021 1211   CALCIUM 9.4 01/12/2014 1504   GFRNONAA >60 11/17/2021 1211   GFRNONAA 60 (L) 01/12/2014 1504   GFRAA >60 01/12/2014 1504   CrCl cannot be calculated (Patient's most recent lab result is older than the maximum 21 days allowed.).  COAG No results found for: "INR", "PROTIME"  Radiology No results found.   Assessment/Plan 1. Accelerated hypertension Recommend:  Given patient's arterial disease optimal control of the patient's hypertension is important. BP is acceptable today  The patient's vital signs and noninvasive studies do not support hemodynamically significant renal artery stenosis.  The reduce kidney size may indicate intrinsic renal disease that may explain her recent change in her hypertension.  If a CT scan were done then IV contrast should be used and this would serve to verify the patency of the renal arteries as well as investigate the right kidney.  At this point I defer further workup to the medical service or perhaps nephrology.  No invasive studies or intervention is indicated at this time.  The patient will continue the current antihypertensive medications, no changes at this time.  The primary medical service will continue aggressive antihypertensive therapy as per the AHA guidelines    2. Mixed hyperlipidemia Continue statin as ordered and reviewed, no changes at this time   3. Hypothyroidism, unspecified type Continue hormone replacement as ordered and reviewed, no changes at this time     Hortencia Pilar, MD  01/24/2022 9:38 AM

## 2022-01-27 ENCOUNTER — Encounter (INDEPENDENT_AMBULATORY_CARE_PROVIDER_SITE_OTHER): Payer: Self-pay | Admitting: Vascular Surgery

## 2022-01-27 DIAGNOSIS — E785 Hyperlipidemia, unspecified: Secondary | ICD-10-CM | POA: Insufficient documentation

## 2022-01-27 DIAGNOSIS — I10 Essential (primary) hypertension: Secondary | ICD-10-CM | POA: Insufficient documentation

## 2022-01-27 DIAGNOSIS — E039 Hypothyroidism, unspecified: Secondary | ICD-10-CM | POA: Insufficient documentation

## 2022-03-26 ENCOUNTER — Ambulatory Visit (INDEPENDENT_AMBULATORY_CARE_PROVIDER_SITE_OTHER): Payer: Medicare Other | Admitting: Dermatology

## 2022-03-26 ENCOUNTER — Encounter: Payer: Self-pay | Admitting: Dermatology

## 2022-03-26 DIAGNOSIS — L409 Psoriasis, unspecified: Secondary | ICD-10-CM

## 2022-03-26 DIAGNOSIS — L821 Other seborrheic keratosis: Secondary | ICD-10-CM

## 2022-03-26 DIAGNOSIS — L570 Actinic keratosis: Secondary | ICD-10-CM

## 2022-03-26 DIAGNOSIS — L578 Other skin changes due to chronic exposure to nonionizing radiation: Secondary | ICD-10-CM | POA: Diagnosis not present

## 2022-03-26 DIAGNOSIS — L820 Inflamed seborrheic keratosis: Secondary | ICD-10-CM

## 2022-03-26 DIAGNOSIS — D229 Melanocytic nevi, unspecified: Secondary | ICD-10-CM

## 2022-03-26 DIAGNOSIS — Q825 Congenital non-neoplastic nevus: Secondary | ICD-10-CM | POA: Diagnosis not present

## 2022-03-26 DIAGNOSIS — L719 Rosacea, unspecified: Secondary | ICD-10-CM | POA: Diagnosis not present

## 2022-03-26 DIAGNOSIS — L814 Other melanin hyperpigmentation: Secondary | ICD-10-CM

## 2022-03-26 DIAGNOSIS — D1800 Hemangioma unspecified site: Secondary | ICD-10-CM

## 2022-03-26 MED ORDER — CLOBETASOL PROPIONATE 0.05 % EX CREA: TOPICAL_CREAM | CUTANEOUS | 2 refills | Status: DC

## 2022-03-26 NOTE — Patient Instructions (Addendum)
Metronidazole Cream - Restart and apply to nose twice a day for rosacea.   Clobetasol cream - Spot treat psoriasis once to twice daily until improved. Avoid face, groin, underarms.  Topical steroids (such as triamcinolone, fluocinolone, fluocinonide, mometasone, clobetasol, halobetasol, betamethasone, hydrocortisone) can cause thinning and lightening of the skin if they are used for too long in the same area. Your physician has selected the right strength medicine for your problem and area affected on the body. Please use your medication only as directed by your physician to prevent side effects.    Cryotherapy Aftercare  Wash gently with soap and water everyday.   Apply Vaseline and Band-Aid daily until healed.   Due to recent changes in healthcare laws, you may see results of your pathology and/or laboratory studies on MyChart before the doctors have had a chance to review them. We understand that in some cases there may be results that are confusing or concerning to you. Please understand that not all results are received at the same time and often the doctors may need to interpret multiple results in order to provide you with the best plan of care or course of treatment. Therefore, we ask that you please give [place] 2 business days to thoroughly review all your results before contacting the office for clarification. Should we see a critical lab result, you will be contacted sooner.   If You Need Anything After Your Visit  If you have any questions or concerns for your doctor, please call our main line at [phone] and press option 4 to reach your doctor's medical assistant. If no one answers, please leave a voicemail as directed and we will return your call as soon as possible. Messages left after 4 pm will be answered the following business day.   You may also send [place] a message via [place]. We typically respond to MyChart messages within 1-2 business days.  For prescription refills, please  ask your pharmacy to contact our office. Our fax number is [phone].  If you have an urgent issue when the clinic is closed that cannot wait until the next business day, you can page your doctor at the number below.    Please note that while we do our best to be available for urgent issues outside of office hours, we are not available 24/7.   If you have an urgent issue and are unable to reach [place], you may choose to seek medical care at your doctor's office, retail clinic, urgent care center, or emergency room.  If you have a medical emergency, please immediately call 911 or go to the emergency department.  Pager Numbers  - Dr. Nehemiah Massed: [phone]  - Dr. Laurence Ferrari: [phone]  - Dr. Nicole Kindred: [phone]  In the event of inclement weather, please call our main line at [phone] for an update on the status of any delays or closures.  Dermatology Medication Tips: Please keep the boxes that topical medications come in in order to help keep track of the instructions about where and how to use these. Pharmacies typically print the medication instructions only on the boxes and not directly on the medication tubes.   If your medication is too expensive, please contact our office at [phone] option 4 or send [place] a message through [place].   We are unable to tell what your co-pay for medications will be in advance as this is different depending on your insurance coverage. However, we may be able to find a substitute medication at lower cost or  fill out paperwork to get insurance to cover a needed medication.   If a prior authorization is required to get your medication covered by your insurance company, please allow [place] 1-2 business days to complete this process.  Drug prices often vary depending on where the prescription is filled and some pharmacies may offer cheaper prices.  The website www.goodrx.com contains coupons for medications through different pharmacies. The prices here do  not account for what the cost may be with help from insurance (it may be cheaper with your insurance), but the website can give you the price if you did not use any insurance.  - You can print the associated coupon and take it with your prescription to the pharmacy.  - You may also stop by our office during regular business hours and pick up a GoodRx coupon card.  - If you need your prescription sent electronically to a different pharmacy, notify our office through [facility] or by phone at [phone] option 4.     Si Usted Necesita Algo Despus de Su Visita  Tambin puede enviarnos un mensaje a travs de Pharmacist, community. Por lo general respondemos a los mensajes de MyChart en el transcurso de 1 a 2 das hbiles.  Para renovar recetas, por favor pida a su farmacia que se ponga en contacto con nuestra oficina. Harland Dingwall de fax es [place] [phone].  Si tiene un asunto urgente cuando la clnica est cerrada y que no puede esperar hasta el siguiente da hbil, puede llamar/localizar a su doctor(a) al nmero que aparece a continuacin.   Por favor, tenga en cuenta que aunque hacemos todo lo posible para estar disponibles para asuntos urgentes fuera del horario de [place], no estamos disponibles las 24 horas del da, los 7 das de la [place].   Si tiene un problema urgente y no puede comunicarse con nosotros, puede optar por buscar atencin mdica  en el consultorio de su doctor(a), en una clnica privada, en un centro de atencin urgente o en una sala de emergencias.  Si tiene Engineering geologist, por favor llame inmediatamente al 911 o vaya a la sala de emergencias.  Nmeros de bper  - Dr. Nehemiah Massed: [phone]  - Dra. Moye: [phone]  - Dra. Nicole Kindred: [phone]  En caso de inclemencias del [place], por favor llame a Johnsie Kindred principal al [phone] para una actualizacin sobre el [place] de cualquier retraso o cierre.  Consejos para la medicacin en dermatologa: Por  favor, guarde las cajas en las que vienen los medicamentos de uso tpico para ayudarle a seguir las instrucciones sobre dnde y cmo usarlos. Las farmacias generalmente imprimen las instrucciones del medicamento slo en las cajas y no directamente en los tubos del [place].   Si su medicamento es muy caro, por favor, pngase en contacto con Zigmund Daniel llamando al [phone] y presione la opcin 4 o envenos un mensaje a travs de Pharmacist, community.   No podemos decirle cul ser su copago por los medicamentos por adelantado ya que esto es diferente dependiendo de la cobertura de su seguro. Sin embargo, es posible que podamos encontrar un medicamento sustituto a Electrical engineer un formulario para que el seguro cubra el medicamento que se considera necesario.   Si se requiere una autorizacin previa para que su compaa de seguros [place] su medicamento, por favor permtanos de 1 a 2 das hbiles para completar este proceso.  Los precios de los medicamentos varan con frecuencia dependiendo del Environmental consultant de dnde se surte la receta  y [place] farmacias pueden ofrecer precios ms baratos.  El sitio web www.goodrx.com tiene cupones para medicamentos de Airline pilot. Los precios aqu no tienen en cuenta lo que podra costar con la ayuda del seguro (puede ser ms barato con su seguro), pero el sitio web puede darle el precio si no utiliz Research scientist (physical sciences).  - Puede imprimir el cupn correspondiente y llevarlo con su receta a la farmacia.  - Tambin puede pasar por nuestra oficina durante el horario de atencin regular y Charity fundraiser una tarjeta de cupones de GoodRx.  - Si necesita que su receta se enve electrnicamente a una farmacia diferente, informe a nuestra oficina a travs de MyChart de [place] o por telfono llamando al [phone] y presione la opcin 4.

## 2022-03-26 NOTE — Progress Notes (Signed)
Follow-Up Visit   Subjective  Dawn Dickerson is a 88 y.o. female who presents for the following: Follow-up.  Patient presents for 6 month follow-up. Psoriasis is controlled with clobetasol cream. She tried betamethasone dipropionate, but it wasn't helping. She has rosacea of the face, but hasn't used metronidazole cream lately. She also has a history of Aks.  The following portions of the chart were reviewed this encounter and updated as appropriate:       Review of Systems:  No other skin or systemic complaints except as noted in HPI or Assessment and Plan.  Objective  Well appearing patient in no apparent distress; mood and affect are within normal limits.  A focused examination was performed including face. Relevant physical exam findings are noted in the Assessment and Plan.  Nose Erythema and edema of the right nasal tip.  L nasal tip x 1, L hand x 6, L forearm x 6, L upper arm x 2, R forearm x 7, R upper arm x 2, R hand dorsum x 3 (27) Pink scaly macules and papules.  Right Forehead Violaceous papule  Left Neck Erythematous stuck-on, waxy papule  arms, legs, back Pink scaly papules on the elbows; scattered pink scaly papules on bilateral thighs.    Assessment & Plan  Actinic Damage - chronic, secondary to cumulative UV radiation exposure/sun exposure over time - diffuse scaly erythematous macules with underlying dyspigmentation - Recommend daily broad spectrum sunscreen SPF 30+ to sun-exposed areas, reapply every 2 hours as needed.  - Recommend staying in the shade or wearing long sleeves, sun glasses (UVA+UVB protection) and wide brim hats (4-inch brim around the entire circumference of the hat). - Call for new or changing lesions.  Hemangiomas - Red papules - Discussed benign nature - Observe - Call for any changes  Melanocytic Nevi - Tan-brown and/or pink-flesh-colored symmetric macules and papules - Benign appearing on exam today - Observation - Call  clinic for new or changing moles - Recommend daily use of broad spectrum spf 30+ sunscreen to sun-exposed areas.   Lentigines - Scattered tan macules - Due to sun exposure - Benign-appering, observe - Recommend daily broad spectrum sunscreen SPF 30+ to sun-exposed areas, reapply every 2 hours as needed. - Call for any changes  Seborrheic Keratoses - Stuck-on, waxy, tan-brown papules and/or plaques  - Benign-appearing - Discussed benign etiology and prognosis. - Observe - Call for any changes  Rosacea Nose  Chronic and persistent condition with duration or expected duration over one year. Condition is bothersome/symptomatic for patient. Currently flared.   Rosacea is a chronic progressive skin condition usually affecting the face of adults, causing redness and/or acne bumps. It is treatable but not curable. It sometimes affects the eyes (ocular rosacea) as well. It may respond to topical and/or systemic medication and can flare with stress, sun exposure, alcohol, exercise and some foods.  Daily application of broad spectrum spf 30+ sunscreen to face is recommended to reduce flares.  Restart metronidazole 0.75% cream Apply BID to nose.   AK (actinic keratosis) (27) L nasal tip x 1, L hand x 6, L forearm x 6, L upper arm x 2, R forearm x 7, R upper arm x 2, R hand dorsum x 3  vs ISKs vrs DSAP - arms  Actinic keratoses are precancerous spots that appear secondary to cumulative UV radiation exposure/sun exposure over time. They are chronic with expected duration over 1 year. A portion of actinic keratoses will progress to squamous cell carcinoma  of the skin. It is not possible to reliably predict which spots will progress to skin cancer and so treatment is recommended to prevent development of skin cancer.  Recommend daily broad spectrum sunscreen SPF 30+ to sun-exposed areas, reapply every 2 hours as needed.  Recommend staying in the shade or wearing long sleeves, sun glasses (UVA+UVB  protection) and wide brim hats (4-inch brim around the entire circumference of the hat). Call for new or changing lesions.  Destruction of lesion - L nasal tip x 1, L hand x 6, L forearm x 6, L upper arm x 2, R forearm x 7, R upper arm x 2, R hand dorsum x 3  Destruction method: cryotherapy   Informed consent: discussed and consent obtained   Lesion destroyed using liquid nitrogen: Yes   Region frozen until ice ball extended beyond lesion: Yes   Outcome: patient tolerated procedure well with no complications   Post-procedure details: wound care instructions given   Additional details:  Prior to procedure, discussed risks of blister formation, small wound, skin dyspigmentation, or rare scar following cryotherapy. Recommend Vaseline ointment to treated areas while healing.   Vascular birthmark Right Forehead  Benign, observe.   Inflamed seborrheic keratosis Left Neck  Symptomatic, irritating, patient would like treated.  Destruction of lesion - Left Neck  Destruction method: cryotherapy   Informed consent: discussed and consent obtained   Lesion destroyed using liquid nitrogen: Yes   Region frozen until ice ball extended beyond lesion: Yes   Outcome: patient tolerated procedure well with no complications   Post-procedure details: wound care instructions given   Additional details:  Prior to procedure, discussed risks of blister formation, small wound, skin dyspigmentation, or rare scar following cryotherapy. Recommend Vaseline ointment to treated areas while healing.   Psoriasis arms, legs, back  Chronic and persistent condition with duration or expected duration over one year. Condition is symptomatic / bothersome to patient. Not to goal.  Psoriasis is a chronic non-curable, but treatable genetic/hereditary disease that may have other systemic features affecting other organ systems such as joints (Psoriatic Arthritis). It is associated with an increased risk of inflammatory  bowel disease, heart disease, non-alcoholic fatty liver disease, and depression.    Continue clobetasol cream Apply to AA QD/BID prn flares dsp 60g 2Rf. Avoid face, groin, axilla. Caution skin atrophy with long-term use.    Topical steroids (such as triamcinolone, fluocinolone, fluocinonide, mometasone, clobetasol, halobetasol, betamethasone, hydrocortisone) can cause thinning and lightening of the skin if they are used for too long in the same area. Your physician has selected the right strength medicine for your problem and area affected on the body. Please use your medication only as directed by your physician to prevent side effects.     Return in about 6 months (around 09/26/2022) for AKs, Psoriasis.  IJamesetta Orleans, CMA, am acting as scribe for [name], MD .  Documentation: I have reviewed the above documentation for accuracy and completeness, and I agree with the above.  [name] MD

## 2022-08-13 ENCOUNTER — Telehealth: Payer: Self-pay

## 2022-08-13 MED ORDER — CLOBETASOL PROPIONATE 0.05 % EX CREA: TOPICAL_CREAM | CUTANEOUS | 2 refills | Status: DC

## 2022-08-13 NOTE — Telephone Encounter (Signed)
Patient called for a refill of Clobetasol cream for Psoriasis   Ok clobetasol cream erx;d to [facility]

## 2022-10-08 ENCOUNTER — Encounter: Payer: Self-pay | Admitting: Dermatology

## 2022-10-08 ENCOUNTER — Ambulatory Visit (INDEPENDENT_AMBULATORY_CARE_PROVIDER_SITE_OTHER): Payer: Medicare Other | Admitting: Dermatology

## 2022-10-08 VITALS — BP 128/71 | HR 61

## 2022-10-08 DIAGNOSIS — L409 Psoriasis, unspecified: Secondary | ICD-10-CM

## 2022-10-08 DIAGNOSIS — Z79899 Other long term (current) drug therapy: Secondary | ICD-10-CM

## 2022-10-08 DIAGNOSIS — L821 Other seborrheic keratosis: Secondary | ICD-10-CM | POA: Diagnosis not present

## 2022-10-08 DIAGNOSIS — L820 Inflamed seborrheic keratosis: Secondary | ICD-10-CM | POA: Diagnosis not present

## 2022-10-08 DIAGNOSIS — L578 Other skin changes due to chronic exposure to nonionizing radiation: Secondary | ICD-10-CM

## 2022-10-08 MED ORDER — SOTYKTU 6 MG PO TABS: 1.0000 | ORAL_TABLET | Freq: Every day | ORAL | 5 refills | Status: DC

## 2022-10-08 MED ORDER — CLOBETASOL PROPIONATE 0.05 % EX CREA: TOPICAL_CREAM | CUTANEOUS | 2 refills | Status: DC

## 2022-10-08 NOTE — Patient Instructions (Addendum)
Cryotherapy Aftercare  Wash gently with soap and water everyday.   Apply Vaseline and Band-Aid daily until healed.   Continue clobetasol 0.05% cream 1-2 times daily as needed for flares. Avoid applying to face, groin, and axilla. Use as directed. Long-term use can cause thinning of the skin.  Counseling on psoriasis and coordination of care  psoriasis is a chronic non-curable, but treatable genetic/hereditary disease that may have other systemic features affecting other organ systems such as joints (Psoriatic Arthritis). It is associated with an increased risk of inflammatory bowel disease, heart disease, non-alcoholic fatty liver disease, and depression.  Treatments include light and laser treatments; topical medications; and systemic medications including oral and injectables.  Side effects of Otezla (apremilast) include diarrhea, nausea, headache, upper respiratory infection, depression, and weight decrease (5-10%). It should only be taken by pregnant women after a discussion regarding risks and benefits with their doctor. Goal is control of skin condition, not cure.  The use of [place] Nail requires long term medication management, including periodic office visits.  Reviewed risks of biologics including immunosuppression, infections, injection site reaction, and failure to improve condition. Goal is control of skin condition, not cure.  Some older biologics such as Humira and Enbrel may slightly increase risk of malignancy and may worsen congestive heart failure.  Taltz and Cosentyx may cause inflammatory bowel disease to flare. The use of biologics requires long term medication management, including periodic office visits and monitoring of blood work.  Topical steroids (such as triamcinolone, fluocinolone, fluocinonide, mometasone, clobetasol, halobetasol, betamethasone, hydrocortisone) can cause thinning and lightening of the skin if they are used for too long in the same area. Your physician has  selected the right strength medicine for your problem and area affected on the body. Please use your medication only as directed by your physician to prevent side effects.   Due to recent changes in healthcare laws, you may see results of your pathology and/or laboratory studies on MyChart before the doctors have had a chance to review them. We understand that in some cases there may be results that are confusing or concerning to you. Please understand that not all results are received at the same time and often the doctors may need to interpret multiple results in order to provide you with the best plan of care or course of treatment. Therefore, we ask that you please give [place] 2 business days to thoroughly review all your results before contacting the office for clarification. Should we see a critical lab result, you will be contacted sooner.   If You Need Anything After Your Visit  If you have any questions or concerns for your doctor, please call our main line at [phone] and press option 4 to reach your doctor's medical assistant. If no one answers, please leave a voicemail as directed and we will return your call as soon as possible. Messages left after 4 pm will be answered the following business day.   You may also send [place] a message via [place]. We typically respond to MyChart messages within 1-2 business days.  For prescription refills, please ask your pharmacy to contact our office. Our fax number is [phone].  If you have an urgent issue when the clinic is closed that cannot wait until the next business day, you can page your doctor at the number below.    Please note that while we do our best to be available for urgent issues outside of office hours, we are not available 24/7.   If you  have an urgent issue and are unable to reach [place], you may choose to seek medical care at your doctor's office, retail clinic, urgent care center, or emergency room.  If you have a medical emergency,  please immediately call 911 or go to the emergency department.  Pager Numbers  - Dr. Nehemiah Massed: [phone]  - Dr. Laurence Ferrari: [phone]  - Dr. Nicole Kindred: [phone]  In the event of inclement weather, please call our main line at [phone] for an update on the status of any delays or closures.  Dermatology Medication Tips: Please keep the boxes that topical medications come in in order to help keep track of the instructions about where and how to use these. Pharmacies typically print the medication instructions only on the boxes and not directly on the medication tubes.   If your medication is too expensive, please contact our office at [phone] option 4 or send [place] a message through [place].   We are unable to tell what your co-pay for medications will be in advance as this is different depending on your insurance coverage. However, we may be able to find a substitute medication at lower cost or fill out paperwork to get insurance to cover a needed medication.   If a prior authorization is required to get your medication covered by your insurance company, please allow [place] 1-2 business days to complete this process.  Drug prices often vary depending on where the prescription is filled and some pharmacies may offer cheaper prices.  The website www.goodrx.com contains coupons for medications through different pharmacies. The prices here do not account for what the cost may be with help from insurance (it may be cheaper with your insurance), but the website can give you the price if you did not use any insurance.  - You can print the associated coupon and take it with your prescription to the pharmacy.  - You may also stop by our office during regular business hours and pick up a GoodRx coupon card.  - If you need your prescription sent electronically to a different pharmacy, notify our office through [facility] or by phone at [phone] option 4.     Si Usted Necesita Algo  Despus de Su Visita  Tambin puede enviarnos un mensaje a travs de Pharmacist, community. Por lo general respondemos a los mensajes de MyChart en el transcurso de 1 a 2 das hbiles.  Para renovar recetas, por favor pida a su farmacia que se ponga en contacto con nuestra oficina. Harland Dingwall de fax es [place] [phone].  Si tiene un asunto urgente cuando la clnica est cerrada y que no puede esperar hasta el siguiente da hbil, puede llamar/localizar a su doctor(a) al nmero que aparece a continuacin.   Por favor, tenga en cuenta que aunque hacemos todo lo posible para estar disponibles para asuntos urgentes fuera del horario de [place], no estamos disponibles las 24 horas del da, los 7 das de la [place].   Si tiene un problema urgente y no puede comunicarse con nosotros, puede optar por buscar atencin mdica  en el consultorio de su doctor(a), en una clnica privada, en un centro de atencin urgente o en una sala de emergencias.  Si tiene Engineering geologist, por favor llame inmediatamente al 911 o vaya a la sala de emergencias.  Nmeros de bper  - Dr. Nehemiah Massed: [phone]  - Dra. Moye: [phone]  - Dra. Nicole Kindred: [phone]  En caso de inclemencias del tiempo, por favor llame a nuestra lnea principal al [phone]  para [employer] de cualquier retraso o cierre.  Consejos para la medicacin en dermatologa: Por favor, guarde las cajas en las que vienen los medicamentos de uso tpico para ayudarle a seguir las instrucciones sobre dnde y cmo usarlos. Las farmacias generalmente imprimen las instrucciones del medicamento slo en las cajas y no directamente en los tubos del [place].   Si su medicamento es muy caro, por favor, pngase en contacto con Zigmund Daniel llamando al [phone] y presione la opcin 4 o envenos un mensaje a travs de Pharmacist, community.   No podemos decirle cul ser su copago por los medicamentos por adelantado ya que esto es diferente  dependiendo de la cobertura de su seguro. Sin embargo, es posible que podamos encontrar un medicamento sustituto a Electrical engineer un formulario para que el seguro cubra el medicamento que se considera necesario.   Si se requiere una autorizacin previa para que su compaa de seguros [place] su medicamento, por favor permtanos de 1 a 2 das hbiles para completar este proceso.  Los precios de los medicamentos varan con frecuencia dependiendo del Environmental consultant de dnde se surte la receta y alguna farmacias pueden ofrecer precios ms baratos.  El sitio web www.goodrx.com tiene cupones para medicamentos de Airline pilot. Los precios aqu no tienen en cuenta lo que podra costar con la ayuda del seguro (puede ser ms barato con su seguro), pero el sitio web puede darle el precio si no utiliz Research scientist (physical sciences).  - Puede imprimir el cupn correspondiente y llevarlo con su receta a la farmacia.  - Tambin puede pasar por nuestra oficina durante el horario de atencin regular y Charity fundraiser una tarjeta de cupones de GoodRx.  - Si necesita que su receta se enve electrnicamente a una farmacia diferente, informe a nuestra oficina a travs de MyChart de [place] o por telfono llamando al [phone] y presione la opcin 4.

## 2022-10-08 NOTE — Progress Notes (Signed)
Follow-Up Visit   Subjective  Dawn Dickerson is a 89 y.o. female who presents for the following: Actinic Keratosis (Treated at arms, hands and nose with LN2. ) and Psoriasis (Patient using clobetasol once daily when needed for flares. Some active areas at legs.).  Doesn't ever clear up and is bothersome.  She has a itchy growth on R arm she would like checked.   The following portions of the chart were reviewed this encounter and updated as appropriate:       Review of Systems:  No other skin or systemic complaints except as noted in HPI or Assessment and Plan.  Objective  Well appearing patient in no apparent distress; mood and affect are within normal limits.  A focused examination was performed including arms, legs, back and face. Relevant physical exam findings are noted in the Assessment and Plan.  arms, legs, back Pink macules, patches at hands, arms, some scaly Pink scaly papules at inframammary, back, stomach, legs, mainly at hips and thighs BSA 10%  R Flexor Forearm x 1 Erythematous stuck-on, waxy papule     Assessment & Plan  Psoriasis arms, legs, back  Chronic and persistent condition with duration or expected duration over one year. Condition is symptomatic/ bothersome to patient. Not currently at goal.   Counseling on psoriasis and coordination of care  psoriasis is a chronic non-curable, but treatable genetic/hereditary disease that may have other systemic features affecting other organ systems such as joints (Psoriatic Arthritis). It is associated with an increased risk of inflammatory bowel disease, heart disease, non-alcoholic fatty liver disease, and depression.  Treatments include light and laser treatments; topical medications; and systemic medications including oral and injectables.  Side effects of Otezla (apremilast) include diarrhea, nausea, headache, upper respiratory infection, depression, and weight decrease (5-10%). It should only be taken by pregnant  women after a discussion regarding risks and benefits with their doctor. Goal is control of skin condition, not cure.  The use of [place] requires long term medication management, including periodic office visits.  Reviewed risks of biologics including immunosuppression, infections, injection site reaction, and failure to improve condition. Goal is control of skin condition, not cure.  Some older biologics such as Humira and Enbrel may slightly increase risk of malignancy and may worsen congestive heart failure.  Taltz and Cosentyx may cause inflammatory bowel disease to flare. The use of biologics requires long term medication management, including periodic office visits and monitoring of blood work.  Sample of Sotyktu given to patient. If patient wants to continue, will check TB next visit.  Lot # [id number]  Exp: July 2024 Continue clobetasol 0.05% cream 1-2 times daily as needed for flares. Avoid applying to face, groin, and axilla. Use as directed. Long-term use can cause thinning of the skin.  Topical steroids (such as triamcinolone, fluocinolone, fluocinonide, mometasone, clobetasol, halobetasol, betamethasone, hydrocortisone) can cause thinning and lightening of the skin if they are used for too long in the same area. Your physician has selected the right strength medicine for your problem and area affected on the body. Please use your medication only as directed by your physician to prevent side effects.   Most recent labs from 07/25/2022 reviewed, WNL.   Deucravacitinib (SOTYKTU) 6 MG TABS - arms, legs, back Take 1 tablet by mouth daily.  Related Medications clobetasol cream (TEMOVATE) 0.05 % Spot treat affected areas psoriasis once to twice daily until improved. Avoid face, groin, axilla.  Inflamed seborrheic keratosis R Flexor Forearm x 1  Symptomatic,  irritating, patient would like treated.   Destruction of lesion - R Flexor Forearm x 1  Destruction method: cryotherapy   Informed  consent: discussed and consent obtained   Lesion destroyed using liquid nitrogen: Yes   Region frozen until ice ball extended beyond lesion: Yes   Outcome: patient tolerated procedure well with no complications   Post-procedure details: wound care instructions given   Additional details:  Prior to procedure, discussed risks of blister formation, small wound, skin dyspigmentation, or rare scar following cryotherapy. Recommend Vaseline ointment to treated areas while healing.   Seborrheic Keratoses - Stuck-on, waxy, tan-brown papules and/or plaques  - Benign-appearing - Discussed benign etiology and prognosis. - Observe - Call for any changes  Actinic Damage - chronic, secondary to cumulative UV radiation exposure/sun exposure over time - diffuse scaly erythematous macules with underlying dyspigmentation - Recommend daily broad spectrum sunscreen SPF 30+ to sun-exposed areas, reapply every 2 hours as needed.  - Recommend staying in the shade or wearing long sleeves, sun glasses (UVA+UVB protection) and wide brim hats (4-inch brim around the entire circumference of the hat). - Call for new or changing lesions.   Return in about 5 weeks (around 11/12/2022) for Psoriasis.  Graciella Belton, RMA, am acting as scribe for [name], MD .  Documentation: I have reviewed the above documentation for accuracy and completeness, and I agree with the above.  [name] MD

## 2022-10-31 ENCOUNTER — Telehealth: Payer: Self-pay

## 2022-10-31 NOTE — Telephone Encounter (Signed)
Patient called about her Sotyktu medication. She has read the listed side effects and too scared to take medication. She wanted to make you aware. Patient states she will continue her Clobetasol at this time. aw

## 2022-11-18 IMAGING — MG MM DIGITAL SCREENING BILAT W/ TOMO AND CAD
8 series · 8 of 24 positions shown · non-contrast
Comparison: Previous exam(s).

CLINICAL DATA: Screening.

EXAM:
DIGITAL SCREENING BILATERAL MAMMOGRAM WITH TOMOSYNTHESIS AND CAD
TECHNIQUE: Bilateral screening digital craniocaudal and mediolateral oblique
mammograms were obtained. Bilateral screening digital breast
tomosynthesis was performed. The images were evaluated with
computer-aided detection.

[L CC synth-2D]
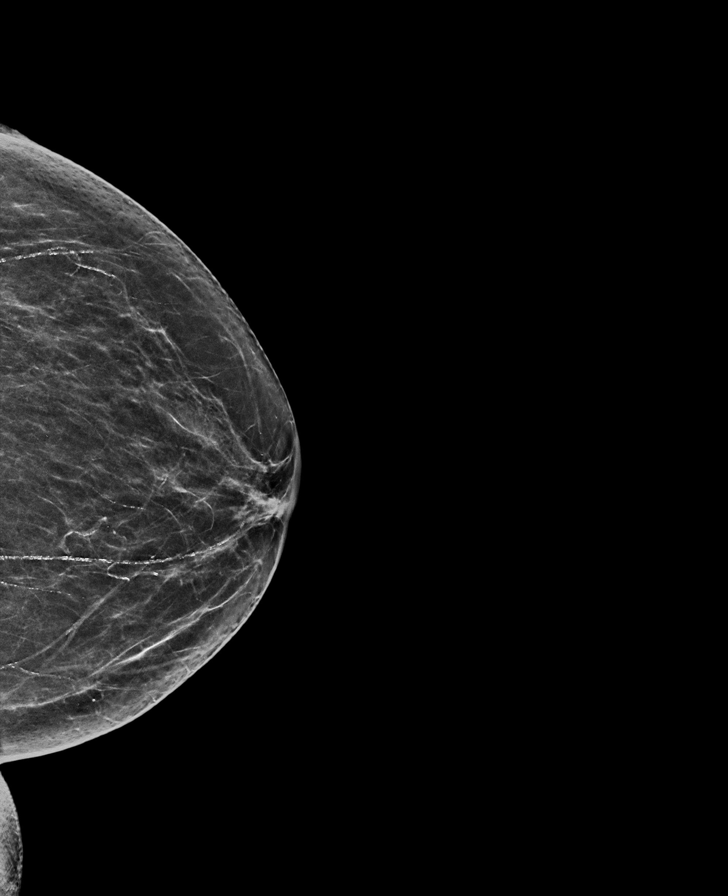

[R MLO synth-2D]
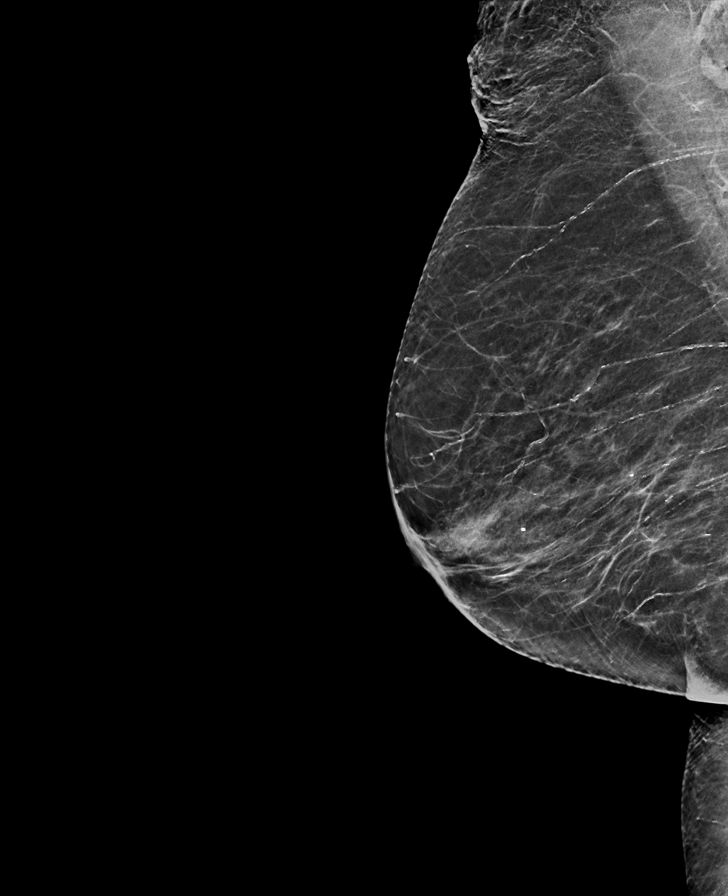

[R CC synth-2D]
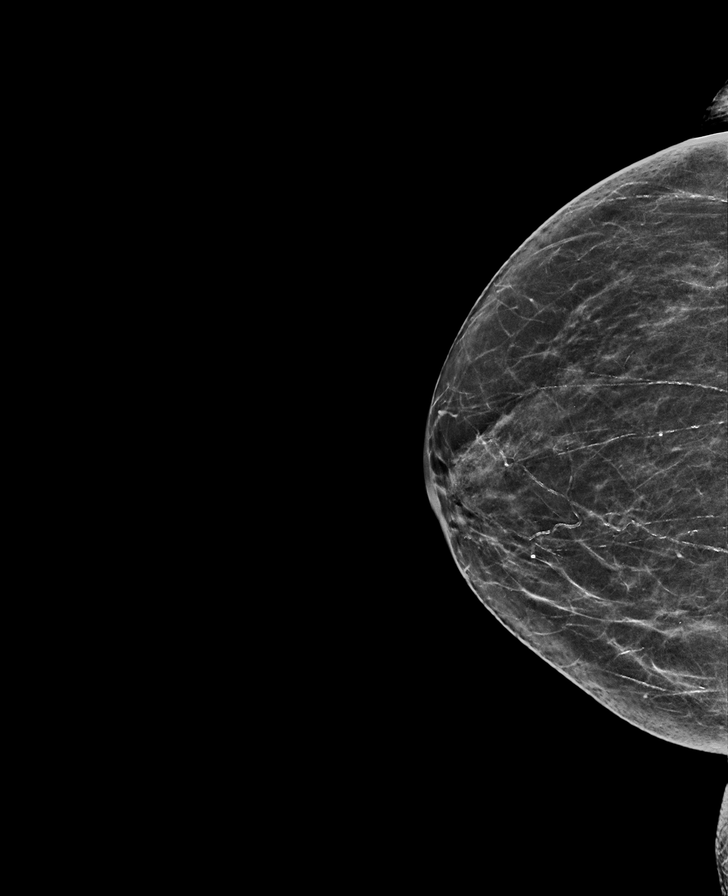

[L MLO synth-2D]
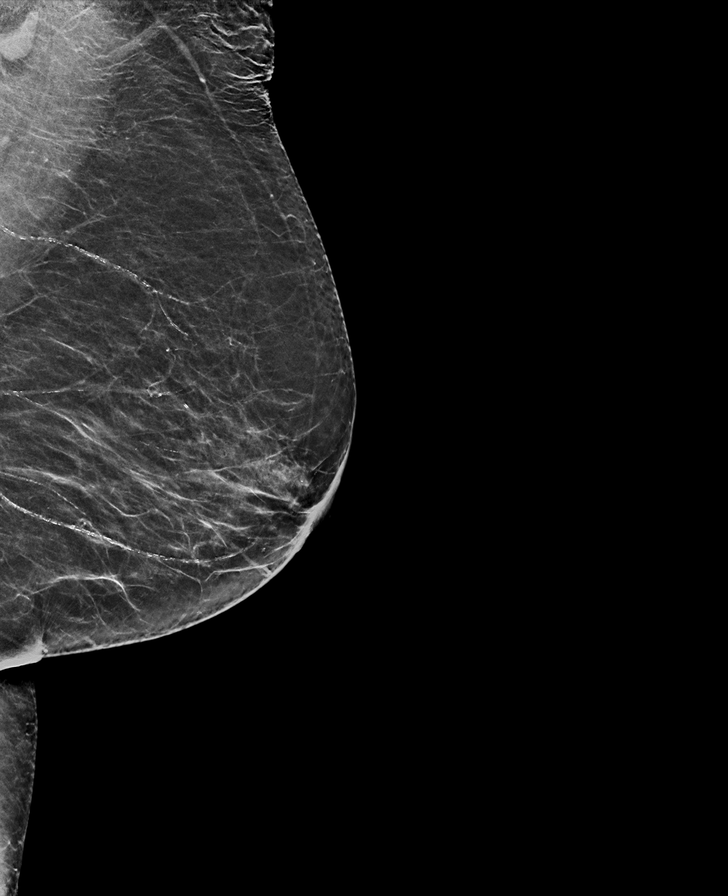

[L MLO tomo · tomo slice 31/61.0]
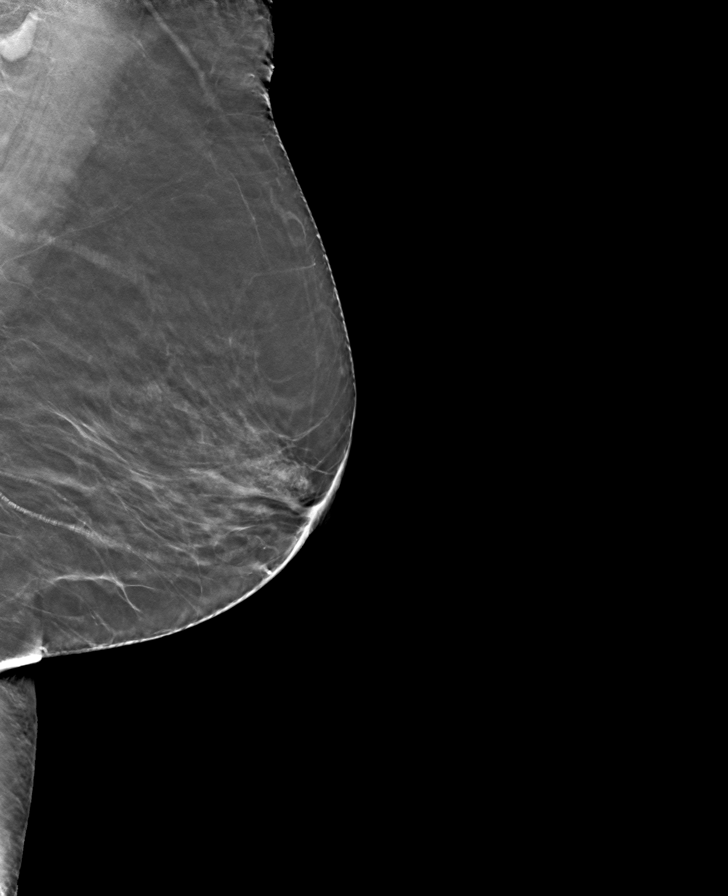

[L CC tomo · tomo slice 31/60.0]
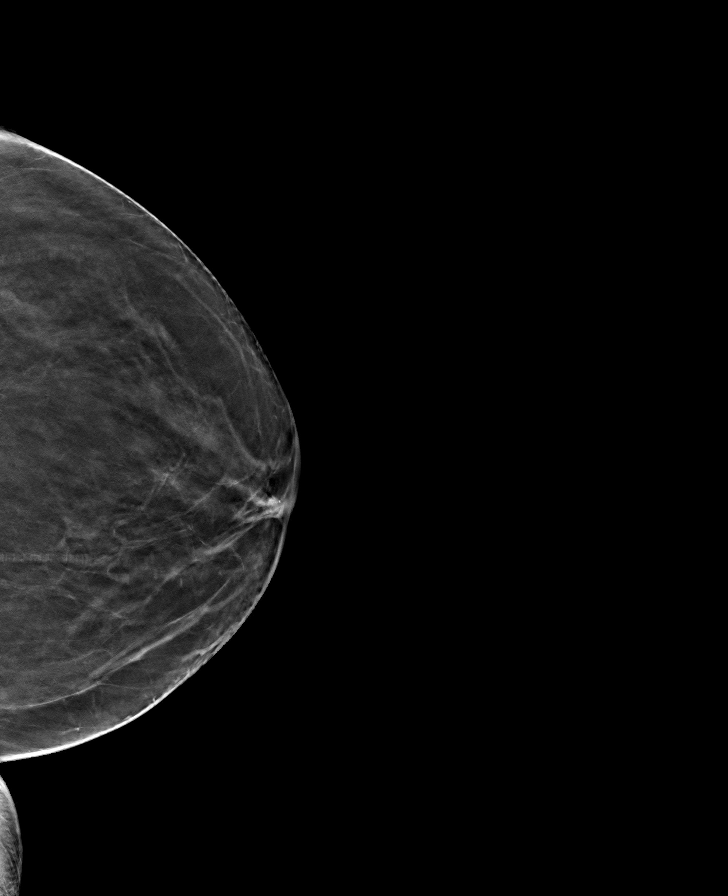

[R MLO tomo · tomo slice 30/59.0]
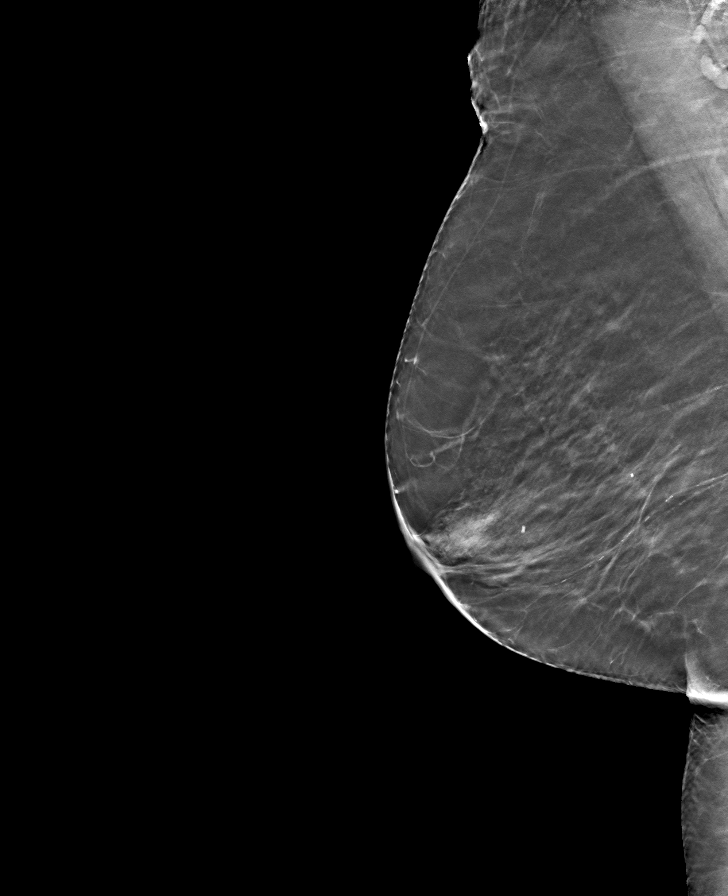

[R CC tomo · tomo slice 29/58.0]
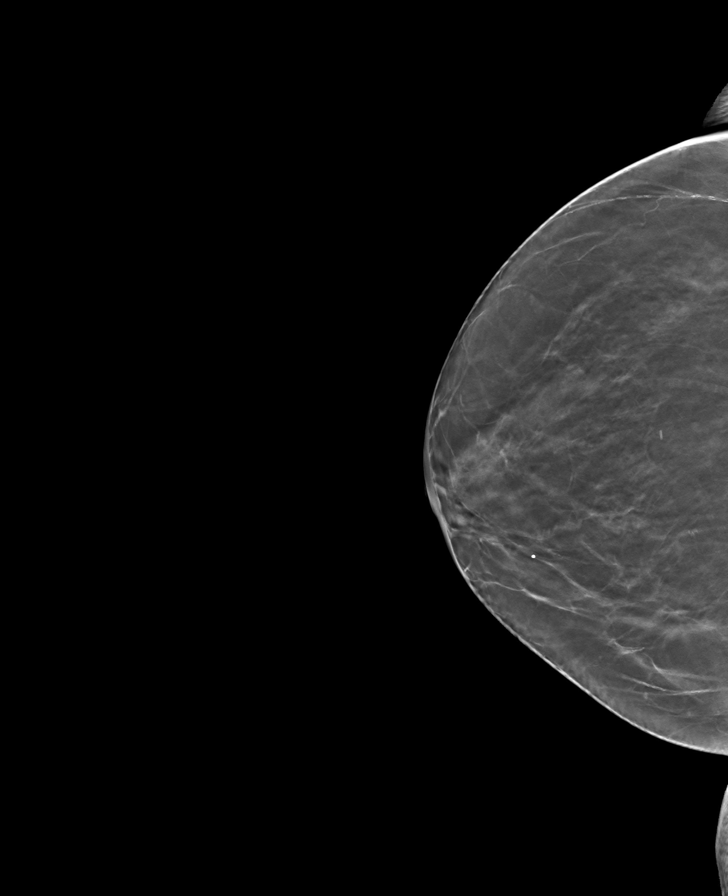

[8 of 24 positions shown; findings below may reference images not displayed]

ACR Breast Density Category b: There are scattered areas of
fibroglandular density.
FINDINGS: There are no findings suspicious for malignancy.
IMPRESSION: No mammographic evidence of malignancy. A result letter of this
screening mammogram will be mailed directly to the patient.

RECOMMENDATION:
Screening mammogram in one year. (Code:51-O-LD2)

BI-RADS CATEGORY  1: Negative.

## 2022-11-25 ENCOUNTER — Encounter: Payer: Self-pay | Admitting: Dermatology

## 2022-11-25 ENCOUNTER — Ambulatory Visit (INDEPENDENT_AMBULATORY_CARE_PROVIDER_SITE_OTHER): Payer: Medicare Other | Admitting: Dermatology

## 2022-11-25 DIAGNOSIS — L409 Psoriasis, unspecified: Secondary | ICD-10-CM

## 2022-11-25 DIAGNOSIS — Z7189 Other specified counseling: Secondary | ICD-10-CM

## 2022-11-25 MED ORDER — CALCIPOTRIENE 0.005 % EX CREA: TOPICAL_CREAM | CUTANEOUS | 2 refills | Status: DC

## 2022-11-25 NOTE — Progress Notes (Signed)
   Follow-Up Visit   Subjective  Dawn Dickerson is a 89 y.o. female who presents for the following: Psoriasis  Patient was given sample of Sotyktu but decided she did not want to take due to side effects. She is using clobetasol 0.05 % cream once daily and has been sitting in the sun which patient feels has helped a lot.   The following portions of the chart were reviewed this encounter and updated as appropriate: medications, allergies, medical history  Review of Systems:  No other skin or systemic complaints except as noted in HPI or Assessment and Plan.  Objective  Well appearing patient in no apparent distress; mood and affect are within normal limits.  Areas Examined: legs  Relevant exam findings are noted in the Assessment and Plan.      Assessment & Plan     PSORIASIS Scattered small pink scaly patches at legs>arms  Chronic and persistent condition with duration or expected duration over one year. Condition is symptomatic/ bothersome to patient. Not currently at goal.   Counseling on psoriasis and coordination of care  psoriasis is a chronic non-curable, but treatable genetic/hereditary disease that may have other systemic features affecting other organ systems such as joints (Psoriatic Arthritis). It is associated with an increased risk of inflammatory bowel disease, heart disease, non-alcoholic fatty liver disease, and depression.  Treatments include light and laser treatments; topical medications; and systemic medications including oral and injectables.  Patient denies joint pain  Treatment Plan: Continue clobetasol 0.05% cream once daily as needed to spot treat for more severe areas. Avoid applying to face, groin, and axilla. Use as directed. Long-term use can cause thinning of the skin. Start calcipotriene cream one to two times daily as needed to aas body.  Continue natural sun exposure 15-20 min 3x/wk, avoid burn Consider Otezla and/or NVUVB light during winter  months.  Topical steroids (such as triamcinolone, fluocinolone, fluocinonide, mometasone, clobetasol, halobetasol, betamethasone, hydrocortisone) can cause thinning and lightening of the skin if they are used for too long in the same area. Your physician has selected the right strength medicine for your problem and area affected on the body. Please use your medication only as directed by your physician to prevent side effects.   Side effects of Otezla (apremilast) include diarrhea, nausea, headache, upper respiratory infection, depression, and weight decrease (5-10%). It should only be taken by pregnant women after a discussion regarding risks and benefits with their doctor. Goal is control of skin condition, not cure.  The use of [place] requires long term medication management, including periodic office visits.   Return in about 6 months (around 05/27/2023) for Psoriasis.  Anise Salvo, RMA, am acting as scribe for Willeen Niece, MD .   Documentation: I have reviewed the above documentation for accuracy and completeness, and I agree with the above.  Willeen Niece, MD

## 2022-11-25 NOTE — Patient Instructions (Addendum)
Continue clobetasol 0.05% cream once daily as needed to spot treat for more severe areas. Avoid applying to face, groin, and axilla. Use as directed. Long-term use can cause thinning of the skin. Start calcipotriene one to two times daily as needed.   Topical steroids (such as triamcinolone, fluocinolone, fluocinonide, mometasone, clobetasol, halobetasol, betamethasone, hydrocortisone) can cause thinning and lightening of the skin if they are used for too long in the same area. Your physician has selected the right strength medicine for your problem and area affected on the body. Please use your medication only as directed by your physician to prevent side effects.   Side effects of Otezla (apremilast) include diarrhea, nausea, headache, upper respiratory infection, depression, and weight decrease (5-10%). It should only be taken by pregnant women after a discussion regarding risks and benefits with their doctor. Goal is control of skin condition, not cure.  The use of [place] requires long term medication management, including periodic office visits.  Due to recent changes in healthcare laws, you may see results of your pathology and/or laboratory studies on MyChart before the doctors have had a chance to review them. We understand that in some cases there may be results that are confusing or concerning to you. Please understand that not all results are received at the same time and often the doctors may need to interpret multiple results in order to provide you with the best plan of care or course of treatment. Therefore, we ask that you please give [place] 2 business days to thoroughly review all your results before contacting the office for clarification. Should we see a critical lab result, you will be contacted sooner.   If You Need Anything After Your Visit  If you have any questions or concerns for your doctor, please call our main line at [phone] and press option 4 to reach your doctor's medical  assistant. If no one answers, please leave a voicemail as directed and we will return your call as soon as possible. Messages left after 4 pm will be answered the following business day.   You may also send [place] a message via MyChart. We typically respond to MyChart messages within 1-2 business days.  For prescription refills, please ask your pharmacy to contact our office. Our fax number is [phone].  If you have an urgent issue when the clinic is closed that cannot wait until the next business day, you can page your doctor at the number below.    Please note that while we do our best to be available for urgent issues outside of office hours, we are not available 24/7.   If you have an urgent issue and are unable to reach [place], you may choose to seek medical care at your doctor's office, retail clinic, urgent care center, or emergency room.  If you have a medical emergency, please immediately call 911 or go to the emergency department.  Pager Numbers  - Dr. Gwen Pounds: [phone]  - Dr. Neale Burly: [phone]  - Dr. Roseanne Reno: [phone]  In the event of inclement weather, please call our main line at [phone] for an update on the status of any delays or closures.  Dermatology Medication Tips: Please keep the boxes that topical medications come in in order to help keep track of the instructions about where and how to use these. Pharmacies typically print the medication instructions only on the boxes and not directly on the medication tubes.   If your medication is too expensive, please contact our office at [phone]  option 4 or send [place] a message through MyChart.   We are unable to tell what your co-pay for medications will be in advance as this is different depending on your insurance coverage. However, we may be able to find a substitute medication at lower cost or fill out paperwork to get insurance to cover a needed medication.   If a prior authorization is required to get your  medication covered by your insurance company, please allow [place] 1-2 business days to complete this process.  Drug prices often vary depending on where the prescription is filled and some pharmacies may offer cheaper prices.  The website www.goodrx.com contains coupons for medications through different pharmacies. The prices here do not account for what the cost may be with help from insurance (it may be cheaper with your insurance), but the website can give you the price if you did not use any insurance.  - You can print the associated coupon and take it with your prescription to the pharmacy.  - You may also stop by our office during regular business hours and pick up a GoodRx coupon card.  - If you need your prescription sent electronically to a different pharmacy, notify our office through [facility] or by phone at [phone] option 4.

## 2023-03-25 ENCOUNTER — Ambulatory Visit (INDEPENDENT_AMBULATORY_CARE_PROVIDER_SITE_OTHER): Payer: Medicare Other | Admitting: Dermatology

## 2023-03-25 ENCOUNTER — Encounter: Payer: Self-pay | Admitting: Dermatology

## 2023-03-25 DIAGNOSIS — D485 Neoplasm of uncertain behavior of skin: Secondary | ICD-10-CM

## 2023-03-25 DIAGNOSIS — C4492 Squamous cell carcinoma of skin, unspecified: Secondary | ICD-10-CM

## 2023-03-25 DIAGNOSIS — C44722 Squamous cell carcinoma of skin of right lower limb, including hip: Secondary | ICD-10-CM

## 2023-03-25 HISTORY — DX: Squamous cell carcinoma of skin, unspecified: C44.92

## 2023-03-25 NOTE — Progress Notes (Signed)
   Follow-Up Visit   Subjective  Dawn Dickerson is a 89 y.o. female who presents for the following: spot at left thigh, present for at least one week. Patient reports it is sore and has gotten bigger. She has been putting neosporin and cortisone on it with no change.   Patient accompanied by son Gala Romney.   The patient has spots, moles and lesions to be evaluated, some may be new or changing and the patient may have concern these could be cancer.   The following portions of the chart were reviewed this encounter and updated as appropriate: medications, allergies, medical history  Review of Systems:  No other skin or systemic complaints except as noted in HPI or Assessment and Plan.  Objective  Well appearing patient in no apparent distress; mood and affect are within normal limits.   A focused examination was performed of the following areas:   Relevant exam findings are noted in the Assessment and Plan.  right anterior thigh Pink verrucous plaque 13 mm ISK vs SCC vs Psoriasis          Assessment & Plan     Neoplasm of uncertain behavior of skin right anterior thigh  Skin / nail biopsy Type of biopsy: tangential   Informed consent: discussed and consent obtained   Timeout: patient name, date of birth, surgical site, and procedure verified   Procedure prep:  Patient was prepped and draped in usual sterile fashion Prep type:  Isopropyl alcohol Anesthesia: the lesion was anesthetized in a standard fashion   Anesthetic:  1% lidocaine w/ epinephrine 1-100,000 buffered w/ 8.4% NaHCO3 Instrument used: DermaBlade   Hemostasis achieved with: pressure and aluminum chloride   Outcome: patient tolerated procedure well   Post-procedure details: sterile dressing applied and wound care instructions given   Dressing type: bandage and petrolatum    Specimen 1 - Surgical pathology Differential Diagnosis: ISK vs SCC vs Psoriasis  Check Margins: No Pink verrucous plaque 13  mm     Return for Psoriasis, as scheduled.  Anise Salvo, RMA, am acting as scribe for Elie Goody, MD .   Documentation: I have reviewed the above documentation for accuracy and completeness, and I agree with the above.  Elie Goody, MD

## 2023-03-25 NOTE — Patient Instructions (Addendum)

## 2023-03-27 ENCOUNTER — Telehealth: Payer: Self-pay

## 2023-03-27 NOTE — Telephone Encounter (Signed)
Patient advised pathology showed SCC, scheduled for excision 04/09/23 at 10:30 am. Butch Penny., RMA

## 2023-03-27 NOTE — Telephone Encounter (Signed)
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 Please message me with patient's choice and schedule ED&C or excision  Explanation: This is a squamous cell skin cancer that has grown beyond the surface of the skin and is invading the second layer of the skin. It has the potential to spread beyond the skin and threaten your health, so I recommend treating it.  Treatment option 1: you return for a 15 minute appointment where we perform electrodesiccation and curettage [facility]). This involves three rounds of scraping and burning to destroy the skin cancer. It has about a 75% cure rate and leaves a round wound slightly larger than the skin cancer and leaves a round white scar. No additional pathology is done. If the skin cancer comes back, we would need to do a surgery to remove it.   Treatment option 2: you return for an hour long appointment where we perform a skin surgery. We numb the site of the skin cancer and a safety margin of normal skin around it. We remove the full thickness of skin and close the wound with two layers of stitches. The sample is sent to the lab to check that the skin cancer was fully removed. Return one week later to have wound checked and surface stitches removed. Surgical wound leaves a line scar. Approximately 95% cure rate. Risk of recurrence, bleeding, infection, pain, injury to nearby structures, hypertrophic scar.

## 2023-04-09 ENCOUNTER — Ambulatory Visit (INDEPENDENT_AMBULATORY_CARE_PROVIDER_SITE_OTHER): Payer: Medicare Other | Admitting: Dermatology

## 2023-04-09 ENCOUNTER — Encounter: Payer: Self-pay | Admitting: Dermatology

## 2023-04-09 DIAGNOSIS — D485 Neoplasm of uncertain behavior of skin: Secondary | ICD-10-CM

## 2023-04-09 DIAGNOSIS — C44722 Squamous cell carcinoma of skin of right lower limb, including hip: Secondary | ICD-10-CM | POA: Diagnosis not present

## 2023-04-09 MED ORDER — MUPIROCIN 2 % EX OINT: TOPICAL_OINTMENT | CUTANEOUS | 0 refills | Status: DC

## 2023-04-09 NOTE — Progress Notes (Signed)
   Follow-Up Visit   Subjective  Dawn Dickerson is a 89 y.o. female who presents for the following: Excision of bx proven SCC at right anterior thigh.  The following portions of the chart were reviewed this encounter and updated as appropriate: medications, allergies, medical history  Review of Systems:  No other skin or systemic complaints except as noted in HPI or Assessment and Plan.  Objective  Well appearing patient in no apparent distress; mood and affect are within normal limits.  A focused examination was performed of the following areas: Right leg  Relevant physical exam findings are noted in the Assessment and Plan.   right anterior thigh Pink biopsy site    Assessment & Plan   Squamous cell carcinoma of skin of right lower limb, including hip right anterior thigh  Skin excision  Total excision diameter (cm):  1.5 Informed consent: discussed and consent obtained   Timeout: patient name, date of birth, surgical site, and procedure verified   Procedure prep:  Patient was prepped and draped in usual sterile fashion Prep type:  Isopropyl alcohol and povidone-iodine Anesthesia: the lesion was anesthetized in a standard fashion   Anesthetic:  1% lidocaine w/ epinephrine 1-100,000 buffered w/ 8.4% NaHCO3 (6 cc lido w/epi, 6 cc bupivicaine) Instrument used: #15 blade   Hemostasis achieved with: pressure and electrodesiccation   Outcome: patient tolerated procedure well with no complications   Post-procedure details: sterile dressing applied and wound care instructions given   Dressing type: bandage and pressure dressing (mupirocin)    Skin repair Complexity:  Intermediate Final length (cm):  5.5 Informed consent: discussed and consent obtained   Timeout: patient name, date of birth, surgical site, and procedure verified   Procedure prep:  Patient was prepped and draped in usual sterile fashion Prep type:  Povidone-iodine Anesthesia: the lesion was anesthetized in a  standard fashion   Anesthetic:  1% lidocaine w/ epinephrine 1-100,000 local infiltration Reason for type of repair: reduce tension to allow closure, reduce the risk of dehiscence, infection, and necrosis and enhance both functionality and cosmetic results   Undermining: edges undermined   Subcutaneous layers (deep stitches):  Suture size:  3-0 Suture type: PDS (polydioxanone)   Stitches:  Buried vertical mattress Fine/surface layer approximation (top stitches):  Suture size:  4-0 Suture type: Prolene (polypropylene)   Suture removal (days):  7 Hemostasis achieved with: suture, pressure and electrodesiccation Outcome: patient tolerated procedure well with no complications   Post-procedure details: wound care instructions given   Additional details:  Mupirocin and a pressure dressing applied  Specimen 1 - Surgical pathology Differential Diagnosis: BX proven SCC  Check Margins: yes Pink biopsy site [phone]  Tag at superior edge  Start Mupirocin ointment once daily with bandage change.      Return in about 1 week (around 04/16/2023) for Suture Removal.  Anise Salvo, RMA, am acting as scribe for Elie Goody, MD .   Documentation: I have reviewed the above documentation for accuracy and completeness, and I agree with the above.  Elie Goody, MD

## 2023-04-09 NOTE — Patient Instructions (Addendum)
Wound Care Instructions for After Surgery  On the day following your surgery, you should begin doing daily dressing changes until your sutures are removed: Remove the bandage. Cleanse the wound gently with soap and water.  Make sure you then dry the skin surrounding the wound completely or the tape will not stick to the skin. Do not use cotton balls on the wound. After the wound is clean and dry, apply the Mupirocin ointment (either prescription antibiotic prescribed by your doctor or plain Vaseline if nothing was prescribed) gently with a Q-tip. If you are using a bandaid to cover: Apply a bandaid large enough to cover the entire wound. If you do not have a bandaid large enough to cover the wound OR if you are sensitive to bandaid adhesive: Cut a non-stick pad (such as Telfa) to fit the size of the wound.  Cover the wound with the non-stick pad. If the wound is draining, you may want to add a small amount of gauze on top of the non-stick pad for a little added compression to the area. Use tape to seal the area completely.  For the next 1-2 weeks: Be sure to keep the wound moist with ointment 24/7 to ensure best healing. If you are unable to cover the wound with a bandage to hold the ointment in place, you may need to reapply the ointment several times a day. Do not bend over or lift heavy items to reduce the chance of elevated blood pressure to the wound. Do not participate in particularly strenuous activities.  Below is a list of dressing supplies you might need.  Cotton-tipped applicators - Q-tips Gauze pads (2x2 and/or 4x4) - All-Purpose Sponges New and clean tube of petroleum jelly (Vaseline) OR prescription antibiotic ointment if prescribed Either a bandaid large enough to cover the entire wound OR non-stick dressing material (Telfa) and Tape (Paper or Hypafix)  FOR ADULT SURGERY PATIENTS: If you need something for pain relief, you may take 1 extra strength Tylenol (acetaminophen) and  2 ibuprofen (200 mg) together every 4 hours as needed. (Do not take these medications if you are allergic to them or if you know you cannot take them for any other reason). Typically you may only need pain medication for 1-3 days.   Comments on the Post-Operative Period Slight swelling and redness often appear around the wound. This is normal and will disappear within several days following the surgery. The healing wound will drain a brownish-red-yellow discharge during healing. This is a normal phase of wound healing. As the wound begins to heal, the drainage may increase in amount. Again, this drainage is normal. Notify us if the drainage becomes persistently bloody, excessively swollen, or intensely painful or develops a foul odor or red streaks.  The healing wound will also typically be itchy. This is normal. If you have severe or persistent pain, Notify us if the discomfort is severe or persistent. Avoid alcoholic beverages when taking pain medicine.  In Case of Wound Hemorrhage A wound hemorrhage is when the bandage suddenly becomes soaked with bright red blood and flows profusely. If this happens, sit down or lie down with your head elevated. If the wound has a dressing on it, do not remove the dressing. Apply pressure to the existing gauze. If the wound is not covered, use a gauze pad to apply pressure and continue applying the pressure for 20 minutes without peeking. DO NOT COVER THE WOUND WITH A LARGE TOWEL OR WASH CLOTH. Release your hand from  the wound site but do not remove the dressing. If the bleeding has stopped, gently clean around the wound. Leave the dressing in place for 24 hours if possible. This wait time allows the blood vessels to close off so that you do not spark a new round of bleeding by disrupting the newly clotted blood vessels with an immediate dressing change. If the bleeding does not subside, continue to hold pressure for 40 minutes. If bleeding continues, page your  physician, contact an After Hours clinic or go to the Emergency Room.  Due to recent changes in healthcare laws, you may see results of your pathology and/or laboratory studies on MyChart before the doctors have had a chance to review them. We understand that in some cases there may be results that are confusing or concerning to you. Please understand that not all results are received at the same time and often the doctors may need to interpret multiple results in order to provide you with the best plan of care or course of treatment. Therefore, we ask that you please give [place] 2 business days to thoroughly review all your results before contacting the office for clarification. Should we see a critical lab result, you will be contacted sooner.   If You Need Anything After Your Visit  If you have any questions or concerns for your doctor, please call our main line at [phone] and press option 4 to reach your doctor's medical assistant. If no one answers, please leave a voicemail as directed and we will return your call as soon as possible. Messages left after 4 pm will be answered the following business day.   You may also send [place] a message via MyChart. We typically respond to MyChart messages within 1-2 business days.  For prescription refills, please ask your pharmacy to contact our office. Our fax number is [phone].  If you have an urgent issue when the clinic is closed that cannot wait until the next business day, you can page your doctor at the number below.    Please note that while we do our best to be available for urgent issues outside of office hours, we are not available 24/7.   If you have an urgent issue and are unable to reach [place], you may choose to seek medical care at your doctor's office, retail clinic, urgent care center, or emergency room.  If you have a medical emergency, please immediately call 911 or go to the emergency department.  Pager Numbers  - Dr. Gwen Pounds:  [phone]  - Dr. Roseanne Reno: [phone]  - Dr. Katrinka Blazing: [phone]   In the event of inclement weather, please call our main line at [phone] for an update on the status of any delays or closures.  Dermatology Medication Tips: Please keep the boxes that topical medications come in in order to help keep track of the instructions about where and how to use these. Pharmacies typically print the medication instructions only on the boxes and not directly on the medication tubes.   If your medication is too expensive, please contact our office at [phone] option 4 or send [place] a message through MyChart.   We are unable to tell what your co-pay for medications will be in advance as this is different depending on your insurance coverage. However, we may be able to find a substitute medication at lower cost or fill out paperwork to get insurance to cover a needed medication.   If a prior authorization is required to get your medication  covered by your insurance company, please allow [place] 1-2 business days to complete this process.  Drug prices often vary depending on where the prescription is filled and some pharmacies may offer cheaper prices.  The website www.goodrx.com contains coupons for medications through different pharmacies. The prices here do not account for what the cost may be with help from insurance (it may be cheaper with your insurance), but the website can give you the price if you did not use any insurance.  - You can print the associated coupon and take it with your prescription to the pharmacy.  - You may also stop by our office during regular business hours and pick up a GoodRx coupon card.  - If you need your prescription sent electronically to a different pharmacy, notify our office through [facility] or by phone at [phone] option 4.     Si Usted Necesita Algo Despus de Su Visita  Tambin puede enviarnos un mensaje a travs de Clinical cytogeneticist. Por lo general  respondemos a los mensajes de MyChart en el transcurso de 1 a 2 das hbiles.  Para renovar recetas, por favor pida a su farmacia que se ponga en contacto con nuestra oficina. Annie Sable de fax es [place] [phone].  Si tiene un asunto urgente cuando la clnica est cerrada y que no puede esperar hasta el siguiente da hbil, puede llamar/localizar a su doctor(a) al nmero que aparece a continuacin.   Por favor, tenga en cuenta que aunque hacemos todo lo posible para estar disponibles para asuntos urgentes fuera del horario de [place], no estamos disponibles las 24 horas del da, los 7 [address]  [address] de la [place].   Si tiene un problema urgente y no puede comunicarse con nosotros, puede optar por buscar atencin mdica  en el consultorio de su doctor(a), en una clnica privada, en un centro de atencin urgente o en una sala de emergencias.  Si tiene Engineer, drilling, por favor llame inmediatamente al 911 o vaya a la sala de emergencias.  Nmeros de bper  - Dr. Gwen Pounds: [phone]  - Dra. Roseanne Reno: [phone]  - Dr. Katrinka Blazing: [phone]   En caso de inclemencias del tiempo, por favor llame a Lacy Duverney principal al [phone] para una actualizacin sobre el [place] de cualquier retraso o cierre.  Consejos para la medicacin en dermatologa: Por favor, guarde las cajas en las que vienen los medicamentos de uso tpico para ayudarle a seguir las instrucciones sobre dnde y cmo usarlos. Las farmacias generalmente imprimen las instrucciones del medicamento slo en las cajas y no directamente en los tubos del [place].   Si su medicamento es muy caro, por favor, pngase en contacto con Rolm Gala llamando al [phone] y presione la opcin 4 o envenos un mensaje a travs de Clinical cytogeneticist.   No podemos decirle cul ser su copago por los medicamentos por adelantado ya que esto es diferente dependiendo de la cobertura de su seguro. Sin embargo, es posible que podamos encontrar un  medicamento sustituto a Audiological scientist un formulario para que el seguro cubra el medicamento que se considera necesario.   Si se requiere una autorizacin previa para que su compaa de seguros [place] su medicamento, por favor permtanos de 1 a 2 das hbiles para completar [address]  Los precios de los medicamentos varan con frecuencia dependiendo del Environmental consultant de dnde se surte la receta y alguna farmacias pueden ofrecer precios ms baratos.  El sitio web www.goodrx.com tiene cupones para medicamentos de Health and safety inspector. Los precios  aqu no tienen en cuenta lo que podra costar con la ayuda del seguro (puede ser ms barato con su seguro), pero el sitio web puede darle el precio si no Visual merchandiser.  - Puede imprimir el cupn correspondiente y llevarlo con su receta a la farmacia.  - Tambin puede pasar por nuestra oficina durante el horario de atencin regular y Education officer, museum una tarjeta de cupones de GoodRx.  - Si necesita que su receta se enve electrnicamente a una farmacia diferente, informe a nuestra oficina a travs de MyChart de [place] o por telfono llamando al [phone] y presione la opcin 4.

## 2023-04-10 ENCOUNTER — Telehealth: Payer: Self-pay

## 2023-04-10 ENCOUNTER — Encounter: Payer: Self-pay | Admitting: Dermatology

## 2023-04-10 NOTE — Telephone Encounter (Signed)
Spoke with patient today and she is doing fine following yesterday's surgery. Butch Penny., RMA

## 2023-04-14 DIAGNOSIS — J40 Bronchitis, not specified as acute or chronic: Secondary | ICD-10-CM | POA: Diagnosis present

## 2023-04-14 DIAGNOSIS — I2489 Other forms of acute ischemic heart disease: Secondary | ICD-10-CM | POA: Diagnosis present

## 2023-04-14 DIAGNOSIS — A4159 Other Gram-negative sepsis: Secondary | ICD-10-CM | POA: Diagnosis not present

## 2023-04-14 DIAGNOSIS — L409 Psoriasis, unspecified: Secondary | ICD-10-CM | POA: Diagnosis present

## 2023-04-14 DIAGNOSIS — D649 Anemia, unspecified: Secondary | ICD-10-CM | POA: Diagnosis present

## 2023-04-14 DIAGNOSIS — Z888 Allergy status to other drugs, medicaments and biological substances status: Secondary | ICD-10-CM

## 2023-04-14 DIAGNOSIS — J90 Pleural effusion, not elsewhere classified: Secondary | ICD-10-CM | POA: Diagnosis present

## 2023-04-14 DIAGNOSIS — I471 Supraventricular tachycardia, unspecified: Secondary | ICD-10-CM | POA: Diagnosis not present

## 2023-04-14 DIAGNOSIS — Z602 Problems related to living alone: Secondary | ICD-10-CM | POA: Diagnosis present

## 2023-04-14 DIAGNOSIS — Z83438 Family history of other disorder of lipoprotein metabolism and other lipidemia: Secondary | ICD-10-CM

## 2023-04-14 DIAGNOSIS — F32A Depression, unspecified: Secondary | ICD-10-CM | POA: Diagnosis present

## 2023-04-14 DIAGNOSIS — M810 Age-related osteoporosis without current pathological fracture: Secondary | ICD-10-CM | POA: Diagnosis present

## 2023-04-14 DIAGNOSIS — J9811 Atelectasis: Secondary | ICD-10-CM | POA: Diagnosis present

## 2023-04-14 DIAGNOSIS — R296 Repeated falls: Secondary | ICD-10-CM | POA: Diagnosis present

## 2023-04-14 DIAGNOSIS — N390 Urinary tract infection, site not specified: Secondary | ICD-10-CM | POA: Diagnosis present

## 2023-04-14 DIAGNOSIS — H409 Unspecified glaucoma: Secondary | ICD-10-CM | POA: Diagnosis present

## 2023-04-14 DIAGNOSIS — N179 Acute kidney failure, unspecified: Secondary | ICD-10-CM | POA: Diagnosis present

## 2023-04-14 DIAGNOSIS — Z85828 Personal history of other malignant neoplasm of skin: Secondary | ICD-10-CM

## 2023-04-14 DIAGNOSIS — Z8249 Family history of ischemic heart disease and other diseases of the circulatory system: Secondary | ICD-10-CM

## 2023-04-14 DIAGNOSIS — B961 Klebsiella pneumoniae [K. pneumoniae] as the cause of diseases classified elsewhere: Secondary | ICD-10-CM | POA: Diagnosis present

## 2023-04-14 DIAGNOSIS — I129 Hypertensive chronic kidney disease with stage 1 through stage 4 chronic kidney disease, or unspecified chronic kidney disease: Secondary | ICD-10-CM | POA: Diagnosis present

## 2023-04-14 DIAGNOSIS — Z9089 Acquired absence of other organs: Secondary | ICD-10-CM

## 2023-04-14 DIAGNOSIS — R7989 Other specified abnormal findings of blood chemistry: Secondary | ICD-10-CM | POA: Diagnosis not present

## 2023-04-14 DIAGNOSIS — I272 Pulmonary hypertension, unspecified: Secondary | ICD-10-CM | POA: Diagnosis present

## 2023-04-14 DIAGNOSIS — Z79890 Hormone replacement therapy (postmenopausal): Secondary | ICD-10-CM

## 2023-04-14 DIAGNOSIS — H3530 Unspecified macular degeneration: Secondary | ICD-10-CM | POA: Diagnosis present

## 2023-04-14 DIAGNOSIS — E039 Hypothyroidism, unspecified: Secondary | ICD-10-CM | POA: Diagnosis present

## 2023-04-14 DIAGNOSIS — Z1611 Resistance to penicillins: Secondary | ICD-10-CM | POA: Diagnosis present

## 2023-04-14 DIAGNOSIS — Z1152 Encounter for screening for COVID-19: Secondary | ICD-10-CM

## 2023-04-14 DIAGNOSIS — N1831 Chronic kidney disease, stage 3a: Secondary | ICD-10-CM | POA: Diagnosis present

## 2023-04-14 DIAGNOSIS — E785 Hyperlipidemia, unspecified: Secondary | ICD-10-CM | POA: Diagnosis present

## 2023-04-14 DIAGNOSIS — Z9889 Other specified postprocedural states: Secondary | ICD-10-CM

## 2023-04-14 DIAGNOSIS — Z7982 Long term (current) use of aspirin: Secondary | ICD-10-CM

## 2023-04-14 DIAGNOSIS — Z8616 Personal history of COVID-19: Secondary | ICD-10-CM

## 2023-04-14 DIAGNOSIS — Z79899 Other long term (current) drug therapy: Secondary | ICD-10-CM

## 2023-04-14 LAB — CBC
HCT: 29.2 % — ABNORMAL LOW (ref 36.0–46.0)
Hemoglobin: 9.7 g/dL — ABNORMAL LOW (ref 12.0–15.0)
MCH: 27.2 pg (ref 26.0–34.0)
MCHC: 33.2 g/dL (ref 30.0–36.0)
MCV: 82 fL (ref 80.0–100.0)
Platelets: 291 10*3/uL (ref 150–400)
RBC: 3.56 MIL/uL — ABNORMAL LOW (ref 3.87–5.11)
RDW: 13.6 % (ref 11.5–15.5)
WBC: 19.8 10*3/uL — ABNORMAL HIGH (ref 4.0–10.5)
nRBC: 0 % (ref 0.0–0.2)

## 2023-04-14 LAB — BASIC METABOLIC PANEL
Anion gap: 12 (ref 5–15)
BUN: 24 mg/dL — ABNORMAL HIGH (ref 8–23)
CO2: 21 mmol/L — ABNORMAL LOW (ref 22–32)
Calcium: 8.3 mg/dL — ABNORMAL LOW (ref 8.9–10.3)
Chloride: 99 mmol/L (ref 98–111)
Creatinine, Ser: 1.52 mg/dL — ABNORMAL HIGH (ref 0.44–1.00)
GFR, Estimated: 33 mL/min — ABNORMAL LOW (ref >60)
Glucose, Bld: 125 mg/dL — ABNORMAL HIGH (ref 70–99)
Potassium: 3.6 mmol/L (ref 3.5–5.1)
Sodium: 132 mmol/L — ABNORMAL LOW (ref 135–145)

## 2023-04-14 NOTE — ED Triage Notes (Signed)
First Nurse Note: Patient to ED via [facility] from home for multiple falls. Denies LOC. Having weakness in bialteral legs. "Has not felt right this week" and having intermittent Dawn Dickerson stools. VS WNL

## 2023-04-14 NOTE — ED Triage Notes (Signed)
Pt presents to the ED via [facility] from home for multiple falls and weakness. Pt states that she had to sit down today while in the bathroom because she was so weak. Pt states that this has been going on for a little over a week.

## 2023-04-15 ENCOUNTER — Inpatient Hospital Stay: Payer: Medicare Other

## 2023-04-15 ENCOUNTER — Other Ambulatory Visit: Payer: Self-pay

## 2023-04-15 ENCOUNTER — Inpatient Hospital Stay
Admission: EM | Admit: 2023-04-15 | Discharge: 2023-04-17 | DRG: 872 | Disposition: A | Discharge status: Home Health | Payer: Medicare Other | Attending: Internal Medicine | Admitting: Internal Medicine

## 2023-04-15 ENCOUNTER — Emergency Department: Payer: Medicare Other

## 2023-04-15 ENCOUNTER — Telehealth: Payer: Self-pay

## 2023-04-15 DIAGNOSIS — H409 Unspecified glaucoma: Secondary | ICD-10-CM | POA: Diagnosis present

## 2023-04-15 DIAGNOSIS — Z1611 Resistance to penicillins: Secondary | ICD-10-CM | POA: Diagnosis present

## 2023-04-15 DIAGNOSIS — R296 Repeated falls: Secondary | ICD-10-CM | POA: Diagnosis present

## 2023-04-15 DIAGNOSIS — I129 Hypertensive chronic kidney disease with stage 1 through stage 4 chronic kidney disease, or unspecified chronic kidney disease: Secondary | ICD-10-CM | POA: Diagnosis present

## 2023-04-15 DIAGNOSIS — I10 Essential (primary) hypertension: Secondary | ICD-10-CM | POA: Diagnosis not present

## 2023-04-15 DIAGNOSIS — E039 Hypothyroidism, unspecified: Secondary | ICD-10-CM | POA: Diagnosis present

## 2023-04-15 DIAGNOSIS — D649 Anemia, unspecified: Secondary | ICD-10-CM | POA: Diagnosis present

## 2023-04-15 DIAGNOSIS — F32A Depression, unspecified: Secondary | ICD-10-CM | POA: Diagnosis present

## 2023-04-15 DIAGNOSIS — J40 Bronchitis, not specified as acute or chronic: Secondary | ICD-10-CM | POA: Diagnosis present

## 2023-04-15 DIAGNOSIS — N179 Acute kidney failure, unspecified: Secondary | ICD-10-CM | POA: Diagnosis present

## 2023-04-15 DIAGNOSIS — I471 Supraventricular tachycardia, unspecified: Secondary | ICD-10-CM | POA: Diagnosis not present

## 2023-04-15 DIAGNOSIS — H3530 Unspecified macular degeneration: Secondary | ICD-10-CM | POA: Diagnosis present

## 2023-04-15 DIAGNOSIS — Z1152 Encounter for screening for COVID-19: Secondary | ICD-10-CM | POA: Diagnosis not present

## 2023-04-15 DIAGNOSIS — A414 Sepsis due to anaerobes: Secondary | ICD-10-CM | POA: Diagnosis present

## 2023-04-15 DIAGNOSIS — J90 Pleural effusion, not elsewhere classified: Secondary | ICD-10-CM | POA: Diagnosis present

## 2023-04-15 DIAGNOSIS — Z8616 Personal history of COVID-19: Secondary | ICD-10-CM | POA: Diagnosis not present

## 2023-04-15 DIAGNOSIS — E785 Hyperlipidemia, unspecified: Secondary | ICD-10-CM | POA: Diagnosis present

## 2023-04-15 DIAGNOSIS — M810 Age-related osteoporosis without current pathological fracture: Secondary | ICD-10-CM | POA: Diagnosis present

## 2023-04-15 DIAGNOSIS — I2489 Other forms of acute ischemic heart disease: Secondary | ICD-10-CM | POA: Diagnosis present

## 2023-04-15 DIAGNOSIS — N1831 Chronic kidney disease, stage 3a: Secondary | ICD-10-CM | POA: Diagnosis present

## 2023-04-15 DIAGNOSIS — I272 Pulmonary hypertension, unspecified: Secondary | ICD-10-CM | POA: Diagnosis present

## 2023-04-15 DIAGNOSIS — A419 Sepsis, unspecified organism: Secondary | ICD-10-CM | POA: Diagnosis present

## 2023-04-15 DIAGNOSIS — A4159 Other Gram-negative sepsis: Secondary | ICD-10-CM | POA: Diagnosis present

## 2023-04-15 DIAGNOSIS — L409 Psoriasis, unspecified: Secondary | ICD-10-CM | POA: Diagnosis present

## 2023-04-15 DIAGNOSIS — N390 Urinary tract infection, site not specified: Secondary | ICD-10-CM | POA: Diagnosis present

## 2023-04-15 DIAGNOSIS — R531 Weakness: Secondary | ICD-10-CM

## 2023-04-15 DIAGNOSIS — R7989 Other specified abnormal findings of blood chemistry: Secondary | ICD-10-CM | POA: Diagnosis present

## 2023-04-15 DIAGNOSIS — B961 Klebsiella pneumoniae [K. pneumoniae] as the cause of diseases classified elsewhere: Secondary | ICD-10-CM | POA: Diagnosis present

## 2023-04-15 DIAGNOSIS — A4150 Gram-negative sepsis, unspecified: Secondary | ICD-10-CM | POA: Diagnosis not present

## 2023-04-15 DIAGNOSIS — J9811 Atelectasis: Secondary | ICD-10-CM | POA: Diagnosis present

## 2023-04-15 LAB — PROCALCITONIN: Procalcitonin: 5.39 ng/mL

## 2023-04-15 LAB — URINALYSIS, ROUTINE W REFLEX MICROSCOPIC
Bilirubin Urine: NEGATIVE
Glucose, UA: NEGATIVE mg/dL
Ketones, ur: NEGATIVE mg/dL
Nitrite: NEGATIVE
Protein, ur: 100 mg/dL — AB
RBC / HPF: >50 RBC/hpf (ref 0–5)
Specific Gravity, Urine: 1.018 (ref 1.005–1.030)
WBC, UA: >50 WBC/hpf (ref 0–5)
pH: 5 (ref 5.0–8.0)

## 2023-04-15 LAB — BASIC METABOLIC PANEL
Anion gap: 13 (ref 5–15)
BUN: 23 mg/dL (ref 8–23)
CO2: 20 mmol/L — ABNORMAL LOW (ref 22–32)
Calcium: 7.8 mg/dL — ABNORMAL LOW (ref 8.9–10.3)
Chloride: 103 mmol/L (ref 98–111)
Creatinine, Ser: 1.29 mg/dL — ABNORMAL HIGH (ref 0.44–1.00)
GFR, Estimated: 40 mL/min — ABNORMAL LOW (ref >60)
Glucose, Bld: 120 mg/dL — ABNORMAL HIGH (ref 70–99)
Potassium: 3.6 mmol/L (ref 3.5–5.1)
Sodium: 136 mmol/L (ref 135–145)

## 2023-04-15 LAB — RESP PANEL BY RT-PCR (RSV, FLU A&B, COVID)  RVPGX2
Influenza A by PCR: NEGATIVE
Influenza B by PCR: NEGATIVE
Resp Syncytial Virus by PCR: NEGATIVE
SARS Coronavirus 2 by RT PCR: NEGATIVE

## 2023-04-15 LAB — HEPATIC FUNCTION PANEL
ALT: 8 U/L (ref 0–44)
AST: 72 U/L — ABNORMAL HIGH (ref 15–41)
Albumin: 2.9 g/dL — ABNORMAL LOW (ref 3.5–5.0)
Alkaline Phosphatase: 128 U/L — ABNORMAL HIGH (ref 38–126)
Bilirubin, Direct: 0.6 mg/dL — ABNORMAL HIGH (ref 0.0–0.2)
Indirect Bilirubin: 1.1 mg/dL — ABNORMAL HIGH (ref 0.3–0.9)
Total Bilirubin: 1.7 mg/dL — ABNORMAL HIGH (ref 0.3–1.2)
Total Protein: 6.8 g/dL (ref 6.5–8.1)

## 2023-04-15 LAB — TROPONIN I (HIGH SENSITIVITY)
Troponin I (High Sensitivity): 90 ng/L — ABNORMAL HIGH (ref <18)
Troponin I (High Sensitivity): 85 ng/L — ABNORMAL HIGH (ref <18)
Troponin I (High Sensitivity): 50 ng/L — ABNORMAL HIGH
Troponin I (High Sensitivity): 47 ng/L — ABNORMAL HIGH

## 2023-04-15 LAB — GLUCOSE, CAPILLARY: Glucose-Capillary: 98 mg/dL (ref 70–99)

## 2023-04-15 LAB — HIV ANTIBODY (ROUTINE TESTING W REFLEX): HIV Screen 4th Generation wRfx: NONREACTIVE

## 2023-04-15 LAB — SURGICAL PATHOLOGY

## 2023-04-15 LAB — TYPE AND SCREEN
ABO/RH(D): AB POS
Antibody Screen: NEGATIVE

## 2023-04-15 LAB — D-DIMER, QUANTITATIVE: D-Dimer, Quant: 2.81 ug{FEU}/mL — ABNORMAL HIGH (ref 0.00–0.50)

## 2023-04-15 LAB — LACTIC ACID, PLASMA: Lactic Acid, Venous: 1.2 mmol/L (ref 0.5–1.9)

## 2023-04-15 LAB — BRAIN NATRIURETIC PEPTIDE: B Natriuretic Peptide: 509.9 pg/mL — ABNORMAL HIGH (ref 0.0–100.0)

## 2023-04-15 LAB — MAGNESIUM: Magnesium: 1.9 mg/dL (ref 1.7–2.4)

## 2023-04-15 MED ORDER — LACTATED RINGERS IV BOLUS
500.0000 mL | Freq: Once | INTRAVENOUS | Status: AC
  Administered 2023-04-15: 500 mL via INTRAVENOUS

## 2023-04-15 MED ORDER — ENOXAPARIN SODIUM 30 MG/0.3ML IJ SOSY
30.0000 mg | PREFILLED_SYRINGE | INTRAMUSCULAR | Status: DC
  Filled 2023-04-15: qty 0.3

## 2023-04-15 MED ORDER — SODIUM CHLORIDE 0.9 % IV SOLN
500.0000 mg | INTRAVENOUS | Status: DC
  Administered 2023-04-15 – 2023-04-17 (×3): 500 mg via INTRAVENOUS
  Filled 2023-04-15 (×3): qty 5

## 2023-04-15 MED ORDER — VANCOMYCIN HCL 1500 MG/300ML IV SOLN
1500.0000 mg | Freq: Once | INTRAVENOUS | Status: AC
  Administered 2023-04-15: 1500 mg via INTRAVENOUS
  Filled 2023-04-15: qty 300

## 2023-04-15 MED ORDER — ONDANSETRON HCL 4 MG/2ML IJ SOLN: 4.0000 mg | Freq: Four times a day (QID) | INTRAMUSCULAR | Status: DC | PRN

## 2023-04-15 MED ORDER — HEPARIN (PORCINE) 25000 UT/250ML-% IV SOLN
1100.0000 [IU]/h | INTRAVENOUS | Status: DC
  Administered 2023-04-15: 1100 [IU]/h via INTRAVENOUS
  Filled 2023-04-15: qty 250

## 2023-04-15 MED ORDER — LEVALBUTEROL HCL 0.63 MG/3ML IN NEBU
0.6300 mg | INHALATION_SOLUTION | Freq: Four times a day (QID) | RESPIRATORY_TRACT | Status: DC | PRN
  Administered 2023-04-15: 0.63 mg via RESPIRATORY_TRACT
  Filled 2023-04-15 (×2): qty 3

## 2023-04-15 MED ORDER — SODIUM CHLORIDE 0.9 % IV SOLN: INTRAVENOUS | Status: DC

## 2023-04-15 MED ORDER — PREDNISONE 20 MG PO TABS: 50.0000 mg | ORAL_TABLET | Freq: Every day | ORAL | Status: DC

## 2023-04-15 MED ORDER — PREDNISONE 20 MG PO TABS
50.0000 mg | ORAL_TABLET | Freq: Every day | ORAL | Status: DC
  Administered 2023-04-15 – 2023-04-17 (×2): 50 mg via ORAL
  Filled 2023-04-15 (×2): qty 1

## 2023-04-15 MED ORDER — METOPROLOL SUCCINATE ER 25 MG PO TB24
25.0000 mg | ORAL_TABLET | Freq: Every day | ORAL | Status: DC
  Administered 2023-04-15 – 2023-04-17 (×3): 25 mg via ORAL
  Filled 2023-04-15 (×3): qty 1

## 2023-04-15 MED ORDER — ASPIRIN 81 MG PO CHEW
324.0000 mg | CHEWABLE_TABLET | Freq: Once | ORAL | Status: AC
  Administered 2023-04-15: 324 mg via ORAL
  Filled 2023-04-15: qty 4

## 2023-04-15 MED ORDER — SODIUM CHLORIDE 0.9 % IV BOLUS (SEPSIS)
500.0000 mL | Freq: Once | INTRAVENOUS | Status: AC
  Administered 2023-04-15: 500 mL via INTRAVENOUS

## 2023-04-15 MED ORDER — SODIUM CHLORIDE 0.9 % IV SOLN
2.0000 g | Freq: Once | INTRAVENOUS | Status: AC
  Administered 2023-04-15: 2 g via INTRAVENOUS
  Filled 2023-04-15: qty 12.5

## 2023-04-15 MED ORDER — SODIUM CHLORIDE 0.9 % IV SOLN
2.0000 g | INTRAVENOUS | Status: DC
  Administered 2023-04-15 – 2023-04-16 (×2): 2 g via INTRAVENOUS
  Filled 2023-04-15 (×3): qty 20

## 2023-04-15 MED ORDER — INFLUENZA VAC A&B SURF ANT ADJ 0.5 ML IM SUSY
0.5000 mL | PREFILLED_SYRINGE | INTRAMUSCULAR | Status: DC
  Filled 2023-04-15: qty 0.5

## 2023-04-15 MED ORDER — ACETAMINOPHEN 325 MG PO TABS
650.0000 mg | ORAL_TABLET | Freq: Four times a day (QID) | ORAL | Status: DC | PRN
  Administered 2023-04-15 – 2023-04-17 (×2): 650 mg via ORAL
  Filled 2023-04-15 (×2): qty 2

## 2023-04-15 MED ORDER — HEPARIN BOLUS VIA INFUSION
4000.0000 [IU] | Freq: Once | INTRAVENOUS | Status: AC
  Administered 2023-04-15: 4000 [IU] via INTRAVENOUS
  Filled 2023-04-15: qty 4000

## 2023-04-15 MED ORDER — FUROSEMIDE 10 MG/ML IJ SOLN
20.0000 mg | Freq: Once | INTRAMUSCULAR | Status: DC
  Filled 2023-04-15: qty 4

## 2023-04-15 MED ORDER — IOHEXOL 350 MG/ML SOLN
60.0000 mL | Freq: Once | INTRAVENOUS | Status: AC | PRN
  Administered 2023-04-15: 60 mL via INTRAVENOUS

## 2023-04-15 MED ORDER — POTASSIUM CHLORIDE CRYS ER 20 MEQ PO TBCR
40.0000 meq | EXTENDED_RELEASE_TABLET | Freq: Once | ORAL | Status: AC
  Administered 2023-04-16: 40 meq via ORAL
  Filled 2023-04-15: qty 2

## 2023-04-15 MED ORDER — ORAL CARE MOUTH RINSE: 15.0000 mL | OROMUCOSAL | Status: DC | PRN

## 2023-04-15 MED ORDER — ONDANSETRON HCL 4 MG PO TABS: 4.0000 mg | ORAL_TABLET | Freq: Four times a day (QID) | ORAL | Status: DC | PRN

## 2023-04-15 NOTE — Assessment & Plan Note (Signed)
Meeting sepsis criteria with heart rate 100s, white count of 19 Urinalysis indicative of infection Chest x-ray fairly stable though with bronchitic changes No hypoxia present Will place on Rocephin and azithromycin for broad coverage Procalcitonin 5 Panculture Monitor

## 2023-04-15 NOTE — Consult Note (Signed)
ANTICOAGULATION CONSULT NOTE - Initial Consult  Pharmacy Consult for heparin infusion Indication: pulmonary embolus  Allergies  Allergen Reactions   Methotrexate Derivatives Shortness Of Breath   Prednisone Other (See Comments)    Patient called and left message she could not take    Alendronate Diarrhea    Patient Measurements: Height: 5\' 5"  (165.1 cm) Weight: 63.5 kg (140 lb) IBW/kg (Calculated) : 57 Heparin Dosing Weight: 63.5 kg  Vital Signs: Temp: 98.5 F (36.9 C) (09/17 1855) Temp Source: Oral (09/17 1852) BP: 152/66 (09/17 1855) Pulse Rate: 124 (09/17 1855)  Labs: Recent Labs    04/14/23 1909 04/15/23 0405 04/15/23 0855 04/15/23 1051 04/15/23 1933  HGB 9.7*  --   --   --   --   HCT 29.2*  --   --   --   --   PLT 291  --   --   --   --   CREATININE 1.52*  --   --   --  1.29*  TROPONINIHS  --  85* 50* 47*  --     Estimated Creatinine Clearance: 26.6 mL/min (A) (by C-G formula based on SCr of 1.29 mg/dL (H)).   Medical History: Past Medical History:  Diagnosis Date   Actinic keratosis    Cancer (HCC)    skin ca, right forearm   Glaucoma    Hyperlipemia    Macular degeneration    Osteoporosis    Psoriasis    Pulmonary hypertension (HCC)    SCC (squamous cell carcinoma) 03/25/2023   right anterior thigh, excision 04/09/23   Thyroid disease     Medications:  No prior anticoagulation noted   Assessment: 89 y.o. female with medical history significant of HTN, HLD, pulmonary hypertension, presented with increased SOB. Noted elevated d-dimer of 2.81, concern for PE. VQ scan ordered. Pharmacy has been consulted to initiate and manage IV heparin therapy.    Goal of Therapy:  Heparin level 0.3-0.7 units/ml Monitor platelets by anticoagulation protocol: Yes   Plan:  Give 4000 units bolus x 1 Start heparin infusion at 1100 units/hr Check anti-Xa level in 8 hours and daily while on heparin Continue to monitor H&H and platelets  Sharen Hones,  PharmD, BCPS Clinical Pharmacist   04/15/2023,9:24 PM

## 2023-04-15 NOTE — Assessment & Plan Note (Signed)
Continue Synthroid °

## 2023-04-15 NOTE — Assessment & Plan Note (Signed)
Urinalysis indicative of infection in the setting of sepsis IV Rocephin Urine culture Monitor

## 2023-04-15 NOTE — Telephone Encounter (Signed)
Called patient. N/A. LMOVM called regarding pathology results and to check on surgical wound. C/B if any problems, otherwise will discuss at appt for suture removal.

## 2023-04-15 NOTE — Telephone Encounter (Signed)
-----   Message from [place] sent at 04/14/2023  9:27 PM EDT ----- Diagnosis Skin (M), right anterior thigh :       EXCISION, RESIDUAL SQUAMOUS CELL CARCINOMA, MARGINS FREE    Please call to share that excision was clear of skin cancer and get update on surgical wound. Thank you.

## 2023-04-15 NOTE — Assessment & Plan Note (Signed)
Positive bronchitic changes on chest x-ray with noted cough as well as presentation consistent with sepsis No hypoxia present Will place on Rocephin azithromycin for broad coverage Blood and respiratory cultures Monitor

## 2023-04-15 NOTE — H&P (Signed)
History and Physical    Patient: Dawn Dickerson ZOX:[id number] DOB: Dec 24, 1931 DOA: 04/15/2023 DOS: the patient was seen and examined on 04/15/2023 PCP: Lynnea Ferrier, MD  Patient coming from: Home  Chief Complaint:  Chief Complaint  Patient presents with   Weakness   HPI: Dawn Dickerson is a 89 y.o. female with medical history significant of hypertension, hyperlipidemia, pulmonary hypertension, hypothyroidism presenting with sepsis, UTI, bronchitis.  Patient reports generalized weakness and malaise over the past 24 hours.  Mild chills.  Right-sided abdominal pain versus flank pain.  No frank dysuria or increased urinary frequency.  Positive foul-smelling urine over the past 24 hours.  Positive cough.  No reported chest pain nausea or vomiting.  Non-smoker.  No reported alcohol use. Presented to the ER afebrile, heart rate 100s, respiration stable, blood pressure 100s to 130s over 30s to 70s..  White count 19.8, hemoglobin 9.7, urinalysis indicative of infection.  Troponin 80s to 90s.  COVID flu and RSV negative.  Creatinine 1.52.  T. bili 1.7.  Procalcitonin 5. Review of Systems: As mentioned in the history of present illness. All other systems reviewed and are negative. Past Medical History:  Diagnosis Date   Actinic keratosis    Cancer (HCC)    skin ca, right forearm   Glaucoma    Hyperlipemia    Macular degeneration    Osteoporosis    Psoriasis    Pulmonary hypertension (HCC)    SCC (squamous cell carcinoma) 03/25/2023   right anterior thigh, excision 04/09/23   Thyroid disease    Past Surgical History:  Procedure Laterality Date   BREAST BIOPSY Bilateral    neg   TONSILLECTOMY     Social History:  reports that she has never smoked. She has never used smokeless tobacco. She reports that she does not drink alcohol and does not use drugs.  Allergies  Allergen Reactions   Methotrexate Derivatives Shortness Of Breath   Prednisone Other (See Comments)    Patient called  and left message she could not take    Alendronate Diarrhea    Family History  Problem Relation Age of Onset   Hyperlipidemia Mother    Coronary artery disease Mother    Breast cancer Neg Hx     Prior to Admission medications   Medication Sig Start Date End Date Taking? Authorizing Provider  aspirin EC 81 MG tablet Take by mouth.    [provider]  calcipotriene (DOVONOX) 0.005 % cream Apply 1-2 times daily to affected areas of psoriasis 11/25/22   Willeen Niece, MD  Calcium 600-400 MG-UNIT CHEW Chew by mouth.    [provider]  Cholecalciferol (VITAMIN D-1000 MAX ST) 25 MCG (1000 UT) tablet Take by mouth. 12/05/21   [provider]  clobetasol cream (TEMOVATE) 0.05 % Spot treat affected areas psoriasis once to twice daily until improved. Avoid face, groin, axilla. 10/08/22   Willeen Niece, MD  Deucravacitinib (SOTYKTU) 6 MG TABS Take 1 tablet by mouth daily. Patient not taking: Reported on 11/25/2022 10/08/22   Willeen Niece, MD  escitalopram (LEXAPRO) 10 MG tablet Take 1 tablet by mouth daily. 01/03/22   [provider]  levothyroxine (SYNTHROID, LEVOTHROID) 175 MCG tablet Take 175 mcg by mouth daily before breakfast.    [provider]  losartan (COZAAR) 25 MG tablet Take 1 tablet (25 mg total) by mouth daily. 11/17/21 12/17/21  Merwyn Katos, MD  metoprolol succinate (TOPROL-XL) 25 MG 24 hr tablet Take 25 mg by mouth  daily. 02/28/22   [provider]  Multiple Vitamin (MULTIVITAMIN) tablet Take 1 tablet by mouth daily.    [provider]  Multiple Vitamins-Minerals (PRESERVISION AREDS 2+MULTI VIT PO) Take by mouth.    [provider]  mupirocin ointment (BACTROBAN) 2 % Apply once daily with bandage change. 04/09/23   Elie Goody, MD  simvastatin (ZOCOR) 20 MG tablet Take 20 mg by mouth daily.    [provider]  timolol (BETIMOL) 0.5 % ophthalmic solution Place 1 drop into both eyes 2 (two) times daily.     [provider]    Physical Exam: Vitals:   04/15/23 0600 04/15/23 0610 04/15/23 0647 04/15/23 0730  BP:    (!) 116/59  Pulse: 88   88  Resp: 20   (!) 25  Temp:  98.7 F (37.1 C)    TempSrc:  Oral    SpO2:   100% 100%  Weight:      Height:       Physical Exam Constitutional:      Appearance: She is normal weight.  HENT:     Head: Normocephalic and atraumatic.     Nose: Nose normal.     Mouth/Throat:     Mouth: Mucous membranes are dry.  Eyes:     Pupils: Pupils are equal, round, and reactive to light.  Cardiovascular:     Pulses: Normal pulses.     Heart sounds: Normal heart sounds.  Abdominal:     General: Bowel sounds are normal.  Musculoskeletal:        General: Normal range of motion.     Cervical back: Normal range of motion.  Skin:    General: Skin is dry.  Neurological:     General: No focal deficit present.     Data Reviewed:  There are no new results to review at this time. DG Chest Portable 1 View CLINICAL DATA:  Multiple falls and weakness. Evaluation for pneumonia.  EXAM: PORTABLE CHEST 1 VIEW  COMPARISON:  Radiograph 12/21/2016  FINDINGS: Stable cardiomegaly. Aortic atherosclerotic calcification. Chronic bronchitic changes. No focal consolidation. Trace bilateral pleural effusions or pleural thickening. No pneumothorax. No displaced rib fractures.  IMPRESSION: Chronic bronchitic changes.  Trace bilateral pleural effusions or pleural thickening.  Electronically Signed   By: Minerva Fester M.D.   On: 04/15/2023 02:26  Lab Results  Component Value Date   WBC 19.8 (H) 04/14/2023   HGB 9.7 (L) 04/14/2023   HCT 29.2 (L) 04/14/2023   MCV 82.0 04/14/2023   PLT 291 04/14/2023   Last metabolic panel Lab Results  Component Value Date   GLUCOSE 125 (H) 04/14/2023   NA 132 (L) 04/14/2023   K 3.6 04/14/2023   CL 99 04/14/2023   CO2 21 (L) 04/14/2023   BUN 24 (H) 04/14/2023   CREATININE 1.52 (H) 04/14/2023   GFRNONAA 33  (L) 04/14/2023   CALCIUM 8.3 (L) 04/14/2023   PROT 6.8 04/14/2023   ALBUMIN 2.9 (L) 04/14/2023   BILITOT 1.7 (H) 04/14/2023   ALKPHOS 128 (H) 04/14/2023   AST 72 (H) 04/14/2023   ALT 8 04/14/2023   ANIONGAP 12 04/14/2023    Assessment and Plan: * Sepsis (HCC) Meeting sepsis criteria with heart rate 100s, white count of 19 Urinalysis indicative of infection Chest x-ray fairly stable though with bronchitic changes No hypoxia present Will place on Rocephin and azithromycin for broad coverage Procalcitonin 5 Panculture Monitor  AKI (acute kidney injury) (HCC) Creatinine 1.5 with GFR in the 30s  Clinically dry in the setting of sepsis IV fluid hydration Check FENa Renal imaging as clinically indicated Monitor  Elevated troponin Troponin 80s to 90s in the setting of active sepsis No active chest pain EKG normal sinus rhythm Status post aspirin Continue to trend Suspect secondary demand ischemia Monitor  Bronchitis Positive bronchitic changes on chest x-ray with noted cough as well as presentation consistent with sepsis No hypoxia present Will place on Rocephin azithromycin for broad coverage Blood and respiratory cultures Monitor  UTI (urinary tract infection) Urinalysis indicative of infection in the setting of sepsis IV Rocephin Urine culture Monitor  Hypothyroidism Continue Synthroid  Hyperlipidemia Continue statin      Advance Care Planning:   Code Status: Full Code   Consults: None   Family Communication: No family at the bedside   Severity of Illness: The appropriate patient status for this patient is INPATIENT. Inpatient status is judged to be reasonable and necessary in order to provide the required intensity of service to ensure the patient's safety. The patient's presenting symptoms, physical exam findings, and initial radiographic and laboratory data in the context of their chronic comorbidities is felt to place them at high risk for further  clinical deterioration. Furthermore, it is not anticipated that the patient will be medically stable for discharge from the hospital within 2 midnights of admission.   * I certify that at the point of admission it is my clinical judgment that the patient will require inpatient hospital care spanning beyond 2 midnights from the point of admission due to high intensity of service, high risk for further deterioration and high frequency of surveillance required.*  Author: Floydene Flock, MD 04/15/2023 7:58 AM  For on call review [email or web address].

## 2023-04-15 NOTE — ED Notes (Signed)
Pt to be transported to floor at this time. Stable at time of departure.

## 2023-04-15 NOTE — Consult Note (Signed)
WOC Nurse Consult Note: patient had skin cancer removed R anterior thigh; per dermatology recommendations has been placing Vaseline to area daily  Reason for Consult: wound R anterior thigh  Wound type: full thickness, surgical  Pressure Injury POA: NA  Measurement: 5 cm long incision w/sutures in place  Wound bed: unable to visualize  Drainage (amount, consistency, odor) minimal serosanguinous  Periwound: intact  Dressing procedure/placement/frequency: Clean R anterior thigh wound with NS, apply a layer of Vaseline gauze daily and cover with silicone foam. May lift foam daily to replace Vaseline gauze.  Change foam q3 days and prn soiling.   POC discussed with patient and bedside nurse.  WOC team will not follow at this time. Re-consult if further needs arise.  Thank you,    Priscella Mann MSN, RN-BC, [employer] [phone]

## 2023-04-15 NOTE — Progress Notes (Signed)
CROSS COVER NOTE  NAME: Dawn Dickerson MRN: [id number] DOB : [birth year]    Concern as stated by nurse / staff   pt admitted with sepsis/+UTI/bronchitis. just had a rapid at shift change  tele called and said she had a 26 beat run of SVT with HR up to 180 not sustained and now 94. checked on pt and she is fine said she did not feel any thing different.        Pertinent findings on chart review: -H&P from earlier today reviewed: - Patient admitted with sepsis of suspected respiratory source and possible UTI, AKI, elevated troponin -Had a rapid response due to acute respiratory distress-sepsis fluids DC'd - Placed empirically on heparin drip pending V/Q  Now having  Assessment and  Interventions   Assessment:  Ventricular tachycardia, nonsustained, asymptomatic Acute respiratory distress/rapid response at 7 PM of uncertain etiology  Plan:  Asymptomatic V. tach, electrolyte checks at 7 PM showed potassium 3.6 mag not done Repeat EKG Replete potassium above 4 and keep mag over 2 Got  toprol 25 tonight, will consider giving IV metoprolol if recurrence and NSVT On empiric heparin for possible PE. Will get CTA chest as patient's renal function has improved with hydration she received earlier Pending CTA chest, may resume sepsis fluids

## 2023-04-15 NOTE — Assessment & Plan Note (Signed)
Troponin 80s to 90s in the setting of active sepsis No active chest pain EKG normal sinus rhythm Status post aspirin Continue to trend Suspect secondary demand ischemia Monitor

## 2023-04-15 NOTE — Plan of Care (Signed)

## 2023-04-15 NOTE — ED Notes (Signed)
Pt ambulatory to the restroom at this time with assistance.

## 2023-04-15 NOTE — ED Notes (Signed)
Patient to CT at this time

## 2023-04-15 NOTE — Progress Notes (Signed)
PHARMACY -  BRIEF ANTIBIOTIC NOTE   Pharmacy has received consult(s) for Cefepime and Vancomycin from an ED provider.  The patient's profile has been reviewed for ht / wt / allergies / indication / available labs.    One time order(s) placed for: Cefepime 2 gm & Vancomycin 1500 mg per pt wt: 63.5 kg  Further antibiotics/pharmacy consults should be ordered by admitting physician if indicated.                       Thank you, Otelia Sergeant, PharmD, [facility] 04/15/2023 3:07 AM

## 2023-04-15 NOTE — Assessment & Plan Note (Signed)
Continue statin. 

## 2023-04-15 NOTE — Progress Notes (Signed)
D dimer 2.6 in setting of worsened resp status earlier in the evening  Will check VQ scan in setting of AKI  Place on heparin gtt pending VQ imaging  Follow closely

## 2023-04-15 NOTE — ED Notes (Signed)
Taking over care of pt at this time

## 2023-04-15 NOTE — Assessment & Plan Note (Signed)
Creatinine 1.5 with GFR in the 30s Clinically dry in the setting of sepsis IV fluid hydration Check FENa Renal imaging as clinically indicated Monitor

## 2023-04-15 NOTE — Progress Notes (Signed)
Received report by nursing about patient with increased work of breathing Patient evaluated the bedside Positive moderate increased work of breathing with wheezing and mild Rales Satting in mid 90s on 1-2L [place]  Heart rate into the 100s-sinus tach on EKG Resume home metoprolol Some concern for volume overload in the setting of IV fluids continue throughout the day Will DC IV fluids Stat BNP as well as portable chest x-ray Positive wheezing-add on prn Xopenex neb treatments IV Lasix as BP tolerates Po prednisone in setting of bronchitis D dimer x 1 Repeat BMP x 1 to assess renal function  Monitor resp status closely      04/15/2023    6:55 PM 04/15/2023    6:52 PM 04/15/2023    6:30 PM  Vitals with BMI  Systolic 152 152 161  Diastolic 66 65 70  Pulse   151

## 2023-04-15 NOTE — Progress Notes (Signed)
   Rapid Response Event Note    Reason for Call :  Heart rate 140-150. Elevated BP. Change from baseline   Initial Focused Assessment: Patient became short of breath. Audible upper airway wheezing. Elevated BP. Heart rate 140-150. Shaking all over           Interventions: EKG, labs and blood gas, metoprolol, prednisone, telemetry, chest x-ray,      Plan of Care: telemetry, metoprolol, prednisone. Lasix might be given after MD reviews chest x-ray        Event Summary: Patient stable   MD Notified: Dr Alvester Morin          Call Time: 1835 Arrival Time:1838 End [id number]   Retia Passe, RN  Order received from MD to change prednisone to start tonight

## 2023-04-15 NOTE — ED Provider Notes (Signed)
[facility] Provider Note    Event Date/Time   First MD Initiated Contact with Patient 04/15/23 0112     (approximate)   History   Weakness   HPI  Dawn Dickerson is a 89 y.o. female with history of pulmonary hypertension, hypothyroidism, hypertension, hyperlipidemia who presents to the emergency department with complaints of generalized weakness.  Has also had cough, congestion and low-grade temperatures of 99.  No chest pain or shortness of breath.  States she felt so weak today that she fell but describes it as sitting down on the floor on her bottom and she did not hit her head.  No vomiting or diarrhea.  No dysuria.  No focal tingling numbness or weakness.   History provided by patient and daughter.    Past Medical History:  Diagnosis Date   Actinic keratosis    Cancer (HCC)    skin ca, right forearm   Glaucoma    Hyperlipemia    Macular degeneration    Osteoporosis    Psoriasis    Pulmonary hypertension (HCC)    SCC (squamous cell carcinoma) 03/25/2023   right anterior thigh, excision 04/09/23   Thyroid disease     Past Surgical History:  Procedure Laterality Date   BREAST BIOPSY Bilateral    neg   TONSILLECTOMY      MEDICATIONS:  Prior to Admission medications   Medication Sig Start Date End Date Taking? Authorizing Provider  aspirin EC 81 MG tablet Take by mouth.    [provider]  calcipotriene (DOVONOX) 0.005 % cream Apply 1-2 times daily to affected areas of psoriasis 11/25/22   Willeen Niece, MD  Calcium 600-400 MG-UNIT CHEW Chew by mouth.    [provider]  Cholecalciferol (VITAMIN D-1000 MAX ST) 25 MCG (1000 UT) tablet Take by mouth. 12/05/21   [provider]  clobetasol cream (TEMOVATE) 0.05 % Spot treat affected areas psoriasis once to twice daily until improved. Avoid face, groin, axilla. 10/08/22   Willeen Niece, MD  Deucravacitinib (SOTYKTU) 6 MG TABS Take 1 tablet by mouth daily. Patient not  taking: Reported on 11/25/2022 10/08/22   Willeen Niece, MD  escitalopram (LEXAPRO) 10 MG tablet Take 1 tablet by mouth daily. 01/03/22   [provider]  levothyroxine (SYNTHROID, LEVOTHROID) 175 MCG tablet Take 175 mcg by mouth daily before breakfast.    [provider]  losartan (COZAAR) 25 MG tablet Take 1 tablet (25 mg total) by mouth daily. 11/17/21 12/17/21  Merwyn Katos, MD  metoprolol succinate (TOPROL-XL) 25 MG 24 hr tablet Take 25 mg by mouth daily. 02/28/22   [provider]  Multiple Vitamin (MULTIVITAMIN) tablet Take 1 tablet by mouth daily.    [provider]  Multiple Vitamins-Minerals (PRESERVISION AREDS 2+MULTI VIT PO) Take by mouth.    [provider]  mupirocin ointment (BACTROBAN) 2 % Apply once daily with bandage change. 04/09/23   Elie Goody, MD  simvastatin (ZOCOR) 20 MG tablet Take 20 mg by mouth daily.    [provider]  timolol (BETIMOL) 0.5 % ophthalmic solution Place 1 drop into both eyes 2 (two) times daily.    [provider]    Physical Exam   Triage Vital Signs: ED Triage Vitals  Encounter Vitals Group     BP 04/14/23 1902 (!) 122/50     Systolic BP Percentile --      Diastolic BP Percentile --      Pulse Rate 04/14/23 1902 Marland Kitchen)  102     Resp 04/14/23 1902 18     Temp 04/14/23 1902 98.5 F (36.9 C)     Temp Source 04/14/23 1902 Oral     SpO2 04/14/23 1902 92 %     Weight 04/14/23 1904 140 lb (63.5 kg)     Height 04/14/23 1904 5\' 5"  (1.651 m)     Head Circumference --      Peak Flow --      Pain Score 04/14/23 1904 0     Pain Loc --      Pain Education --      Exclude from Growth Chart --     Most recent vital signs: Vitals:   04/15/23 0216 04/15/23 0408  BP: (!) 138/52 120/79  Pulse: 98 93  Resp: 19 (!) 22  Temp: 98.7 F (37.1 C)   SpO2: 94% 96%    CONSTITUTIONAL: Alert, responds appropriately to questions. Well-appearing; well-nourished, elderly HEAD: Normocephalic,  atraumatic EYES: Conjunctivae clear, pupils appear equal, sclera nonicteric ENT: normal nose; moist mucous membranes NECK: Supple, normal ROM CARD: Regular and tachycardic; S1 and S2 appreciated RESP: Normal chest excursion without splinting or tachypnea; breath sounds clear and equal bilaterally; no wheezes, no rhonchi, no rales, no hypoxia or respiratory distress, speaking full sentences ABD/GI: Non-distended; soft, non-tender, no rebound, no guarding, no peritoneal signs BACK: The back appears normal EXT: Normal ROM in all joints; no deformity noted, no edema, no calf tenderness or calf swelling SKIN: Normal color for age and race; warm; no rash on exposed skin NEURO: Moves all extremities equally, normal speech PSYCH: The patient's mood and manner are appropriate.   ED Results / Procedures / Treatments   LABS: (all labs ordered are listed, but only abnormal results are displayed) Labs Reviewed  BASIC METABOLIC PANEL - Abnormal; Notable for the following components:      Result Value   Sodium 132 (*)    CO2 21 (*)    Glucose, Bld 125 (*)    BUN 24 (*)    Creatinine, Ser 1.52 (*)    Calcium 8.3 (*)    GFR, Estimated 33 (*)    All other components within normal limits  CBC - Abnormal; Notable for the following components:   WBC 19.8 (*)    RBC 3.56 (*)    Hemoglobin 9.7 (*)    HCT 29.2 (*)    All other components within normal limits  URINALYSIS, ROUTINE W REFLEX MICROSCOPIC - Abnormal; Notable for the following components:   Color, Urine YELLOW (*)    APPearance TURBID (*)    Hgb urine dipstick LARGE (*)    Protein, ur 100 (*)    Leukocytes,Ua MODERATE (*)    Bacteria, UA MANY (*)    Non Squamous Epithelial PRESENT (*)    All other components within normal limits  HEPATIC FUNCTION PANEL - Abnormal; Notable for the following components:   Albumin 2.9 (*)    AST 72 (*)    Alkaline Phosphatase 128 (*)    Total Bilirubin 1.7 (*)    Bilirubin, Direct 0.6 (*)    Indirect  Bilirubin 1.1 (*)    All other components within normal limits  TROPONIN I (HIGH SENSITIVITY) - Abnormal; Notable for the following components:   Troponin I (High Sensitivity) 90 (*)    All other components within normal limits  TROPONIN I (HIGH SENSITIVITY) - Abnormal; Notable for the following components:   Troponin I (High Sensitivity) 85 (*)    All  other components within normal limits  RESP PANEL BY RT-PCR (RSV, FLU A&B, COVID)  RVPGX2  URINE CULTURE  CULTURE, BLOOD (ROUTINE X 2)  CULTURE, BLOOD (ROUTINE X 2)  LACTIC ACID, PLASMA  PROCALCITONIN  TYPE AND SCREEN     EKG:  EKG Interpretation Date/Time:  Monday April 14 2023 19:08:55 EDT Ventricular Rate:  97 PR Interval:  124 QRS Duration:  90 QT Interval:  368 QTC Calculation: 467 R Axis:   -51  Text Interpretation: Normal sinus rhythm Left anterior fascicular block Moderate voltage criteria for LVH, may be normal variant ( R in aVL , Cornell product ) Possible Lateral infarct (cited on or before 17-Nov-2021) Abnormal ECG When compared with ECG of 17-Nov-2021 11:52, No significant change was found Confirmed by Rochele Raring [phone]) on 04/15/2023 1:24:12 AM         RADIOLOGY: My personal review and interpretation of imaging: Chest x-ray shows bronchitic changes.  I have personally reviewed all radiology reports.   DG Chest Portable 1 View  Result Date: 04/15/2023 CLINICAL DATA:  Multiple falls and weakness. Evaluation for pneumonia. EXAM: PORTABLE CHEST 1 VIEW COMPARISON:  Radiograph 12/21/2016 FINDINGS: Stable cardiomegaly. Aortic atherosclerotic calcification. Chronic bronchitic changes. No focal consolidation. Trace bilateral pleural effusions or pleural thickening. No pneumothorax. No displaced rib fractures. IMPRESSION: Chronic bronchitic changes. Trace bilateral pleural effusions or pleural thickening. Electronically Signed   By: Minerva Fester M.D.   On: 04/15/2023 02:26     PROCEDURES:  Critical Care  performed: Yes, see critical care procedure note(s)   CRITICAL CARE Performed by: Baxter Hire Eulis Salazar   Total critical care time: 35 minutes  Critical care time was exclusive of separately billable procedures and treating other patients.  Critical care was necessary to treat or prevent imminent or life-threatening deterioration.  Critical care was time spent personally by me on the following activities: development of treatment plan with patient and/or surrogate as well as nursing, discussions with consultants, evaluation of patient's response to treatment, examination of patient, obtaining history from patient or surrogate, ordering and performing treatments and interventions, ordering and review of laboratory studies, ordering and review of radiographic studies, pulse oximetry and re-evaluation of patient's condition.   Marland Kitchen1-3 Lead EKG Interpretation  Performed by: Gurvir Schrom, Layla Maw, DO Authorized by: Latifa Noble, Layla Maw, DO     Interpretation: abnormal     ECG rate:  102   ECG rate assessment: tachycardic     Rhythm: sinus tachycardia     Ectopy: none     Conduction: normal       IMPRESSION / MDM / ASSESSMENT AND PLAN / ED COURSE  I reviewed the triage vital signs and the nursing notes.    Patient here for generalized weakness, cough, congestion.  The patient is on the cardiac monitor to evaluate for evidence of arrhythmia and/or significant heart rate changes.   DIFFERENTIAL DIAGNOSIS (includes but not limited to):   Pneumonia, viral URI, UTI, dehydration, anemia, electrolyte derangement, ACS   Patient's presentation is most consistent with acute presentation with potential threat to life or bodily function.   PLAN: Labs obtained from triage show leukocytosis of 19,000.  She is also anemic with hemoglobin of 9.7 but denies bloody stools or melena.  Creatinine also slightly elevated from baseline to 1.52.  Will obtain troponin, procalcitonin, lactic, urinalysis, chest x-ray.  EKG  nonischemic.  Will give IV fluids for AKI.   MEDICATIONS GIVEN IN ED: Medications  vancomycin (VANCOREADY) IVPB 1500 mg/300 mL (1,500 mg Intravenous New  Bag/Given 04/15/23 0442)  0.9 %  sodium chloride infusion (has no administration in time range)  sodium chloride 0.9 % bolus 500 mL (0 mLs Intravenous Stopped 04/15/23 0358)  aspirin chewable tablet 324 mg (324 mg Oral Given 04/15/23 0246)  ceFEPIme (MAXIPIME) 2 g in sodium chloride 0.9 % 100 mL IVPB (0 g Intravenous Stopped 04/15/23 0436)     ED COURSE: Patient's lactic is normal.  Procalcitonin elevated at 5.  COVID and flu negative.  Troponin x 2 elevated which may be from demand ischemia but flat.  EKG is nonischemic.  Chest x-ray reviewed and interpreted by myself and the radiologist and shows bronchitic changes.  Urine is grossly infected.  She is getting vancomycin and cefepime for broad coverage.  Will discuss with hospitalist for admission.  Patient does meet criteria for sepsis with leukocytosis, heart rate in the 90s and intermittent tachypnea.  Blood cultures, urine culture pending.  No hypotension and normal lactic.  No indication for aggressive fluid hydration currently but will continue to give IV fluids.   CONSULTS:  Consulted and discussed patient's case with hospitalist, Dr. Para March.  I have recommended admission and consulting physician agrees and will place admission orders.  Patient (and family if present) agree with this plan.   I reviewed all nursing notes, vitals, pertinent previous records.  All labs, EKGs, imaging ordered have been independently reviewed and interpreted by myself.    OUTSIDE RECORDS REVIEWED: Reviewed last internal medicine note on 02/03/2023.       FINAL CLINICAL IMPRESSION(S) / ED DIAGNOSES   Final diagnoses:  Generalized weakness  Acute UTI  Elevated troponin     Rx / DC Orders   ED Discharge Orders     None        Note:  This document was prepared using Dragon voice recognition  software and may include unintentional dictation errors.   Jaleel Allen, Layla Maw, DO 04/15/23 [phone]

## 2023-04-15 NOTE — ED Notes (Signed)
Breakfast tray delivered. Patient eating at this time

## 2023-04-15 NOTE — ED Notes (Signed)
Up to toilet with walker to void and have bm.  Tolerated well.  Stayed on the one liter oxygen.

## 2023-04-15 NOTE — Progress Notes (Signed)
   04/15/23 1800  Spiritual Encounters  Type of Visit Initial  Care provided to: Patient  Referral source Code page  Reason for visit Code  OnCall Visit Yes   Chaplain responded to Rapid Response Code. No family present. Patient receiving attention from care team.

## 2023-04-16 ENCOUNTER — Other Ambulatory Visit: Payer: Medicare Other

## 2023-04-16 DIAGNOSIS — N390 Urinary tract infection, site not specified: Secondary | ICD-10-CM | POA: Diagnosis not present

## 2023-04-16 DIAGNOSIS — A4150 Gram-negative sepsis, unspecified: Secondary | ICD-10-CM | POA: Diagnosis not present

## 2023-04-16 DIAGNOSIS — H409 Unspecified glaucoma: Secondary | ICD-10-CM | POA: Diagnosis present

## 2023-04-16 DIAGNOSIS — F32A Depression, unspecified: Secondary | ICD-10-CM | POA: Diagnosis present

## 2023-04-16 MED ORDER — ALBUTEROL SULFATE (2.5 MG/3ML) 0.083% IN NEBU: 3.0000 mL | INHALATION_SOLUTION | RESPIRATORY_TRACT | Status: DC | PRN

## 2023-04-16 MED ORDER — LATANOPROST 0.005 % OP SOLN
1.0000 [drp] | Freq: Every day | OPHTHALMIC | Status: DC
  Administered 2023-04-17: 1 [drp] via OPHTHALMIC
  Filled 2023-04-16: qty 2.5

## 2023-04-16 MED ORDER — TIMOLOL MALEATE 0.5 % OP SOLN
1.0000 [drp] | Freq: Every day | OPHTHALMIC | Status: DC
  Administered 2023-04-16 – 2023-04-17 (×2): 1 [drp] via OPHTHALMIC
  Filled 2023-04-16: qty 5

## 2023-04-16 MED ORDER — LEVOTHYROXINE SODIUM 100 MCG PO TABS
100.0000 ug | ORAL_TABLET | Freq: Every day | ORAL | Status: DC
  Administered 2023-04-16 – 2023-04-17 (×2): 100 ug via ORAL
  Filled 2023-04-16 (×2): qty 1

## 2023-04-16 MED ORDER — MAGNESIUM SULFATE 2 GM/50ML IV SOLN
2.0000 g | Freq: Once | INTRAVENOUS | Status: AC
  Administered 2023-04-16: 2 g via INTRAVENOUS
  Filled 2023-04-16: qty 50

## 2023-04-16 MED ORDER — ASPIRIN 81 MG PO TBEC
81.0000 mg | DELAYED_RELEASE_TABLET | Freq: Every day | ORAL | Status: DC
  Administered 2023-04-16 – 2023-04-17 (×2): 81 mg via ORAL
  Filled 2023-04-16 (×2): qty 1

## 2023-04-16 MED ORDER — ENOXAPARIN SODIUM 40 MG/0.4ML IJ SOSY
40.0000 mg | PREFILLED_SYRINGE | INTRAMUSCULAR | Status: DC
  Administered 2023-04-16: 40 mg via SUBCUTANEOUS
  Filled 2023-04-16: qty 0.4

## 2023-04-16 MED ORDER — METOPROLOL TARTRATE 5 MG/5ML IV SOLN
5.0000 mg | Freq: Once | INTRAVENOUS | Status: AC | PRN
  Administered 2023-04-16: 5 mg via INTRAVENOUS
  Filled 2023-04-16: qty 5

## 2023-04-16 MED ORDER — GUAIFENESIN-DM 100-10 MG/5ML PO SYRP
5.0000 mL | ORAL_SOLUTION | ORAL | Status: DC | PRN
  Administered 2023-04-16 – 2023-04-17 (×2): 5 mL via ORAL
  Filled 2023-04-16 (×2): qty 10

## 2023-04-16 MED ORDER — SIMVASTATIN 20 MG PO TABS
20.0000 mg | ORAL_TABLET | Freq: Every day | ORAL | Status: DC
  Administered 2023-04-16 – 2023-04-17 (×2): 20 mg via ORAL
  Filled 2023-04-16 (×2): qty 1

## 2023-04-16 MED ORDER — ESCITALOPRAM OXALATE 10 MG PO TABS
10.0000 mg | ORAL_TABLET | Freq: Every day | ORAL | Status: DC
  Administered 2023-04-16 – 2023-04-17 (×2): 10 mg via ORAL
  Filled 2023-04-16 (×2): qty 1

## 2023-04-16 NOTE — Progress Notes (Signed)
Progress Note   Patient: Dawn Dickerson NWG:[id number] DOB: 24-Apr-1932 DOA: 04/15/2023     1 DOS: the patient was seen and examined on 04/16/2023   Brief hospital course: 89yo with h/o HTN, HLD, pulmonary HTN, and hypothyroidism who presented on 9/17 with weakness and malaise.  She reports that she had COVID several weeks ago and feels like she has had a cough since.  She became profoundly weak.  She was diagnosed with sepsis from UTI + bronchitis and treated with Rocephin/Azithromycin.  She was also noted to have AKI and elevated troponin that was attributed to demand ischemia.  Assessment and Plan:  Sepsis from UTI Meeting sepsis criteria with heart rate 100s, white count of 19 on presentation, primarily complained of profound weakness Urinalysis indicative of infection, culture with >100k colonies of Klebsiella pneumoniae Chest x-ray fairly stable though with bronchitic changes No hypoxia present Will place on Rocephin and azithromycin for broad coverage Blood cultures NTD x 1 day   AKI (acute kidney injury) (HCC) Creatinine 1.5 with GFR in the 30s, baseline GFR >60 Clinically dry in the setting of sepsis IV fluid hydration Improving Continue to follow Hold losartan   Elevated troponin Troponin 85 on presentation in the setting of active sepsis, improved into the 40s-50s No active chest pain EKG normal sinus rhythm This appears to be due to demand ischemia Continue ASA Can dc telemetry   Bronchitis Positive bronchitic changes on chest x-ray with noted cough  No hypoxia present but placed on 1L [place] O2 Will add albuterol Started on prednisone  HTN Hold losartan due to AKI Continue Toprol XL   Hypothyroidism Continue Synthroid   Hyperlipidemia Continue statin  Depression Continue escitalopram  Glaucoma  Continue latanoprost, timolol     Consultants: None  Procedures: None  Antibiotics: Ceftriaxone 9/17- Azithromycin 9/17-  30 Day Unplanned  Readmission Risk Score    Flowsheet Row ED to Hosp-Admission (Current) from 04/15/2023 in [facility]  30 Day Unplanned Readmission Risk Score (%) 12.5 Filed at 04/16/2023 0801       This score is the patient's risk of an unplanned readmission within 30 days of being discharged (0 -100%). The score is based on dignosis, age, lab data, medications, orders, and past utilization.   Low:  0-14.9   Medium: 15-21.9   High: 22-29.9   Extreme: 30 and above           Subjective: Feeling less fatigued, overall better.  No specific complaints.  She would be open to short-term rehab if needed.   Objective: Vitals:   04/16/23 0853 04/16/23 0854  BP: (!) 129/56 (!) 129/56  Pulse: 61 61  Resp:  18  Temp:  98 F (36.7 C)  SpO2:  100%    Intake/Output Summary (Last 24 hours) at 04/16/2023 1425 Last data filed at 04/16/2023 0402 Gross per 24 hour  Intake 2125.75 ml  Output 100 ml  Net 2025.75 ml   Filed Weights   04/14/23 1904  Weight: 63.5 kg    Exam:  General:  Appears calm and comfortable and is in NAD, sitting at bedside on 1L [place] O2 Eyes:  PERRL, EOMI, normal lids, iris ENT:  grossly normal hearing, lips & tongue, mmm Neck:  no LAD, masses or thyromegaly Cardiovascular:  RRR, no m/r/g. No LE edema.  Respiratory:   CTA bilaterally with no wheezes/rales/rhonchi.  Normal respiratory effort. Abdomen:  soft, NT, ND Skin:  no rash or induration seen on limited exam Musculoskeletal:  grossly normal tone BUE/BLE, good ROM, no bony abnormality Psychiatric:  grossly normal mood and affect, speech fluent and appropriate, AOx3 Neurologic:  CN 2-12 grossly intact, moves all extremities in coordinated fashion  Data Reviewed: I have reviewed the patient's lab results since admission.  Pertinent labs for today include:  Glucose 158 BUN 20/Creatinine 1.13/GFR 47; improved from 24/1.52/33 on 9/16 Albumin 2.4 WBC 11.9; 19.8 on 9/16 Hgb 8.4 CTA negative  for PE despite D-dimer 2.81 BNP 509.9 COVID/flu/RSV negative   Family Communication: None present; I spoke with her son by telephone  Disposition: Status is: Inpatient Remains inpatient appropriate because: ongoing evaluation and treatment     Time spent: 50 minutes  Unresulted Labs (From admission, onward)     Start     Ordered   04/17/23 0500  CBC with Differential/Platelet  Tomorrow morning,   R        04/16/23 1420   04/17/23 0500  Basic metabolic panel  Tomorrow morning,   R        04/16/23 1420   04/15/23 0800  Expectorated Sputum Assessment w Gram Stain, Rflx to Resp Cult  Once,   R        04/15/23 0759   04/15/23 0759  Legionella Pneumophila Serogp 1 Ur Ag  Once,   R        04/15/23 9629             Author: Jonah Blue, MD 04/16/2023 2:25 PM  For on call review [email or web address].

## 2023-04-16 NOTE — Plan of Care (Signed)

## 2023-04-16 NOTE — Hospital Course (Addendum)
89yo with h/o HTN, HLD, pulmonary HTN, and hypothyroidism who presented on 9/17 with weakness and malaise.  She reports that she had COVID several weeks ago and feels like she has had a cough since.  She became profoundly weak.  She was diagnosed with sepsis from UTI + bronchitis and treated with Rocephin/Azithromycin.  She was also noted to have AKI and elevated troponin that was attributed to demand ischemia.

## 2023-04-17 ENCOUNTER — Other Ambulatory Visit: Payer: Self-pay

## 2023-04-17 ENCOUNTER — Ambulatory Visit: Payer: Medicare Other | Admitting: Dermatology

## 2023-04-17 DIAGNOSIS — A414 Sepsis due to anaerobes: Secondary | ICD-10-CM

## 2023-04-17 LAB — BASIC METABOLIC PANEL
Anion gap: 4 — ABNORMAL LOW (ref 5–15)
BUN: 23 mg/dL (ref 8–23)
CO2: 23 mmol/L (ref 22–32)
Calcium: 8 mg/dL — ABNORMAL LOW (ref 8.9–10.3)
Chloride: 108 mmol/L (ref 98–111)
Creatinine, Ser: 1.12 mg/dL — ABNORMAL HIGH (ref 0.44–1.00)
GFR, Estimated: 47 mL/min — ABNORMAL LOW (ref >60)
Glucose, Bld: 129 mg/dL — ABNORMAL HIGH (ref 70–99)
Potassium: 4 mmol/L (ref 3.5–5.1)
Sodium: 135 mmol/L (ref 135–145)

## 2023-04-17 LAB — CBC WITH DIFFERENTIAL/PLATELET
Abs Immature Granulocytes: 0.1 10*3/uL — ABNORMAL HIGH (ref 0.00–0.07)
Basophils Absolute: 0 10*3/uL (ref 0.0–0.1)
Basophils Relative: 0 %
Eosinophils Absolute: 0.1 10*3/uL (ref 0.0–0.5)
Eosinophils Relative: 1 %
HCT: 27.4 % — ABNORMAL LOW (ref 36.0–46.0)
Hemoglobin: 8.8 g/dL — ABNORMAL LOW (ref 12.0–15.0)
Immature Granulocytes: 1 %
Lymphocytes Relative: 13 %
Lymphs Abs: 2 10*3/uL (ref 0.7–4.0)
MCH: 26.7 pg (ref 26.0–34.0)
MCHC: 32.1 g/dL (ref 30.0–36.0)
MCV: 83 fL (ref 80.0–100.0)
Monocytes Absolute: 1.3 10*3/uL — ABNORMAL HIGH (ref 0.1–1.0)
Monocytes Relative: 8 %
Neutro Abs: 12 10*3/uL — ABNORMAL HIGH (ref 1.7–7.7)
Neutrophils Relative %: 77 %
Platelets: 321 10*3/uL (ref 150–400)
RBC: 3.3 MIL/uL — ABNORMAL LOW (ref 3.87–5.11)
RDW: 14.3 % (ref 11.5–15.5)
WBC: 15.5 10*3/uL — ABNORMAL HIGH (ref 4.0–10.5)
nRBC: 0 % (ref 0.0–0.2)

## 2023-04-17 LAB — URINE CULTURE: Culture: >=100000 — AB

## 2023-04-17 LAB — LEGIONELLA PNEUMOPHILA SEROGP 1 UR AG: L. pneumophila Serogp 1 Ur Ag: NEGATIVE

## 2023-04-17 MED ORDER — DEXTROMETHORPHAN-GUAIFENESIN 20-200 MG/20ML PO LIQD
10.0000 mL | ORAL | 0 refills | Status: AC | PRN
  Filled 2023-04-17: qty 236, 10d supply, fill #0

## 2023-04-17 MED ORDER — CEPHALEXIN 500 MG PO CAPS
500.0000 mg | ORAL_CAPSULE | Freq: Two times a day (BID) | ORAL | Status: DC
  Administered 2023-04-17: 500 mg via ORAL
  Filled 2023-04-17: qty 1

## 2023-04-17 MED ORDER — ALBUTEROL SULFATE HFA 108 (90 BASE) MCG/ACT IN AERS
2.0000 | INHALATION_SPRAY | Freq: Four times a day (QID) | RESPIRATORY_TRACT | 2 refills | Status: AC | PRN
  Filled 2023-04-17: qty 6.7, 25d supply, fill #0

## 2023-04-17 MED ORDER — CEPHALEXIN 500 MG PO CAPS
500.0000 mg | ORAL_CAPSULE | Freq: Two times a day (BID) | ORAL | 0 refills | Status: AC
  Filled 2023-04-17: qty 10, 5d supply, fill #0

## 2023-04-17 NOTE — Plan of Care (Signed)
  Problem: Clinical Measurements: Goal: Ability to maintain clinical measurements within normal limits will improve Outcome: Progressing   Problem: Education: Goal: Knowledge of General Education information will improve Description: Including pain rating scale, medication(s)/side effects and non-pharmacologic comfort measures Outcome: Progressing   Problem: Clinical Measurements: Goal: Respiratory complications will improve Outcome: Progressing   Problem: Clinical Measurements: Goal: Cardiovascular complication will be avoided Outcome: Progressing   Problem: Skin Integrity: Goal: Risk for impaired skin integrity will decrease Outcome: Progressing

## 2023-04-17 NOTE — Discharge Summary (Signed)
Physician Discharge Summary   Patient: Dawn Dickerson MRN: [id number] DOB: 1932/02/14  Admit date:     04/15/2023  Discharge date: 04/17/23  Discharge Physician: Jonah Blue   PCP: Lynnea Ferrier, MD   Recommendations at discharge:   Complete antibiotics (Keflex to complete 7 days) Continue to hold losartan Follow up with Dr. Graciela Husbands in 1-2 weeks, will need repeat BMP Use albuterol inhaler, cough syrup as needed for cough Home health aide and PT/OT has been ordered  Discharge Diagnoses: Principal Problem:   Sepsis due to Klebsiella pneumoniae [facility]) Active Problems:   Accelerated hypertension   Hyperlipidemia   Hypothyroidism   UTI (urinary tract infection)   Bronchitis   Elevated troponin   AKI (acute kidney injury) [facility]   Depression   Glaucoma    Hospital Course: 89yo with h/o HTN, HLD, pulmonary HTN, and hypothyroidism who presented on 9/17 with weakness and malaise.  She reports that she had COVID several weeks ago and feels like she has had a cough since.  She became profoundly weak.  She was diagnosed with sepsis from UTI + bronchitis and treated with Rocephin/Azithromycin.  She was also noted to have AKI and elevated troponin that was attributed to demand ischemia.  Assessment and Plan:  Sepsis from Klebsiella pneumoniae UTI Meeting sepsis criteria with heart rate 100s, white count of 19 on presentation, primarily complained of profound weakness Urinalysis indicative of infection, culture with >100k colonies of Klebsiella pneumoniae Chest x-ray fairly stable though with bronchitic changes No hypoxia present Treated with Rocephin x 3 days, complete with Keflex for 7 total days Blood cultures NTD x 2 days   AKI (acute kidney injury) on Stage 3a CKD Creatinine 1.5 with GFR in the 30s, baseline stage 3a CKD Clinically dry in the setting of sepsis on presentation IV fluid hydration Appears to be back to baseline Continue to hold losartan for now   Elevated  troponin Troponin 85 on presentation in the setting of active sepsis, improved into the 40s-50s No active chest pain EKG normal sinus rhythm This appears to be due to demand ischemia Continue ASA   Bronchitis Positive bronchitic changes on chest x-ray with noted cough  No hypoxia present but placed on 1L [place] O2 which has been weaned back off Will add albuterol Started on prednisone, completed 3 days Will also add cough syrup for home   HTN Hold losartan due to renal dysfunction Continue Toprol XL   Hypothyroidism Continue Synthroid   Hyperlipidemia Continue statin   Depression Continue escitalopram   Glaucoma  Continue latanoprost, timolol         Consultants: None   Procedures: None   Antibiotics: Ceftriaxone 9/17-18 Azithromycin 9/17-19 Keflex 9/19-25     30 Day Unplanned Readmission Risk Score    Flowsheet Row ED to Hosp-Admission (Current) from 04/15/2023 in [facility]  30 Day Unplanned Readmission Risk Score (%) 14.62 Filed at 04/17/2023 0400       This score is the patient's risk of an unplanned readmission within 30 days of being discharged (0 -100%). The score is based on dignosis, age, lab data, medications, orders, and past utilization.   Low:  0-14.9   Medium: 15-21.9   High: 22-29.9   Extreme: 30 and above          Disposition: Home Diet recommendation:  Regular diet DISCHARGE MEDICATION: Allergies as of 04/17/2023       Reactions   Methotrexate Derivatives Shortness Of Breath  Prednisone Other (See Comments)   Patient called and left message she could not take    Alendronate Diarrhea        Medication List     STOP taking these medications    losartan 100 MG tablet Commonly known as: COZAAR       TAKE these medications    albuterol 108 (90 Base) MCG/ACT inhaler Commonly known as: VENTOLIN HFA Inhale 2 puffs into the lungs every 6 (six) hours as needed for wheezing or shortness  of breath.   aspirin EC 81 MG tablet Take by mouth.   Calcium 600-400 MG-UNIT Chew Chew by mouth.   cephALEXin 500 MG capsule Commonly known as: KEFLEX Take 1 capsule (500 mg total) by mouth every 12 (twelve) hours for 10 doses.   clobetasol cream 0.05 % Commonly known as: TEMOVATE Spot treat affected areas psoriasis once to twice daily until improved. Avoid face, groin, axilla. What changed:  how much to take how to take this when to take this   escitalopram 10 MG tablet Commonly known as: LEXAPRO Take 1 tablet by mouth daily.   guaiFENesin-dextromethorphan 100-10 MG/5ML syrup Commonly known as: ROBITUSSIN DM Take 5 mLs by mouth every 4 (four) hours as needed for cough (chest congestion).   latanoprost 0.005 % ophthalmic solution Commonly known as: XALATAN Place 1 drop into the right eye at bedtime.   levothyroxine 100 MCG tablet Commonly known as: SYNTHROID Take 100 mcg by mouth daily.   metoprolol succinate 25 MG 24 hr tablet Commonly known as: TOPROL-XL Take 25 mg by mouth daily.   multivitamin tablet Take 1 tablet by mouth daily.   mupirocin ointment 2 % Commonly known as: BACTROBAN Apply once daily with bandage change.   PRESERVISION AREDS 2+MULTI VIT PO Take by mouth.   simvastatin 20 MG tablet Commonly known as: ZOCOR Take 20 mg by mouth daily.   timolol 0.5 % ophthalmic solution Commonly known as: TIMOPTIC Place 1 drop into both eyes daily.   Vitamin D-1000 Max St 25 MCG (1000 UT) tablet Generic drug: Cholecalciferol Take 1,000 Units by mouth daily.        Discharge Exam: Filed Weights   04/14/23 1904  Weight: 63.5 kg     Subjective: She still feels very weak and tired.  However, she walked very well with therapy today and does not appear to need rehab. Family was present and will plan to stay with her as long as she needs them to.   Objective: Vitals:   04/17/23 0700 04/17/23 1027  BP: (!) 142/71   Pulse: 71   Resp: 18    Temp: 98 F (36.7 C)   SpO2: 96% 97%    Intake/Output Summary (Last 24 hours) at 04/17/2023 1127 Last data filed at 04/17/2023 0548 Gross per 24 hour  Intake 524.58 ml  Output --  Net 524.58 ml   Filed Weights   04/14/23 1904  Weight: 63.5 kg    Exam:  General:  Appears calm and comfortable and is in NAD, sitting at bedside on room air, periodic cough Eyes:   EOMI, normal lids, iris ENT:  grossly normal hearing, lips & tongue, mmm Neck:  no LAD, masses or thyromegaly Cardiovascular:  RRR, no m/r/g. No LE edema.  Respiratory:   CTA bilaterally with no wheezes/rales/rhonchi.  Normal respiratory effort. Abdomen:  soft, NT, ND Skin:  no rash or induration seen on limited exam Musculoskeletal:  grossly normal tone BUE/BLE, good ROM, no bony abnormality Psychiatric:  cantankerous  mood and affect, speech fluent and appropriate, AOx3 Neurologic:  CN 2-12 grossly intact, moves all extremities in coordinated fashion  Data Reviewed: I have reviewed the patient's lab results since admission.  Pertinent labs for today include:  Glucose 129 BUN 23/Creatinine 1.12/GFR 47 - stable WBC 15.5 Urine culture >100k colonies of Klebsiella pneumoniae, resistant to Ampicillin and Nitrofurantion    Condition at discharge: improving  The results of significant diagnostics from this hospitalization (including imaging, microbiology, ancillary and laboratory) are listed below for reference.   Imaging Studies: CT Angio Chest Pulmonary Embolism (PE) W or WO Contrast  Result Date: 04/16/2023 CLINICAL DATA:  Sepsis EXAM: CT ANGIOGRAPHY CHEST WITH CONTRAST TECHNIQUE: Multidetector CT imaging of the chest was performed using the standard protocol during bolus administration of intravenous contrast. Multiplanar CT image reconstructions and MIPs were obtained to evaluate the vascular anatomy. RADIATION DOSE REDUCTION: This exam was performed according to the departmental dose-optimization program which  includes automated exposure control, adjustment of the mA and/or kV according to patient size and/or use of iterative reconstruction technique. CONTRAST:  60mL OMNIPAQUE IOHEXOL 350 MG/ML SOLN COMPARISON:  Chest radiograph dated 04/15/2023 FINDINGS: Cardiovascular: Satisfactory opacification of the bilateral pulmonary arteries to the segmental level. No evidence pulmonary embolism. Although not tailored for evaluation of the thoracic aorta, there is no evidence of thoracic aortic aneurysm or dissection. Atherosclerotic calcifications of the aortic arch. Mild cardiomegaly.  No pericardial effusion. Mild coronary atherosclerosis the LAD and right coronary artery. Mediastinum/Nodes: No suspicious mediastinal lymphadenopathy. Visualized thyroid is unremarkable. Lungs/Pleura: Small bilateral pleural effusions. Associated bilateral lower lobe atelectasis. Faint ground-glass opacity in the bilateral upper lobes, nonspecific. Biapical pleural-parenchymal scarring. No pneumothorax. Upper Abdomen: Visualized upper abdomen is grossly unremarkable, noting vascular calcifications. Musculoskeletal: Degenerative changes of the visualized thoracolumbar spine. Review of the MIP images confirms the above findings. IMPRESSION: No evidence of pulmonary embolism. Small bilateral pleural effusions. Associated bilateral lower lobe atelectasis. Aortic Atherosclerosis (ICD10-I70.0). Electronically Signed   By: Charline Bills M.D.   On: 04/16/2023 00:07   DG Chest Port 1 View  Result Date: 04/15/2023 CLINICAL DATA:  Sepsis EXAM: PORTABLE CHEST 1 VIEW COMPARISON:  Film from earlier in the same day. FINDINGS: Cardiac shadow is enlarged but stable. Lungs are well aerated bilaterally. No focal infiltrate or effusion is seen. No bony abnormality is noted. IMPRESSION: No active disease. Electronically Signed   By: Alcide Clever M.D.   On: 04/15/2023 21:20   CT ABDOMEN PELVIS WO CONTRAST  Result Date: 04/15/2023 CLINICAL DATA:   Generalized weakness and fever EXAM: CT ABDOMEN AND PELVIS WITHOUT CONTRAST TECHNIQUE: Multidetector CT imaging of the abdomen and pelvis was performed following the standard protocol without IV contrast. RADIATION DOSE REDUCTION: This exam was performed according to the departmental dose-optimization program which includes automated exposure control, adjustment of the mA and/or kV according to patient size and/or use of iterative reconstruction technique. COMPARISON:  CT abdomen and pelvis dated 08/10/2013 FINDINGS: Lower chest: No focal consolidation or pulmonary nodule in the lung bases. Trace right pleural effusion. Partially imaged heart size is normal. Coronary artery calcifications. Hepatobiliary: No focal hepatic lesions. No intra or extrahepatic biliary ductal dilation. Normal gallbladder. Pancreas: Atrophic pancreas. Multifocal hypodensities in the region of the pancreatic head are again seen measuring simple fluid attenuation up to 1.9 cm (2:34), not substantially changed in size dating back to 08/10/2013. Spleen: Normal in size without focal abnormality. Adrenals/Urinary Tract: No adrenal nodules. Under rotated right kidney. No hydronephrosis or calculi. Mild mural thickening  of the right renal pelvis and mild right perinephric stranding. No focal bladder wall thickening. Stomach/Bowel: Small hiatal hernia. Normal appearance of the stomach. No evidence of bowel wall thickening, distention, or inflammatory changes. Colonic diverticulosis without acute diverticulitis. Appendix is not discretely seen. Vascular/Lymphatic: Aortic atherosclerosis. No enlarged abdominal or pelvic lymph nodes. Reproductive: No adnexal masses. Other: No free fluid, fluid collection, or free air. Musculoskeletal: No acute or abnormal lytic or blastic osseous lesions. Multilevel degenerative changes of the partially imaged thoracic and lumbar spine. Small fat-containing left inguinal hernia. IMPRESSION: 1. Mild mural thickening of  the right renal pelvis and mild right perinephric stranding, which may be seen in the setting of pyelonephritis. 2. Multifocal hypodensities in the region of the pancreatic head are again seen measuring simple fluid attenuation up to 1.9 cm, demonstrating near 10 year stability. Optional follow-up imaging in 2 years can be considered with contrast-enhanced MRI/MRCP if the patient is a surgical candidate. 3. Trace right pleural effusion. 4. Aortic Atherosclerosis (ICD10-I70.0). Coronary artery calcifications. Assessment for potential risk factor modification, dietary therapy or pharmacologic therapy may be warranted, if clinically indicated. Electronically Signed   By: Agustin Cree M.D.   On: 04/15/2023 09:09   DG Chest Portable 1 View  Result Date: 04/15/2023 CLINICAL DATA:  Multiple falls and weakness. Evaluation for pneumonia. EXAM: PORTABLE CHEST 1 VIEW COMPARISON:  Radiograph 12/21/2016 FINDINGS: Stable cardiomegaly. Aortic atherosclerotic calcification. Chronic bronchitic changes. No focal consolidation. Trace bilateral pleural effusions or pleural thickening. No pneumothorax. No displaced rib fractures. IMPRESSION: Chronic bronchitic changes. Trace bilateral pleural effusions or pleural thickening. Electronically Signed   By: Minerva Fester M.D.   On: 04/15/2023 02:26    Microbiology: Results for orders placed or performed during the hospital encounter of 04/15/23  Resp panel by RT-PCR (RSV, Flu A&B, Covid) Anterior Nasal Swab     Status: None   Collection Time: 04/15/23  2:17 AM   Specimen: Anterior Nasal Swab  Result Value Ref Range Status   SARS Coronavirus 2 by RT PCR NEGATIVE NEGATIVE Final    Comment: (NOTE) SARS-CoV-2 target nucleic acids are NOT DETECTED.  The SARS-CoV-2 RNA is generally detectable in upper respiratory specimens during the acute phase of infection. The lowest concentration of SARS-CoV-2 viral copies this assay can detect is 138 copies/mL. A negative result does not  preclude SARS-Cov-2 infection and should not be used as the sole basis for treatment or other patient management decisions. A negative result may occur with  improper specimen collection/handling, submission of specimen other than nasopharyngeal swab, presence of viral mutation(s) within the areas targeted by this assay, and inadequate number of viral copies(<138 copies/mL). A negative result must be combined with clinical observations, patient history, and epidemiological information. The expected result is Negative.  Fact Sheet for Patients:  BloggerCourse.com  Fact Sheet for Healthcare Providers:  SeriousBroker.it  This test is no t yet approved or cleared by the [place] FDA and  has been authorized for detection and/or diagnosis of SARS-CoV-2 by FDA under an Emergency Use Authorization (EUA). This EUA will remain  in effect (meaning this test can be used) for the duration of the COVID-19 declaration under Section 564(b)(1) of the Act, 21 U.S.C.section 360bbb-3(b)(1), unless the authorization is terminated  or revoked sooner.       Influenza A by PCR NEGATIVE NEGATIVE Final   Influenza B by PCR NEGATIVE NEGATIVE Final    Comment: (NOTE) The Xpert Xpress SARS-CoV-2/FLU/RSV plus assay is intended as an aid in the  diagnosis of influenza from Nasopharyngeal swab specimens and should not be used as a sole basis for treatment. Nasal washings and aspirates are unacceptable for Xpert Xpress SARS-CoV-2/FLU/RSV testing.  Fact Sheet for Patients: BloggerCourse.com  Fact Sheet for Healthcare Providers: SeriousBroker.it  This test is not yet approved or cleared by the [place] FDA and has been authorized for detection and/or diagnosis of SARS-CoV-2 by FDA under an Emergency Use Authorization (EUA). This EUA will remain in effect (meaning this test can be used) for the  duration of the COVID-19 declaration under Section 564(b)(1) of the Act, 21 U.S.C. section 360bbb-3(b)(1), unless the authorization is terminated or revoked.     Resp Syncytial Virus by PCR NEGATIVE NEGATIVE Final    Comment: (NOTE) Fact Sheet for Patients: BloggerCourse.com  Fact Sheet for Healthcare Providers: SeriousBroker.it  This test is not yet approved or cleared by the [place] FDA and has been authorized for detection and/or diagnosis of SARS-CoV-2 by FDA under an Emergency Use Authorization (EUA). This EUA will remain in effect (meaning this test can be used) for the duration of the COVID-19 declaration under Section 564(b)(1) of the Act, 21 U.S.C. section 360bbb-3(b)(1), unless the authorization is terminated or revoked.  Performed at [facility], [address] [place], [place] [address]   Urine Culture     Status: Abnormal   Collection Time: 04/15/23  4:05 AM   Specimen: Urine, Clean Catch  Result Value Ref Range Status   Specimen Description   Final    URINE, CLEAN CATCH Performed at [facility], [address] [place], [place] [address]    Special Requests   Final    NONE Performed at [facility], [address] [place], [place] [address]    Culture >=100,000 COLONIES/mL KLEBSIELLA PNEUMONIAE (A)  Final   Report Status 04/17/2023 FINAL  Final   Organism ID, Bacteria KLEBSIELLA PNEUMONIAE (A)  Final      Susceptibility   Klebsiella pneumoniae - MIC*    AMPICILLIN >=32 RESISTANT Resistant     CEFAZOLIN <=4 SENSITIVE Sensitive     CEFEPIME <=0.12 SENSITIVE Sensitive     CEFTRIAXONE <=0.25 SENSITIVE Sensitive     CIPROFLOXACIN <=0.25 SENSITIVE Sensitive     GENTAMICIN <=1 SENSITIVE Sensitive     IMIPENEM 0.5 SENSITIVE Sensitive     NITROFURANTOIN 64 INTERMEDIATE Intermediate     TRIMETH/SULFA <=20 SENSITIVE Sensitive     AMPICILLIN/SULBACTAM 4 SENSITIVE Sensitive      PIP/TAZO <=4 SENSITIVE Sensitive     * >=100,000 COLONIES/mL KLEBSIELLA PNEUMONIAE  Blood culture (routine x 2)     Status: None (Preliminary result)   Collection Time: 04/15/23  5:34 AM   Specimen: BLOOD LEFT ARM  Result Value Ref Range Status   Specimen Description BLOOD LEFT ARM  Final   Special Requests   Final    BOTTLES DRAWN AEROBIC AND ANAEROBIC Blood Culture adequate volume   Culture   Final    NO GROWTH 2 DAYS Performed at [facility], [address] [place], [place] [address]    Report Status PENDING  Incomplete  Blood culture (routine x 2)     Status: None (Preliminary result)   Collection Time: 04/15/23  5:41 AM   Specimen: BLOOD RIGHT ARM  Result Value Ref Range Status   Specimen Description BLOOD RIGHT ARM  Final   Special Requests   Final    BOTTLES DRAWN AEROBIC AND ANAEROBIC Blood Culture adequate volume   Culture  Final    NO GROWTH 2 DAYS Performed at [facility], [address] [place], [place] [address]    Report Status PENDING  Incomplete    Labs: CBC: Recent Labs  Lab 04/14/23 1909 04/16/23 0632 04/17/23 0258  WBC 19.8* 11.9* 15.5*  NEUTROABS  --   --  12.0*  HGB 9.7* 8.4* 8.8*  HCT 29.2* 26.1* 27.4*  MCV 82.0 83.7 83.0  PLT 291 264 321   Basic Metabolic Panel: Recent Labs  Lab 04/14/23 1909 04/15/23 1933 04/16/23 0632 04/17/23 0258  NA 132* 136 136 135  K 3.6 3.6 4.3 4.0  CL 99 103 107 108  CO2 21* 20* 20* 23  GLUCOSE 125* 120* 158* 129*  BUN 24* 23 20 23   CREATININE 1.52* 1.29* 1.13* 1.12*  CALCIUM 8.3* 7.8* 7.8* 8.0*  MG  --  1.9  --   --    Liver Function Tests: Recent Labs  Lab 04/14/23 1906 04/16/23 0632  AST 72* 41  ALT 8 6  ALKPHOS 128* 122  BILITOT 1.7* 0.6  PROT 6.8 5.8*  ALBUMIN 2.9* 2.4*   CBG: Recent Labs  Lab 04/15/23 1835  GLUCAP 98    Discharge time spent: greater than 30 minutes.  Signed: Jonah Blue, MD [facility] 04/17/2023

## 2023-04-17 NOTE — Evaluation (Signed)
Occupational Therapy Evaluation Patient Details Name: Dawn Dickerson MRN: [id number] DOB: 29-Jun-1932 Today's Date: 04/17/2023   History of Present Illness Pt is a 89 y.o. female with medical history significant of hypertension, hyperlipidemia, pulmonary hypertension, hypothyroidism presenting with sepsis, UTI, bronchitis.  Patient reports generalized weakness and malaise over the past 24 hours.  Mild chills.  Right-sided abdominal pain versus flank pain.  No frank dysuria or increased urinary frequency.  Positive foul-smelling urine over the past 24 hours.  Positive cough.  No reported chest pain nausea or vomiting.  Non-smoker.  No reported alcohol use.   Clinical Impression   Dawn Dickerson was seen for OT evaluation this date. Prior to hospital admission, pt was generally independent or MOD I for ADL management. Pt lives alone in a 1 level home with ~4 STE. She endorses using a 4WW for community mobility and primarily furniture surfs in the home when performing HH mobility. 1 fall in the last 6 months. Pt presents to acute OT demonstrating impaired ADL performance and functional mobility 2/2 generalized weakness & decreased activity tolerance (See OT problem list for additional functional deficits). Pt currently requires SUPERVISION for alls functional transfers and ADL management. See ADL section below for additional details.  Pt would benefit from skilled OT services to address noted impairments and functional limitations (see below for any additional details) in order to maximize safety and independence while minimizing falls risk and caregiver burden. Do not anticipate the need for follow up OT services upon acute hospital DC.        If plan is discharge home, recommend the following: Assistance with cooking/housework;Help with stairs or ramp for entrance;Assist for transportation    Functional Status Assessment  Patient has had a recent decline in their functional status and demonstrates the  ability to make significant improvements in function in a reasonable and predictable amount of time.  Equipment Recommendations  None recommended by OT    Recommendations for Other Services       Precautions / Restrictions Precautions Precautions: Fall Restrictions Weight Bearing Restrictions: No      Mobility Bed Mobility Overal bed mobility: Needs Assistance             General bed mobility comments: Per RN, pt able to get OOB with supervision this am just prior to OT's arrival.    Transfers Overall transfer level: Needs assistance   Transfers: Sit to/from Stand Sit to Stand: Supervision           General transfer comment: Able to perform STS with no cuing close sup for safety using a RW and assistance with line management      Balance Overall balance assessment: No apparent balance deficits (not formally assessed)                                         ADL either performed or assessed with clinical judgement   ADL Overall ADL's : Needs assistance/impaired                                       General ADL Comments: Pt functionally limited by generalized weakness and decreased activity tolerance. She requires SUPERVISION for safety with bed mobility, functional transfers, toileting, and standing grooming tasks this date. Pt reports feeling "very weak" and not at her baseline  level of independence.     Vision Baseline Vision/History: 6 Macular Degeneration Ability to See in Adequate Light: 3 Highly impaired Patient Visual Report: No change from baseline       Perception         Praxis         Pertinent Vitals/Pain Pain Assessment Pain Assessment: No/denies pain     Extremity/Trunk Assessment Upper Extremity Assessment Upper Extremity Assessment: Overall WFL for tasks assessed   Lower Extremity Assessment Lower Extremity Assessment: Generalized weakness;Defer to PT evaluation       Communication  Communication Communication: No apparent difficulties Cueing Techniques: Verbal cues   Cognition Arousal: Alert Behavior During Therapy: WFL for tasks assessed/performed Overall Cognitive Status: Within Functional Limits for tasks assessed                                 General Comments: AO x4; Pleasant and willing to work with OT     General Comments  Able to demonstrate good balance with min sways during static stance during functional activity    Exercises Other Exercises Other Exercises: Pt educated on role of OT in acute setting, safety, falls prevention strategies, DC recs, and routines modifications to support safety and functional independence during ADL management upon DC.   Shoulder Instructions      Home Living Family/patient expects to be discharged to:: Private residence Living Arrangements: Alone Available Help at Discharge: Family;Available PRN/intermittently (Son lives in [place]) Type of Home: House Home Access: Stairs to enter Secretary/administrator of Steps: 4-5 Entrance Stairs-Rails: Right (at [employer]) Home Layout: One level (small threshold to go in/out of bathroom)     Bathroom Shower/Tub: Walk-in shower;Tub only         Home Equipment: Shower seat;Hand held Programmer, systems (2 wheels);Cane - single point;Rollator (4 wheels)          Prior Functioning/Environment Prior Level of Function : History of Falls (last six months);Independent/Modified Independent             Mobility Comments: 1 fall in last six months ~ 1 week PTA, uses rollator when she goes for a walk, otherwise uses furniture/doors inside the home. ADLs Comments: Son assissts with driving, she has MOW for daily meals. Otherwise, independent with IADL management.        OT Problem List: Decreased strength;Decreased coordination;Decreased activity tolerance;Decreased safety awareness      OT Treatment/Interventions: Self-care/ADL training;Therapeutic  exercise;Therapeutic activities;Patient/family education;DME and/or AE instruction;Energy conservation    OT Goals(Current goals can be found in the care plan section) Acute Rehab OT Goals Patient Stated Goal: To be able to walk around the block again. OT Goal Formulation: With patient Time For Goal Achievement: 05/01/23 Potential to Achieve Goals: Good ADL Goals Pt Will Perform Grooming: standing;with modified independence Pt Will Perform Upper Body Dressing: sitting;with modified independence Pt Will Perform Lower Body Dressing: sit to/from stand;with modified independence;with adaptive equipment  OT Frequency: Min 1X/week    Co-evaluation              AM-PAC OT "6 Clicks" Daily Activity     Outcome Measure Help from another person eating meals?: None Help from another person taking care of personal grooming?: A Little Help from another person toileting, which includes using toliet, bedpan, or urinal?: A Little Help from another person bathing (including washing, rinsing, drying)?: A Little Help from another person to put on and taking off regular  upper body clothing?: None Help from another person to put on and taking off regular lower body clothing?: A Little 6 Click Score: 20   End of Session Equipment Utilized During Treatment: Gait belt;Rolling walker (2 wheels) Nurse Communication: Mobility status  Activity Tolerance: Patient tolerated treatment well Patient left: in chair;with call bell/phone within reach;with chair alarm set  OT Visit Diagnosis: Other abnormalities of gait and mobility (R26.89);Muscle weakness (generalized) (M62.81)                Time: 1610-9604 OT Time Calculation (min): 25 min Charges:  OT General Charges $OT Visit: 1 Visit OT Evaluation $OT Eval Low Complexity: 1 Low OT Treatments $Self Care/Home Management : 8-22 mins  Rockney Ghee, M.S., OTR/L 04/17/23, 11:22 AM

## 2023-04-17 NOTE — TOC Transition Note (Signed)
Transition of Care [facility]) - CM/SW Discharge Note   Patient Details  Name: TAHIA MACNEAL MRN: [id number] Date of Birth: 06-Oct-1931  Transition of Care [facility]) CM/SW Contact:  Darolyn Rua, LCSW Phone Number: 04/17/2023, 12:46 PM   Clinical Narrative:     Patient to discharge home today with [facility] PT and OT, per son no preference of HH agency, [facility] accepted.   No additional discharge needs or dme, son to pick up today for discharge.     Final next level of care: Home w Home Health Services Barriers to Discharge: No Barriers Identified   Patient Goals and CMS Choice CMS Medicare.gov Compare Post Acute Care list provided to:: Patient Choice offered to / list presented to : Patient  Discharge Placement                         Discharge Plan and Services Additional resources added to the After Visit Summary for                              [facility] Agency: [facility] Date [facility] Agency Contacted: 04/17/23 Time HH Agency Contacted: [phone] Representative spoke with at [facility] Agency: Barbara Cower  Social Determinants of Health (SDOH) Interventions SDOH Screenings   Food Insecurity: No Food Insecurity (04/15/2023)  Housing: Low Risk  (04/15/2023)  Transportation Needs: No Transportation Needs (04/15/2023)  Utilities: Not At Risk (04/15/2023)  Tobacco Use: Low Risk  (04/14/2023)     Readmission Risk Interventions     No data to display

## 2023-04-17 NOTE — Evaluation (Signed)
Physical Therapy Evaluation Patient Details Name: Dawn Dickerson MRN: [id number] DOB: [birth year] Today's Date: 04/17/2023  History of Present Illness  Pt is a 89 y.o. female with medical history significant of hypertension, hyperlipidemia, pulmonary hypertension, hypothyroidism presenting with sepsis, UTI, bronchitis.  Patient reports generalized weakness and malaise over the past 24 hours.  Mild chills.  Right-sided abdominal pain versus flank pain.  No frank dysuria or increased urinary frequency.  Positive foul-smelling urine over the past 24 hours.  Positive cough.  No reported chest pain nausea or vomiting.  Non-smoker.  No reported alcohol use.  Clinical Impression   Pt is received in recliner, she is agreeable to PT session. At baseline Pt reports furniture walking inside home and use of rollator during weekly neighborhood walks. Pt performs mod I for bed mobility, transfers sup A, amb and stair negotiation CGA for safety. Pt able to amb approx 210 ft using RW with min cuing for AD management and slight gait impairments-Pt took approx 5 steps 1 HHA towards recliner at end of amb activity with mild notable unsteadiness. Additionally, Pt able to negotiate 2 steps with use of BUE support CGA. At end of session, Pt reports increased fatigue with SpO2 of 96%. Overall, Pt demonstrate good participation throughout session but has decreased endurance. Pt would benefit from skilled PT to address above deficits and promote optimal return to PLOF.      If plan is discharge home, recommend the following: A little help with walking and/or transfers;A little help with bathing/dressing/bathroom;Help with stairs or ramp for entrance;Assist for transportation   Can travel by private vehicle        Equipment Recommendations None recommended by PT  Recommendations for Other Services       Functional Status Assessment Patient has had a recent decline in their functional status and demonstrates the ability  to make significant improvements in function in a reasonable and predictable amount of time.     Precautions / Restrictions Precautions Precautions: Fall Restrictions Weight Bearing Restrictions: No      Mobility  Bed Mobility Overal bed mobility: Modified Independent             General bed mobility comments: Unable to formally assess bed mobility but Pt reports using hand railings to get out of bed    Transfers Overall transfer level: Needs assistance Equipment used: Rolling walker (2 wheels) Transfers: Sit to/from Stand Sit to Stand: Supervision           General transfer comment: Able to perform STS with no cuing close sup for safety and assistance with line management    Ambulation/Gait Ambulation/Gait assistance: Contact guard assist Gait Distance (Feet): 210 Feet Assistive device: Rolling walker (2 wheels) Gait Pattern/deviations: WFL(Within Functional Limits), Decreased stride length Gait velocity: slightly decreased     General Gait Details: min cuing for AD management  Stairs Stairs: Yes Stairs assistance: Contact guard assist Stair Management: Two rails Number of Stairs: 2 General stair comments: able to negotiate stairs with reciprocal step pattern  Wheelchair Mobility     Tilt Bed    Modified Rankin (Stroke Patients Only)       Balance Overall balance assessment: Modified Independent                                           Pertinent Vitals/Pain Pain Assessment Pain Assessment: No/denies pain  Home Living Family/patient expects to be discharged to:: Private residence Living Arrangements: Alone Available Help at Discharge: Family;Available PRN/intermittently (son and daughter in law live in [place] and check in regularly) Type of Home: House Home Access: Stairs to enter Entrance Stairs-Rails: Right (at [employer]) Secretary/administrator of Steps: 4-5   Home Layout: One level Home Equipment: Shower seat;Hand  held Programmer, systems (2 wheels);Cane - single point;Rollator (4 wheels)      Prior Function Prior Level of Function : History of Falls (last six months);Independent/Modified Independent             Mobility Comments: Amb mod I at baseline with use of furniture and rollator for community amb; 1 fall in the last 6 months ADLs Comments: Son assissts with driving, she has MOW for daily meals. Otherwise, independent with IADL management.     Extremity/Trunk Assessment   Upper Extremity Assessment Upper Extremity Assessment: Defer to OT evaluation;Overall WFL for tasks assessed    Lower Extremity Assessment Lower Extremity Assessment: Generalized weakness       Communication   Communication Communication: No apparent difficulties Cueing Techniques: Verbal cues  Cognition Arousal: Alert Behavior During Therapy: WFL for tasks assessed/performed Overall Cognitive Status: Within Functional Limits for tasks assessed                                 General Comments: AO x4; Pleasant and willing to work with PT        General Comments General comments (skin integrity, edema, etc.): Able to demonstrate good balance with min sways during static stance during functional activity    Exercises     Assessment/Plan    PT Assessment Patient needs continued PT services  PT Problem List Decreased strength;Decreased mobility;Decreased activity tolerance;Decreased balance       PT Treatment Interventions Gait training;Stair training;Functional mobility training;Therapeutic activities;DME instruction;Therapeutic exercise    PT Goals (Current goals can be found in the Care Plan section)  Acute Rehab PT Goals Patient Stated Goal: to get better PT Goal Formulation: With patient Time For Goal Achievement: 05/01/23 Potential to Achieve Goals: Good    Frequency Min 1X/week     Co-evaluation               AM-PAC PT "6 Clicks" Mobility  Outcome Measure  Help needed turning from your back to your side while in a flat bed without using bedrails?: A Little Help needed moving from lying on your back to sitting on the side of a flat bed without using bedrails?: A Little Help needed moving to and from a bed to a chair (including a wheelchair)?: A Little Help needed standing up from a chair using your arms (e.g., wheelchair or bedside chair)?: A Little Help needed to walk in hospital room?: A Little Help needed climbing 3-5 steps with a railing? : A Little 6 Click Score: 18    End of Session Equipment Utilized During Treatment: Gait belt Activity Tolerance: Patient tolerated treatment well Patient left: in chair;with call bell/phone within reach;with chair alarm set;with family/visitor present Nurse Communication: Mobility status PT Visit Diagnosis: Unsteadiness on feet (R26.81);History of falling (Z91.81);Muscle weakness (generalized) (M62.81)    Time: 7829-5621 PT Time Calculation (min) (ACUTE ONLY): 15 min   Charges:                 Elmon Else, SPT   Maty Zeisler 04/17/2023, 10:38 AM

## 2023-04-20 LAB — CULTURE, BLOOD (ROUTINE X 2)
Culture: NO GROWTH
Culture: NO GROWTH
Special Requests: ADEQUATE
Special Requests: ADEQUATE

## 2023-04-20 LAB — BLOOD GAS, VENOUS
Acid-base deficit: 4.5 mmol/L — ABNORMAL HIGH (ref 0.0–2.0)
Bicarbonate: 21.1 mmol/L (ref 20.0–28.0)
O2 Saturation: 24.2 %
Patient temperature: 37
pCO2, Ven: 40 mmHg — ABNORMAL LOW (ref 44–60)
pH, Ven: 7.33 (ref 7.25–7.43)

## 2023-04-24 ENCOUNTER — Other Ambulatory Visit: Payer: Self-pay | Admitting: Dermatology

## 2023-04-24 ENCOUNTER — Telehealth: Payer: Self-pay

## 2023-04-24 NOTE — Telephone Encounter (Signed)
Patient advised pathology came back margins free s/p excision. Will recheck site 04/28/23 per patient request. Butch Penny., RMA

## 2023-04-24 NOTE — Telephone Encounter (Signed)
-----  Message from [place] sent at 04/14/2023  9:27 PM EDT ----- Diagnosis Skin (M), right anterior thigh :       EXCISION, RESIDUAL SQUAMOUS CELL CARCINOMA, MARGINS FREE    Please call to share that excision was clear of skin cancer and get update on surgical wound. Thank you.

## 2023-04-28 ENCOUNTER — Ambulatory Visit (INDEPENDENT_AMBULATORY_CARE_PROVIDER_SITE_OTHER): Payer: Medicare Other | Admitting: Dermatology

## 2023-04-28 ENCOUNTER — Encounter: Payer: Self-pay | Admitting: Dermatology

## 2023-04-28 VITALS — BP 126/61 | HR 61

## 2023-04-28 DIAGNOSIS — Z4802 Encounter for removal of sutures: Secondary | ICD-10-CM

## 2023-04-28 NOTE — Patient Instructions (Signed)

## 2023-04-28 NOTE — Progress Notes (Signed)
   Follow-Up Visit   Subjective  Dawn Dickerson is a 89 y.o. female who presents for the following: Suture removal at right anterior thigh. Patient was hospitalized at time suture removal was scheduled. States a nurse at the hospital removed the sutures. Here to make sure all sutures were removed.   Pathology showed: residual SCC, margins free.  Patient states her psoriasis improved over the summer while she was sitting out in the sun. Would like to have a "sun lamp" for winter months.   The following portions of the chart were reviewed this encounter and updated as appropriate: medications, allergies, medical history  Review of Systems:  No other skin or systemic complaints except as noted in HPI or Assessment and Plan.  Objective  Well appearing patient in no apparent distress; mood and affect are within normal limits.  Areas Examined: Right thigh  Relevant physical exam findings are noted in the Assessment and Plan.    Assessment & Plan    Encounter for Removal of Sutures - Incision site is clean, dry and intact. - Spitting suture removed from distal edge of scar. - Discussed pathology results showing residual SCC, margins free - Scars remodel for a full year. - patient can apply over-the-counter silicone scar cream once to twice a day to help with scar remodeling if desired. - Patient advised to call with any concerns or if they notice any new or changing lesions.  Return for Psoriasis Follow Up As Scheduled , With Dr. Roseanne Reno.  I, Lawson Radar, CMA, am acting as scribe for Elie Goody, MD.   Documentation: I have reviewed the above documentation for accuracy and completeness, and I agree with the above.  Elie Goody, MD

## 2023-04-29 ENCOUNTER — Telehealth: Payer: Self-pay

## 2023-04-29 NOTE — Telephone Encounter (Signed)
Called patient. Advised Dr. Roseanne Reno wants to see her at the 11/4 appointment. She will assess the amount of actinic damage vs psoriasis and see if NBUVB is still an option for her. Patient voiced understanding.

## 2023-04-29 NOTE — Telephone Encounter (Signed)
-----   Message from [place] sent at 04/29/2023  4:11 PM EDT ----- Regarding: FW: Phototherapy for psoriasis Hi team, please call patient to tell her that Dr Roseanne Reno wants to see her at the November 4th appointment before considering a light box for psoriasis. Thank you ----- Message ----- From: Willeen Niece, MD Sent: 04/28/2023   3:11 PM EDT To: Elie Goody, MD Subject: RE: Phototherapy for psoriasis                 I would likely just have her do it here in office with NBUVB twice weekly.  But now I wonder if it is a good idea with her recent h/o SCC on the leg.  I think I would like to wait to see her in November and assess the amount of actinic damage vs psoriasis on her legs and see if NBUVB is still an option for her. ----- Message ----- From: Elie Goody, MD Sent: 04/28/2023   2:12 PM EDT To: Willeen Niece, MD Subject: Phototherapy for psoriasis                     Hi Dr Roseanne Reno,  I saw your patient for a lesion of concern (SCC) and excised it. She mentioned wanting to start phototherapy for psoriasis on the legs during the wound check today. I see in your 11/25/22 note that you were considering adding it. The patient has follow up with you for psoriasis on 06/02/23. Do you want to order a light box for home now or should the patient wait until that appointment? Does Marchelle Folks help with ordering?  Thank you! [employer]

## 2023-06-02 ENCOUNTER — Ambulatory Visit (INDEPENDENT_AMBULATORY_CARE_PROVIDER_SITE_OTHER): Payer: Medicare Other | Admitting: Dermatology

## 2023-06-02 DIAGNOSIS — L570 Actinic keratosis: Secondary | ICD-10-CM

## 2023-06-02 DIAGNOSIS — L814 Other melanin hyperpigmentation: Secondary | ICD-10-CM

## 2023-06-02 DIAGNOSIS — T85622A Displacement of permanent sutures, initial encounter: Secondary | ICD-10-CM | POA: Diagnosis not present

## 2023-06-02 DIAGNOSIS — W908XXA Exposure to other nonionizing radiation, initial encounter: Secondary | ICD-10-CM | POA: Diagnosis not present

## 2023-06-02 DIAGNOSIS — L821 Other seborrheic keratosis: Secondary | ICD-10-CM | POA: Diagnosis not present

## 2023-06-02 DIAGNOSIS — L409 Psoriasis, unspecified: Secondary | ICD-10-CM

## 2023-06-02 DIAGNOSIS — Z85828 Personal history of other malignant neoplasm of skin: Secondary | ICD-10-CM

## 2023-06-02 DIAGNOSIS — L689 Hypertrichosis, unspecified: Secondary | ICD-10-CM | POA: Diagnosis not present

## 2023-06-02 DIAGNOSIS — D1801 Hemangioma of skin and subcutaneous tissue: Secondary | ICD-10-CM

## 2023-06-02 NOTE — Patient Instructions (Addendum)
Cryotherapy Aftercare  Wash gently with soap and water everyday.   Apply Vaseline and Band-Aid daily until healed.   For Psoriasis on arms and legs Continue Clobetasol cream 1 to 2 times a day as needed for flares, avoid face, groin, axilla Continue Calcipotriene cream 1  to 2 times a day as needed for flares   Due to recent changes in healthcare laws, you may see results of your pathology and/or laboratory studies on MyChart before the doctors have had a chance to review them. We understand that in some cases there may be results that are confusing or concerning to you. Please understand that not all results are received at the same time and often the doctors may need to interpret multiple results in order to provide you with the best plan of care or course of treatment. Therefore, we ask that you please give [place] 2 business days to thoroughly review all your results before contacting the office for clarification. Should we see a critical lab result, you will be contacted sooner.   If You Need Anything After Your Visit  If you have any questions or concerns for your doctor, please call our main line at [phone] and press option 4 to reach your doctor's medical assistant. If no one answers, please leave a voicemail as directed and we will return your call as soon as possible. Messages left after 4 pm will be answered the following business day.   You may also send [place] a message via MyChart. We typically respond to MyChart messages within 1-2 business days.  For prescription refills, please ask your pharmacy to contact our office. Our fax number is [phone].  If you have an urgent issue when the clinic is closed that cannot wait until the next business day, you can page your doctor at the number below.    Please note that while we do our best to be available for urgent issues outside of office hours, we are not available 24/7.   If you have an urgent issue and are unable to reach [place], you  may choose to seek medical care at your doctor's office, retail clinic, urgent care center, or emergency room.  If you have a medical emergency, please immediately call 911 or go to the emergency department.  Pager Numbers  - Dr. Gwen Pounds: [phone]  - Dr. Roseanne Reno: [phone]  - Dr. Katrinka Blazing: [phone]   In the event of inclement weather, please call our main line at [phone] for an update on the status of any delays or closures.  Dermatology Medication Tips: Please keep the boxes that topical medications come in in order to help keep track of the instructions about where and how to use these. Pharmacies typically print the medication instructions only on the boxes and not directly on the medication tubes.   If your medication is too expensive, please contact our office at [phone] option 4 or send [place] a message through MyChart.   We are unable to tell what your co-pay for medications will be in advance as this is different depending on your insurance coverage. However, we may be able to find a substitute medication at lower cost or fill out paperwork to get insurance to cover a needed medication.   If a prior authorization is required to get your medication covered by your insurance company, please allow [place] 1-2 business days to complete this process.  Drug prices often vary depending on where the prescription is filled and some pharmacies may offer cheaper prices.  The website www.goodrx.com contains coupons for medications through different pharmacies. The prices here do not account for what the cost may be with help from insurance (it may be cheaper with your insurance), but the website can give you the price if you did not use any insurance.  - You can print the associated coupon and take it with your prescription to the pharmacy.  - You may also stop by our office during regular business hours and pick up a GoodRx coupon card.  - If you need your prescription sent  electronically to a different pharmacy, notify our office through [facility] or by phone at [phone] option 4.     Si Usted Necesita Algo Despus de Su Visita  Tambin puede enviarnos un mensaje a travs de Clinical cytogeneticist. Por lo general respondemos a los mensajes de MyChart en el transcurso de 1 a 2 das hbiles.  Para renovar recetas, por favor pida a su farmacia que se ponga en contacto con nuestra oficina. Annie Sable de fax es [place] [phone].  Si tiene un asunto urgente cuando la clnica est cerrada y que no puede esperar hasta el siguiente da hbil, puede llamar/localizar a su doctor(a) al nmero que aparece a continuacin.   Por favor, tenga en cuenta que aunque hacemos todo lo posible para estar disponibles para asuntos urgentes fuera del horario de [place], no estamos disponibles las 24 horas del da, los 7 [address]  [address] de la [place].   Si tiene un problema urgente y no puede comunicarse con nosotros, puede optar por buscar atencin mdica  en el consultorio de su doctor(a), en una clnica privada, en un centro de atencin urgente o en una sala de emergencias.  Si tiene Engineer, drilling, por favor llame inmediatamente al 911 o vaya a la sala de emergencias.  Nmeros de bper  - Dr. Gwen Pounds: [phone]  - Dra. Roseanne Reno: [phone]  - Dr. Katrinka Blazing: [phone]   En caso de inclemencias del tiempo, por favor llame a Lacy Duverney principal al [phone] para una actualizacin sobre el [place] de cualquier retraso o cierre.  Consejos para la medicacin en dermatologa: Por favor, guarde las cajas en las que vienen los medicamentos de uso tpico para ayudarle a seguir las instrucciones sobre dnde y cmo usarlos. Las farmacias generalmente imprimen las instrucciones del medicamento slo en las cajas y no directamente en los tubos del [place].   Si su medicamento es muy caro, por favor, pngase en contacto con Rolm Gala llamando al [phone] y presione la  opcin 4 o envenos un mensaje a travs de Clinical cytogeneticist.   No podemos decirle cul ser su copago por los medicamentos por adelantado ya que esto es diferente dependiendo de la cobertura de su seguro. Sin embargo, es posible que podamos encontrar un medicamento sustituto a Audiological scientist un formulario para que el seguro cubra el medicamento que se considera necesario.   Si se requiere una autorizacin previa para que su compaa de seguros [place] su medicamento, por favor permtanos de 1 a 2 das hbiles para completar [address]  Los precios de los medicamentos varan con frecuencia dependiendo del Environmental consultant de dnde se surte la receta y alguna farmacias pueden ofrecer precios ms baratos.  El sitio web www.goodrx.com tiene cupones para medicamentos de Health and safety inspector. Los precios aqu no tienen en cuenta lo que podra costar con la ayuda del seguro (puede ser ms barato con su seguro), pero el sitio web puede darle el precio si no utiliz Tourist information centre manager.  -  Puede imprimir el cupn correspondiente y llevarlo con su receta a la farmacia.  - Tambin puede pasar por nuestra oficina durante el horario de atencin regular y Education officer, museum una tarjeta de cupones de GoodRx.  - Si necesita que su receta se enve electrnicamente a una farmacia diferente, informe a nuestra oficina a travs de MyChart de [place] o por telfono llamando al [phone] y presione la opcin 4.

## 2023-06-02 NOTE — Progress Notes (Signed)
Follow-Up Visit   Subjective  Dawn Dickerson is a 89 y.o. female who presents for the following: Psoriasis arms, legs 67m f/u, Not having to use Clobetasol cr or Calcipotriene cr, pt has not had any flares since last visit, she has been sitting outside in the sun and it has cleared up her psoriasis. Recent hx of SCC R ant thigh, pt feels like she may have spitting sutures The patient has spots, moles and lesions to be evaluated, some may be new or changing and the patient may have concern these could be cancer.  Patient accompanied by son who contributes to history.  The following portions of the chart were reviewed this encounter and updated as appropriate: medications, allergies, medical history  Review of Systems:  No other skin or systemic complaints except as noted in HPI or Assessment and Plan.  Objective  Well appearing patient in no apparent distress; mood and affect are within normal limits.   A focused examination was performed of the following areas: Arms, legs, face, back  Relevant exam findings are noted in the Assessment and Plan.  L hand dorsum x 1, R hand dorsum x 1 (2) Hyperkeratotic scaly papules  L nasal dorsum x 1 Pink scaly macule    Assessment & Plan   Hypertrophic actinic keratosis (2) L hand dorsum x 1, R hand dorsum x 1  Destruction of lesion - L hand dorsum x 1, R hand dorsum x 1 (2)  Destruction method: cryotherapy   Informed consent: discussed and consent obtained   Lesion destroyed using liquid nitrogen: Yes   Region frozen until ice ball extended beyond lesion: Yes   Outcome: patient tolerated procedure well with no complications   Post-procedure details: wound care instructions given   Additional details:  Prior to procedure, discussed risks of blister formation, small wound, skin dyspigmentation, or rare scar following cryotherapy. Recommend Vaseline ointment to treated areas while healing.   AK (actinic keratosis) L nasal dorsum x  1  Actinic keratoses are precancerous spots that appear secondary to cumulative UV radiation exposure/sun exposure over time. They are chronic with expected duration over 1 year. A portion of actinic keratoses will progress to squamous cell carcinoma of the skin. It is not possible to reliably predict which spots will progress to skin cancer and so treatment is recommended to prevent development of skin cancer.  Recommend daily broad spectrum sunscreen SPF 30+ to sun-exposed areas, reapply every 2 hours as needed.  Recommend staying in the shade or wearing long sleeves, sun glasses (UVA+UVB protection) and wide brim hats (4-inch brim around the entire circumference of the hat). Call for new or changing lesions.  Destruction of lesion - L nasal dorsum x 1  Destruction method: cryotherapy   Informed consent: discussed and consent obtained   Lesion destroyed using liquid nitrogen: Yes   Region frozen until ice ball extended beyond lesion: Yes   Outcome: patient tolerated procedure well with no complications   Post-procedure details: wound care instructions given   Additional details:  Prior to procedure, discussed risks of blister formation, small wound, skin dyspigmentation, or rare scar following cryotherapy. Recommend Vaseline ointment to treated areas while healing.   PSORIASIS Arms, legs Exam: light pink scaly macules few scattered on thighs <1% BSA.  Chronic and persistent condition with duration or expected duration over one year. Condition is improving with treatment but not currently at goal.   patient denies joint pain  Psoriasis is a chronic non-curable, but treatable genetic/hereditary  disease that may have other systemic features affecting other organ systems such as joints (Psoriatic Arthritis). It is associated with an increased risk of inflammatory bowel disease, heart disease, non-alcoholic fatty liver disease, and depression.  Treatments include light and laser treatments;  topical medications; and systemic medications including oral and injectables.  Treatment Plan: Discussed increased risk of skin cancer with regular sun exposure, do not recommend home UVB light.  She needs to be cautious due to recent h/o SCC on thigh.  Cont Clobetasol cr qd to bid aa psoriasis on arms legs prn flares, avoid face, groin, axilla Cont Calcipotriene cr qd to bid aa psoriasis arms and legs prn flares  Topical steroids (such as triamcinolone, fluocinolone, fluocinonide, mometasone, clobetasol, halobetasol, betamethasone, hydrocortisone) can cause thinning and lightening of the skin if they are used for too long in the same area. Your physician has selected the right strength medicine for your problem and area affected on the body. Please use your medication only as directed by your physician to prevent side effects.    SEBORRHEIC KERATOSIS - Stuck-on, waxy, tan-brown papules and/or plaques - R flank - Benign-appearing - Discussed benign etiology and prognosis. - Observe - Call for any changes   HYPERTRICHOSIS Chin Exam: hypertrichosis L chin, white hairs  Treatment Plan: Discussed shaving hairs   SPITTING SUTURES R ant thigh secondary to SCC excision Exam: spitting PDS sutures  Treatment Plan: Sutures trimmed x 4, 1% lido/epi for local anesthesia  LENTIGINES Exam: scattered tan macules Due to sun exposure Treatment Plan: Benign-appearing, observe. Recommend daily broad spectrum sunscreen SPF 30+ to sun-exposed areas, reapply every 2 hours as needed.  Call for any changes  HEMANGIOMA Exam: red papule(s) Discussed benign nature. Recommend observation. Call for changes.   Return in about 6 months (around 11/30/2023) for Psoriasis f/u, Hx of SCC.  I, Ardis Rowan, RMA, am acting as scribe for Willeen Niece, MD .   Documentation: I have reviewed the above documentation for accuracy and completeness, and I agree with the above.  Willeen Niece, MD

## 2023-08-14 ENCOUNTER — Other Ambulatory Visit: Payer: Self-pay | Admitting: Internal Medicine

## 2023-08-14 DIAGNOSIS — N1831 Chronic kidney disease, stage 3a: Secondary | ICD-10-CM

## 2023-08-20 ENCOUNTER — Other Ambulatory Visit: Payer: Medicare Other

## 2023-08-26 ENCOUNTER — Ambulatory Visit
Admission: RE | Admit: 2023-08-26 | Discharge: 2023-08-26 | Disposition: A | Discharge status: Home / Self Care | Payer: Medicare Other | Source: Ambulatory Visit | Attending: Internal Medicine | Admitting: Internal Medicine

## 2023-08-26 DIAGNOSIS — N1831 Chronic kidney disease, stage 3a: Secondary | ICD-10-CM | POA: Insufficient documentation

## 2023-12-01 ENCOUNTER — Ambulatory Visit: Payer: Medicare Other | Admitting: Dermatology

## 2024-02-05 ENCOUNTER — Encounter: Payer: Self-pay | Admitting: Dermatology

## 2024-02-05 ENCOUNTER — Ambulatory Visit: Admitting: Dermatology

## 2024-02-05 DIAGNOSIS — L570 Actinic keratosis: Secondary | ICD-10-CM

## 2024-02-05 DIAGNOSIS — D492 Neoplasm of unspecified behavior of bone, soft tissue, and skin: Secondary | ICD-10-CM | POA: Diagnosis not present

## 2024-02-05 DIAGNOSIS — C44719 Basal cell carcinoma of skin of left lower limb, including hip: Secondary | ICD-10-CM | POA: Diagnosis not present

## 2024-02-05 DIAGNOSIS — D229 Melanocytic nevi, unspecified: Secondary | ICD-10-CM

## 2024-02-05 DIAGNOSIS — L821 Other seborrheic keratosis: Secondary | ICD-10-CM

## 2024-02-05 DIAGNOSIS — W908XXA Exposure to other nonionizing radiation, initial encounter: Secondary | ICD-10-CM

## 2024-02-05 DIAGNOSIS — L409 Psoriasis, unspecified: Secondary | ICD-10-CM

## 2024-02-05 DIAGNOSIS — D485 Neoplasm of uncertain behavior of skin: Secondary | ICD-10-CM

## 2024-02-05 DIAGNOSIS — C4491 Basal cell carcinoma of skin, unspecified: Secondary | ICD-10-CM

## 2024-02-05 DIAGNOSIS — Z85828 Personal history of other malignant neoplasm of skin: Secondary | ICD-10-CM

## 2024-02-05 DIAGNOSIS — D1801 Hemangioma of skin and subcutaneous tissue: Secondary | ICD-10-CM

## 2024-02-05 HISTORY — DX: Basal cell carcinoma of skin, unspecified: C44.91

## 2024-02-05 NOTE — Progress Notes (Signed)
 Follow-Up Visit   Subjective  Dawn Dickerson is a 89 y.o. female who presents for the following: 6 month psoriasis follow up and recheck SCC at right thigh. Patient is not using anything currently for psoriasis but has clobetasol  and calcipotriene  to use prn. She does have a pink spot at left upper arm she noticed yesterday.  Also spot on Left calf  Patient accompanied by son.   The following portions of the chart were reviewed this encounter and updated as appropriate: medications, allergies, medical history  Review of Systems:  No other skin or systemic complaints except as noted in HPI or Assessment and Plan.  Objective  Well appearing patient in no apparent distress; mood and affect are within normal limits.   A focused examination was performed of the following areas: Legs, arms, face, back and hands  Relevant exam findings are noted in the Assessment and Plan.  Nose x 3 (3) Erythematous thin papules/macules with gritty scale.  left mid calf 9 mm pink pearly papule   Assessment & Plan   PSORIASIS Exam: pink scaly papule at left upper elbow, legs clear, pink scaly macules at right forearm and left hand dorsum < 1% BSA.  Chronic condition with duration or expected duration over one year. Currently well-controlled.  Psoriasis is a chronic non-curable, but treatable genetic/hereditary disease that may have other systemic features affecting other organ systems such as joints (Psoriatic Arthritis). It is associated with an increased risk of inflammatory bowel disease, heart disease, non-alcoholic fatty liver disease, and depression.  Treatments include light and laser treatments; topical medications; and systemic medications including oral and injectables.  Treatment Plan: Cont Clobetasol  cr qd to bid aa psoriasis on arms legs prn flares, avoid face, groin, axilla Cont Calcipotriene  cr qd to bid aa psoriasis arms and legs prn flares  Topical steroids (such as triamcinolone,  fluocinolone, fluocinonide, mometasone, clobetasol , halobetasol, betamethasone , hydrocortisone) can cause thinning and lightening of the skin if they are used for too long in the same area. Your physician has selected the right strength medicine for your problem and area affected on the body. Please use your medication only as directed by your physician to prevent side effects.    HISTORY OF SQUAMOUS CELL CARCINOMA OF THE SKIN - right anterior thigh, excised 03/2023 - No evidence of recurrence today - Recommend regular full body skin exams - Recommend daily broad spectrum sunscreen SPF 30+ to sun-exposed areas, reapply every 2 hours as needed.  - Call if any new or changing lesions are noted between office visits  HEMANGIOMA Exam: red papule(s) Discussed benign nature. Recommend observation. Call for changes.  MELANOCYTIC NEVI Exam: Tan-brown and/or pink-flesh-colored symmetric macules and papules  Treatment Plan: Benign appearing on exam today. Recommend observation. Call clinic for new or changing moles. Recommend daily use of broad spectrum spf 30+ sunscreen to sun-exposed areas.   SEBORRHEIC KERATOSIS - Stuck-on, waxy, tan-brown papules and/or plaques  - Benign-appearing - Discussed benign etiology and prognosis. - Observe - Call for any changes   AK (ACTINIC KERATOSIS) (3) Nose x 3 (3) Actinic keratoses are precancerous spots that appear secondary to cumulative UV radiation exposure/sun exposure over time. They are chronic with expected duration over 1 year. A portion of actinic keratoses will progress to squamous cell carcinoma of the skin. It is not possible to reliably predict which spots will progress to skin cancer and so treatment is recommended to prevent development of skin cancer.  Recommend daily broad spectrum sunscreen SPF 30+  to sun-exposed areas, reapply every 2 hours as needed.  Recommend staying in the shade or wearing long sleeves, sun glasses (UVA+UVB protection)  and wide brim hats (4-inch brim around the entire circumference of the hat). Call for new or changing lesions. Destruction of lesion - Nose x 3 (3)  Destruction method: cryotherapy   Informed consent: discussed and consent obtained   Lesion destroyed using liquid nitrogen: Yes   Region frozen until ice ball extended beyond lesion: Yes   Outcome: patient tolerated procedure well with no complications   Post-procedure details: wound care instructions given   Additional details:  Prior to procedure, discussed risks of blister formation, small wound, skin dyspigmentation, or rare scar following cryotherapy. Recommend Vaseline ointment to treated areas while healing.   NEOPLASM OF UNCERTAIN BEHAVIOR OF SKIN left mid calf Epidermal / dermal shaving  Lesion diameter (cm):  1.1 Informed consent: discussed and consent obtained   Patient was prepped and draped in usual sterile fashion: Area prepped with alcohol. Anesthesia: the lesion was anesthetized in a standard fashion   Anesthetic:  1% lidocaine w/ epinephrine 1-100,000 buffered w/ 8.4% NaHCO3 Instrument used: flexible razor blade   Hemostasis achieved with: pressure, aluminum chloride and electrodesiccation   Outcome: patient tolerated procedure well    Destruction of lesion  Destruction method: electrodesiccation and curettage   Informed consent: discussed and consent obtained   Curettage performed in three different directions: Yes   Electrodesiccation performed over the curetted area: Yes   Final wound size (cm):  1.1 Hemostasis achieved with:  pressure, aluminum chloride and electrodesiccation Outcome: patient tolerated procedure well with no complications   Post-procedure details: sterile dressing applied and wound care instructions given   Dressing type: bandage and petrolatum    Specimen 1 - Surgical pathology Differential Diagnosis: r/o BCC  Check Margins: No Treated with EDC  Return in about 6 months (around 08/07/2024)  for with Dr. Jackquline, HxSCC, Psoriasis.  LILLETTE Lonell Drones, RMA, am acting as scribe for Rexene Jackquline, MD .   Documentation: I have reviewed the above documentation for accuracy and completeness, and I agree with the above.  Rexene Jackquline, MD

## 2024-02-05 NOTE — Patient Instructions (Addendum)
 Treatment Plan: Cont Clobetasol  cream 1-2 times daily to psoriasis on arms legs prn flares, avoid face, groin, axilla Cont Calcipotriene  cream 1-2 times daily to psoriasis arms and legs prn flares  Topical steroids (such as triamcinolone, fluocinolone, fluocinonide, mometasone, clobetasol , halobetasol, betamethasone , hydrocortisone) can cause thinning and lightening of the skin if they are used for too long in the same area. Your physician has selected the right strength medicine for your problem and area affected on the body. Please use your medication only as directed by your physician to prevent side effects.   Cryotherapy Aftercare  Wash gently with soap and water everyday.   Apply Vaseline and Band-Aid daily until healed.   Wound Care Instructions  Cleanse wound gently with soap and water once a day then pat dry with clean gauze. Apply a thin coat of Petrolatum (petroleum jelly, Vaseline) over the wound (unless you have an allergy to this). We recommend that you use a new, sterile tube of Vaseline. Do not pick or remove scabs. Do not remove the yellow or white healing tissue from the base of the wound.  Cover the wound with fresh, clean, nonstick gauze and secure with paper tape. You may use Band-Aids in place of gauze and tape if the wound is small enough, but would recommend trimming much of the tape off as there is often too much. Sometimes Band-Aids can irritate the skin.  You should call the office for your biopsy report after 1 week if you have not already been contacted.  If you experience any problems, such as abnormal amounts of bleeding, swelling, significant bruising, significant pain, or evidence of infection, please call the office immediately.  FOR ADULT SURGERY PATIENTS: If you need something for pain relief you may take 1 extra strength Tylenol  (acetaminophen ) AND 2 Ibuprofen (200mg  each) together every 4 hours as needed for pain. (do not take these if you are allergic  to them or if you have a reason you should not take them.) Typically, you may only need pain medication for 1 to 3 days.   Due to recent changes in healthcare laws, you may see results of your pathology and/or laboratory studies on MyChart before the doctors have had a chance to review them. We understand that in some cases there may be results that are confusing or concerning to you. Please understand that not all results are received at the same time and often the doctors may need to interpret multiple results in order to provide you with the best plan of care or course of treatment. Therefore, we ask that you please give us  2 business days to thoroughly review all your results before contacting the office for clarification. Should we see a critical lab result, you will be contacted sooner.   If You Need Anything After Your Visit  If you have any questions or concerns for your doctor, please call our main line at [phone] and press option 4 to reach your doctor's medical assistant. If no one answers, please leave a voicemail as directed and we will return your call as soon as possible. Messages left after 4 pm will be answered the following business day.   You may also send us  a message via MyChart. We typically respond to MyChart messages within 1-2 business days.  For prescription refills, please ask your pharmacy to contact our office. Our fax number is [phone].  If you have an urgent issue when the clinic is closed that cannot wait until the next business day,  you can page your doctor at the number below.    Please note that while we do our best to be available for urgent issues outside of office hours, we are not available 24/7.   If you have an urgent issue and are unable to reach us , you may choose to seek medical care at your doctor's office, retail clinic, urgent care center, or emergency room.  If you have a medical emergency, please immediately call 911 or go to the emergency  department.  Pager Numbers  - Dr. Hester: [phone]  - Dr. Jackquline: [phone]  - Dr. Claudene: [phone]   In the event of inclement weather, please call our main line at [phone] for an update on the status of any delays or closures.  Dermatology Medication Tips: Please keep the boxes that topical medications come in in order to help keep track of the instructions about where and how to use these. Pharmacies typically print the medication instructions only on the boxes and not directly on the medication tubes.   If your medication is too expensive, please contact our office at [phone] option 4 or send us  a message through MyChart.   We are unable to tell what your co-pay for medications will be in advance as this is different depending on your insurance coverage. However, we may be able to find a substitute medication at lower cost or fill out paperwork to get insurance to cover a needed medication.   If a prior authorization is required to get your medication covered by your insurance company, please allow us  1-2 business days to complete this process.  Drug prices often vary depending on where the prescription is filled and some pharmacies may offer cheaper prices.  The website www.goodrx.com contains coupons for medications through different pharmacies. The prices here do not account for what the cost may be with help from insurance (it may be cheaper with your insurance), but the website can give you the price if you did not use any insurance.  - You can print the associated coupon and take it with your prescription to the pharmacy.  - You may also stop by our office during regular business hours and pick up a GoodRx coupon card.  - If you need your prescription sent electronically to a different pharmacy, notify our office through [facility] or by phone at [phone] option 4.     Si Usted Necesita Algo Despus de Su Visita  Tambin puede enviarnos  un mensaje a travs de Clinical cytogeneticist. Por lo general respondemos a los mensajes de MyChart en el transcurso de 1 a 2 das hbiles.  Para renovar recetas, por favor pida a su farmacia que se ponga en contacto con nuestra oficina. Randi lakes de fax es [place] [phone].  Si tiene un asunto urgente cuando la clnica est cerrada y que no puede esperar hasta el siguiente da hbil, puede llamar/localizar a su doctor(a) al nmero que aparece a continuacin.   Por favor, tenga en cuenta que aunque hacemos todo lo posible para estar disponibles para asuntos urgentes fuera del horario de [place], no estamos disponibles las 24 horas del da, los 7 [address]  [address] de la [place].   Si tiene un problema urgente y no puede comunicarse con nosotros, puede optar por buscar atencin mdica  en el consultorio de su doctor(a), en una clnica privada, en un centro de atencin urgente o en una sala de emergencias.  Si tiene una emergencia mdica, por favor llame inmediatamente al 911 o vaya  a la sala de Sports administrator.  Nmeros de bper  - Dr. Hester: [phone]  - Dra. Jackquline: [phone]  - Dr. Claudene: [phone]   En caso de inclemencias del tiempo, por favor llame a landry capes principal al [phone] para una actualizacin sobre el [place] de cualquier retraso o cierre.  Consejos para la medicacin en dermatologa: Por favor, guarde las cajas en las que vienen los medicamentos de uso tpico para ayudarle a seguir las instrucciones sobre dnde y cmo usarlos. Las farmacias generalmente imprimen las instrucciones del medicamento slo en las cajas y no directamente en los tubos del [place].   Si su medicamento es muy caro, por favor, pngase en contacto con landry rieger llamando al [phone] y presione la opcin 4 o envenos un mensaje a travs de Clinical cytogeneticist.   No podemos decirle cul ser su copago por los medicamentos por adelantado ya que esto es diferente dependiendo de la cobertura de su seguro. Sin  embargo, es posible que podamos encontrar un medicamento sustituto a Audiological scientist un formulario para que el seguro cubra el medicamento que se considera necesario.   Si se requiere una autorizacin previa para que su compaa de seguros malta su medicamento, por favor permtanos de 1 a 2 das hbiles para completar este proceso.  Los precios de los medicamentos varan con frecuencia dependiendo del Environmental consultant de dnde se surte la receta y alguna farmacias pueden ofrecer precios ms baratos.  El sitio web www.goodrx.com tiene cupones para medicamentos de Health and safety inspector. Los precios aqu no tienen en cuenta lo que podra costar con la ayuda del seguro (puede ser ms barato con su seguro), pero el sitio web puede darle el precio si no utiliz Tourist information centre manager.  - Puede imprimir el cupn correspondiente y llevarlo con su receta a la farmacia.  - Tambin puede pasar por nuestra oficina durante el horario de atencin regular y Education officer, museum una tarjeta de cupones de GoodRx.  - Si necesita que su receta se enve electrnicamente a una farmacia diferente, informe a nuestra oficina a travs de MyChart de [place] o por telfono llamando al [phone] y presione la opcin 4.

## 2024-02-06 LAB — SURGICAL PATHOLOGY

## 2024-02-09 ENCOUNTER — Encounter: Payer: Self-pay | Admitting: Dermatology

## 2024-02-09 ENCOUNTER — Ambulatory Visit: Payer: Self-pay | Admitting: Dermatology

## 2024-02-09 NOTE — Telephone Encounter (Signed)
 Left pt msg to call for bx result/sh

## 2024-02-09 NOTE — Telephone Encounter (Signed)
-----   Message from Rexene Rattler sent at 02/09/2024 10:38 AM EDT ----- 1. Skin, left mid calf :       BASAL CELL CARCINOMA, MICRONODULAR PATTERN  BCC skin cancer- already treated with EDC at time of biopsy   - please call patient ----- Message ----- From: Interface, Lab In Three Zero One Sent: 02/06/2024  10:44 AM EDT To: Rexene Rattler, MD

## 2024-02-10 NOTE — Telephone Encounter (Signed)
 Patient advised of BX results. aw

## 2024-08-10 ENCOUNTER — Ambulatory Visit: Admitting: Dermatology

## 2024-08-16 ENCOUNTER — Other Ambulatory Visit: Payer: Self-pay

## 2024-08-16 ENCOUNTER — Encounter: Payer: Self-pay | Admitting: Hospitalist

## 2024-08-16 ENCOUNTER — Inpatient Hospital Stay

## 2024-08-16 ENCOUNTER — Inpatient Hospital Stay
Admission: EM | Admit: 2024-08-16 | Discharge: 2024-08-20 | DRG: 853 | Disposition: A | Discharge status: Home / Self Care | Source: Ambulatory Visit

## 2024-08-16 ENCOUNTER — Emergency Department

## 2024-08-16 DIAGNOSIS — R6520 Severe sepsis without septic shock: Secondary | ICD-10-CM | POA: Diagnosis not present

## 2024-08-16 DIAGNOSIS — J9601 Acute respiratory failure with hypoxia: Secondary | ICD-10-CM | POA: Diagnosis present

## 2024-08-16 DIAGNOSIS — E669 Obesity, unspecified: Secondary | ICD-10-CM | POA: Diagnosis present

## 2024-08-16 DIAGNOSIS — N393 Stress incontinence (female) (male): Secondary | ICD-10-CM | POA: Diagnosis present

## 2024-08-16 DIAGNOSIS — N390 Urinary tract infection, site not specified: Secondary | ICD-10-CM | POA: Diagnosis present

## 2024-08-16 DIAGNOSIS — N130 Hydronephrosis with ureteropelvic junction obstruction: Secondary | ICD-10-CM | POA: Diagnosis not present

## 2024-08-16 DIAGNOSIS — Z8589 Personal history of malignant neoplasm of other organs and systems: Secondary | ICD-10-CM

## 2024-08-16 DIAGNOSIS — Z6826 Body mass index (BMI) 26.0-26.9, adult: Secondary | ICD-10-CM

## 2024-08-16 DIAGNOSIS — A419 Sepsis, unspecified organism: Secondary | ICD-10-CM | POA: Diagnosis not present

## 2024-08-16 DIAGNOSIS — N1831 Chronic kidney disease, stage 3a: Secondary | ICD-10-CM | POA: Diagnosis present

## 2024-08-16 DIAGNOSIS — I10 Essential (primary) hypertension: Secondary | ICD-10-CM | POA: Diagnosis present

## 2024-08-16 DIAGNOSIS — F32A Depression, unspecified: Secondary | ICD-10-CM | POA: Diagnosis present

## 2024-08-16 DIAGNOSIS — I214 Non-ST elevation (NSTEMI) myocardial infarction: Secondary | ICD-10-CM | POA: Diagnosis present

## 2024-08-16 DIAGNOSIS — I130 Hypertensive heart and chronic kidney disease with heart failure and stage 1 through stage 4 chronic kidney disease, or unspecified chronic kidney disease: Secondary | ICD-10-CM | POA: Diagnosis present

## 2024-08-16 DIAGNOSIS — Z1152 Encounter for screening for COVID-19: Secondary | ICD-10-CM | POA: Diagnosis not present

## 2024-08-16 DIAGNOSIS — N1330 Unspecified hydronephrosis: Secondary | ICD-10-CM | POA: Diagnosis present

## 2024-08-16 DIAGNOSIS — I272 Pulmonary hypertension, unspecified: Secondary | ICD-10-CM | POA: Diagnosis present

## 2024-08-16 DIAGNOSIS — J189 Pneumonia, unspecified organism: Secondary | ICD-10-CM | POA: Diagnosis not present

## 2024-08-16 DIAGNOSIS — J159 Unspecified bacterial pneumonia: Secondary | ICD-10-CM | POA: Diagnosis present

## 2024-08-16 DIAGNOSIS — Z888 Allergy status to other drugs, medicaments and biological substances status: Secondary | ICD-10-CM

## 2024-08-16 DIAGNOSIS — N136 Pyonephrosis: Secondary | ICD-10-CM | POA: Diagnosis present

## 2024-08-16 DIAGNOSIS — J810 Acute pulmonary edema: Secondary | ICD-10-CM

## 2024-08-16 DIAGNOSIS — E039 Hypothyroidism, unspecified: Secondary | ICD-10-CM | POA: Diagnosis present

## 2024-08-16 DIAGNOSIS — E785 Hyperlipidemia, unspecified: Secondary | ICD-10-CM | POA: Diagnosis present

## 2024-08-16 DIAGNOSIS — Z1629 Resistance to other single specified antibiotic: Secondary | ICD-10-CM | POA: Diagnosis present

## 2024-08-16 DIAGNOSIS — R6521 Severe sepsis with septic shock: Secondary | ICD-10-CM | POA: Diagnosis present

## 2024-08-16 DIAGNOSIS — E871 Hypo-osmolality and hyponatremia: Secondary | ICD-10-CM | POA: Diagnosis present

## 2024-08-16 DIAGNOSIS — M810 Age-related osteoporosis without current pathological fracture: Secondary | ICD-10-CM | POA: Diagnosis present

## 2024-08-16 DIAGNOSIS — L409 Psoriasis, unspecified: Secondary | ICD-10-CM | POA: Diagnosis present

## 2024-08-16 DIAGNOSIS — Z8249 Family history of ischemic heart disease and other diseases of the circulatory system: Secondary | ICD-10-CM

## 2024-08-16 DIAGNOSIS — N12 Tubulo-interstitial nephritis, not specified as acute or chronic: Secondary | ICD-10-CM | POA: Diagnosis not present

## 2024-08-16 DIAGNOSIS — H409 Unspecified glaucoma: Secondary | ICD-10-CM | POA: Diagnosis present

## 2024-08-16 DIAGNOSIS — Z85828 Personal history of other malignant neoplasm of skin: Secondary | ICD-10-CM

## 2024-08-16 DIAGNOSIS — Z83438 Family history of other disorder of lipoprotein metabolism and other lipidemia: Secondary | ICD-10-CM

## 2024-08-16 DIAGNOSIS — R7881 Bacteremia: Secondary | ICD-10-CM | POA: Diagnosis present

## 2024-08-16 DIAGNOSIS — N179 Acute kidney failure, unspecified: Secondary | ICD-10-CM | POA: Diagnosis present

## 2024-08-16 DIAGNOSIS — Z79890 Hormone replacement therapy (postmenopausal): Secondary | ICD-10-CM

## 2024-08-16 DIAGNOSIS — I5032 Chronic diastolic (congestive) heart failure: Secondary | ICD-10-CM | POA: Diagnosis present

## 2024-08-16 DIAGNOSIS — Z79899 Other long term (current) drug therapy: Secondary | ICD-10-CM

## 2024-08-16 DIAGNOSIS — A4151 Sepsis due to Escherichia coli [E. coli]: Principal | ICD-10-CM | POA: Diagnosis present

## 2024-08-16 DIAGNOSIS — N3000 Acute cystitis without hematuria: Secondary | ICD-10-CM

## 2024-08-16 DIAGNOSIS — H3530 Unspecified macular degeneration: Secondary | ICD-10-CM | POA: Diagnosis present

## 2024-08-16 DIAGNOSIS — Z7982 Long term (current) use of aspirin: Secondary | ICD-10-CM

## 2024-08-16 LAB — TROPONIN T, HIGH SENSITIVITY
Troponin T High Sensitivity: 464 ng/L (ref 0–19)
Troponin T High Sensitivity: 533 ng/L (ref 0–19)

## 2024-08-16 LAB — PROCALCITONIN: Procalcitonin: 100 ng/mL

## 2024-08-16 LAB — CBC WITH DIFFERENTIAL/PLATELET
Abs Immature Granulocytes: 0.1 K/uL — ABNORMAL HIGH (ref 0.00–0.07)
Basophils Absolute: 0.1 K/uL (ref 0.0–0.1)
Basophils Relative: 1 %
Eosinophils Absolute: 0 K/uL (ref 0.0–0.5)
Eosinophils Relative: 0 %
HCT: 27.9 % — ABNORMAL LOW (ref 36.0–46.0)
Hemoglobin: 8.9 g/dL — ABNORMAL LOW (ref 12.0–15.0)
Immature Granulocytes: 1 %
Lymphocytes Relative: 4 %
Lymphs Abs: 0.8 K/uL (ref 0.7–4.0)
MCH: 26.5 pg (ref 26.0–34.0)
MCHC: 31.9 g/dL (ref 30.0–36.0)
MCV: 83 fL (ref 80.0–100.0)
Monocytes Absolute: 0.1 K/uL (ref 0.1–1.0)
Monocytes Relative: 0 %
Neutro Abs: 16.8 K/uL — ABNORMAL HIGH (ref 1.7–7.7)
Neutrophils Relative %: 94 %
Platelets: 279 K/uL (ref 150–400)
RBC: 3.36 MIL/uL — ABNORMAL LOW (ref 3.87–5.11)
RDW: 14.1 % (ref 11.5–15.5)
Smear Review: NORMAL
WBC: 17.9 K/uL — ABNORMAL HIGH (ref 4.0–10.5)
nRBC: 0 % (ref 0.0–0.2)

## 2024-08-16 LAB — COMPREHENSIVE METABOLIC PANEL WITH GFR
ALT: <5 U/L (ref 0–44)
AST: 66 U/L — ABNORMAL HIGH (ref 15–41)
Albumin: 3.2 g/dL — ABNORMAL LOW (ref 3.5–5.0)
Alkaline Phosphatase: 359 U/L — ABNORMAL HIGH (ref 38–126)
Anion gap: 18 — ABNORMAL HIGH (ref 5–15)
BUN: 18 mg/dL (ref 8–23)
CO2: 17 mmol/L — ABNORMAL LOW (ref 22–32)
Calcium: 8.5 mg/dL — ABNORMAL LOW (ref 8.9–10.3)
Chloride: 96 mmol/L — ABNORMAL LOW (ref 98–111)
Creatinine, Ser: 1.78 mg/dL — ABNORMAL HIGH (ref 0.44–1.00)
GFR, Estimated: 27 mL/min — ABNORMAL LOW
Glucose, Bld: 111 mg/dL — ABNORMAL HIGH (ref 70–99)
Potassium: 4 mmol/L (ref 3.5–5.1)
Sodium: 130 mmol/L — ABNORMAL LOW (ref 135–145)
Total Bilirubin: 0.5 mg/dL (ref 0.0–1.2)
Total Protein: 6.3 g/dL — ABNORMAL LOW (ref 6.5–8.1)

## 2024-08-16 LAB — APTT: aPTT: 50 s — ABNORMAL HIGH (ref 24–36)

## 2024-08-16 LAB — URINALYSIS, ROUTINE W REFLEX MICROSCOPIC
Bilirubin Urine: NEGATIVE
Glucose, UA: NEGATIVE mg/dL
Ketones, ur: NEGATIVE mg/dL
Nitrite: NEGATIVE
Protein, ur: 30 mg/dL — AB
Specific Gravity, Urine: 1.015 (ref 1.005–1.030)
pH: 5 (ref 5.0–8.0)

## 2024-08-16 LAB — RESP PANEL BY RT-PCR (RSV, FLU A&B, COVID)  RVPGX2
Influenza A by PCR: NEGATIVE
Influenza B by PCR: NEGATIVE
Resp Syncytial Virus by PCR: NEGATIVE
SARS Coronavirus 2 by RT PCR: NEGATIVE

## 2024-08-16 LAB — LACTIC ACID, PLASMA
Lactic Acid, Venous: 4.6 mmol/L (ref 0.5–1.9)
Lactic Acid, Venous: 2.7 mmol/L (ref 0.5–1.9)

## 2024-08-16 LAB — PRO BRAIN NATRIURETIC PEPTIDE: Pro Brain Natriuretic Peptide: >35000 pg/mL — ABNORMAL HIGH

## 2024-08-16 LAB — PROTIME-INR
INR: 1.2 (ref 0.8–1.2)
Prothrombin Time: 15.8 s — ABNORMAL HIGH (ref 11.4–15.2)

## 2024-08-16 MED ORDER — ONDANSETRON HCL 4 MG PO TABS: 4.0000 mg | ORAL_TABLET | Freq: Four times a day (QID) | ORAL | Status: DC | PRN

## 2024-08-16 MED ORDER — SODIUM CHLORIDE 0.9 % IV BOLUS
500.0000 mL | Freq: Once | INTRAVENOUS | Status: DC
  Administered 2024-08-16: 500 mL via INTRAVENOUS

## 2024-08-16 MED ORDER — ONDANSETRON HCL 4 MG/2ML IJ SOLN: 4.0000 mg | Freq: Four times a day (QID) | INTRAMUSCULAR | Status: DC | PRN

## 2024-08-16 MED ORDER — ACETAMINOPHEN 650 MG RE SUPP: 650.0000 mg | Freq: Four times a day (QID) | RECTAL | Status: DC | PRN

## 2024-08-16 MED ORDER — ENOXAPARIN SODIUM 30 MG/0.3ML IJ SOSY: 30.0000 mg | PREFILLED_SYRINGE | INTRAMUSCULAR | Status: DC

## 2024-08-16 MED ORDER — LOSARTAN POTASSIUM 50 MG PO TABS
50.0000 mg | ORAL_TABLET | Freq: Every day | ORAL | Status: DC
  Filled 2024-08-16: qty 1

## 2024-08-16 MED ORDER — SODIUM CHLORIDE 0.9 % IV SOLN
2.0000 g | INTRAVENOUS | Status: DC
  Administered 2024-08-16: 2 g via INTRAVENOUS
  Filled 2024-08-16: qty 20

## 2024-08-16 MED ORDER — POLYETHYLENE GLYCOL 3350 17 G PO PACK: 17.0000 g | PACK | Freq: Every day | ORAL | Status: DC | PRN

## 2024-08-16 MED ORDER — ACETAMINOPHEN 500 MG PO TABS
1000.0000 mg | ORAL_TABLET | Freq: Once | ORAL | Status: AC
  Administered 2024-08-16: 1000 mg via ORAL
  Filled 2024-08-16: qty 2

## 2024-08-16 MED ORDER — ACETAMINOPHEN 325 MG PO TABS
650.0000 mg | ORAL_TABLET | Freq: Four times a day (QID) | ORAL | Status: DC | PRN
  Administered 2024-08-16: 650 mg via ORAL
  Filled 2024-08-16: qty 2

## 2024-08-16 MED ORDER — ASPIRIN 81 MG PO TBEC
81.0000 mg | DELAYED_RELEASE_TABLET | Freq: Every day | ORAL | Status: DC
  Administered 2024-08-17 – 2024-08-20 (×4): 81 mg via ORAL
  Filled 2024-08-16 (×5): qty 1

## 2024-08-16 MED ORDER — SODIUM CHLORIDE 0.9 % IV SOLN
500.0000 mg | INTRAVENOUS | Status: DC
  Filled 2024-08-16: qty 5

## 2024-08-16 MED ORDER — SODIUM CHLORIDE 0.9 % IV BOLUS
500.0000 mL | Freq: Once | INTRAVENOUS | Status: AC
  Administered 2024-08-16: 500 mL via INTRAVENOUS

## 2024-08-16 MED ORDER — IPRATROPIUM-ALBUTEROL 0.5-2.5 (3) MG/3ML IN SOLN
3.0000 mL | Freq: Four times a day (QID) | RESPIRATORY_TRACT | Status: DC | PRN
  Administered 2024-08-17 – 2024-08-18 (×2): 3 mL via RESPIRATORY_TRACT
  Filled 2024-08-16 (×2): qty 3

## 2024-08-16 MED ORDER — LEVOTHYROXINE SODIUM 100 MCG PO TABS
100.0000 ug | ORAL_TABLET | Freq: Every day | ORAL | Status: DC
  Administered 2024-08-17 – 2024-08-20 (×3): 100 ug via ORAL
  Filled 2024-08-16 (×4): qty 1

## 2024-08-16 MED ORDER — VANCOMYCIN HCL 1250 MG/250ML IV SOLN
1250.0000 mg | Freq: Once | INTRAVENOUS | Status: AC
  Administered 2024-08-16: 1250 mg via INTRAVENOUS
  Filled 2024-08-16: qty 250

## 2024-08-16 MED ORDER — FENTANYL CITRATE (PF) 50 MCG/ML IJ SOSY: 12.5000 ug | PREFILLED_SYRINGE | INTRAMUSCULAR | Status: DC | PRN

## 2024-08-16 MED ORDER — ASPIRIN 81 MG PO CHEW
324.0000 mg | CHEWABLE_TABLET | Freq: Once | ORAL | Status: AC
  Administered 2024-08-16: 324 mg via ORAL
  Filled 2024-08-16: qty 4

## 2024-08-16 MED ORDER — HEPARIN BOLUS VIA INFUSION
3850.0000 [IU] | Freq: Once | INTRAVENOUS | Status: AC
  Administered 2024-08-16: 3850 [IU] via INTRAVENOUS
  Filled 2024-08-16: qty 3850

## 2024-08-16 MED ORDER — HEPARIN (PORCINE) 25000 UT/250ML-% IV SOLN
850.0000 [IU]/h | INTRAVENOUS | Status: DC
  Administered 2024-08-16: 750 [IU]/h via INTRAVENOUS
  Filled 2024-08-16: qty 250

## 2024-08-16 MED ORDER — METOPROLOL SUCCINATE ER 25 MG PO TB24
25.0000 mg | ORAL_TABLET | Freq: Every day | ORAL | Status: DC
  Administered 2024-08-16: 25 mg via ORAL
  Filled 2024-08-16: qty 1

## 2024-08-16 MED ORDER — ESCITALOPRAM OXALATE 10 MG PO TABS
10.0000 mg | ORAL_TABLET | Freq: Every day | ORAL | Status: DC
  Administered 2024-08-16 – 2024-08-20 (×5): 10 mg via ORAL
  Filled 2024-08-16 (×5): qty 1

## 2024-08-16 MED ORDER — SIMVASTATIN 20 MG PO TABS
20.0000 mg | ORAL_TABLET | Freq: Every day | ORAL | Status: DC
  Administered 2024-08-16 – 2024-08-20 (×5): 20 mg via ORAL
  Filled 2024-08-16 (×3): qty 1
  Filled 2024-08-16: qty 2
  Filled 2024-08-16: qty 1

## 2024-08-16 MED ORDER — OXYCODONE HCL 5 MG PO TABS: 5.0000 mg | ORAL_TABLET | ORAL | Status: DC | PRN

## 2024-08-16 MED ORDER — PIPERACILLIN-TAZOBACTAM 3.375 G IVPB 30 MIN
3.3750 g | Freq: Once | INTRAVENOUS | Status: AC
  Administered 2024-08-16: 3.375 g via INTRAVENOUS
  Filled 2024-08-16: qty 50

## 2024-08-16 MED ORDER — AZITHROMYCIN 250 MG PO TABS
500.0000 mg | ORAL_TABLET | Freq: Every day | ORAL | Status: DC
  Administered 2024-08-16 – 2024-08-17 (×2): 500 mg via ORAL
  Filled 2024-08-16: qty 1
  Filled 2024-08-16: qty 2

## 2024-08-16 NOTE — ED Provider Notes (Signed)
 "  [facility] Provider Note    Event Date/Time   First MD Initiated Contact with Patient 08/16/24 1345     (approximate)   History   Shortness of Breath  BIBEMS from home. Pt states they have been SOB for the past 3 days and today it has gotten worse. Pt's RR was 40 on EMS arrival and 78% SpO2 on RA. Pt placed on 15L nonrebreather, SpO2 came up to 97%. Pt has no lung disease hx, clear lungs on ausculation. Pt had a fall on 1/15, chest xray was done, no abnormalities, but pt states they have been feeling SOB since fall. Pt has no previous hx of Afib, EMS EKG showed Afib w/ rates ranging from 90-140. of NS given en route. Hx of HTN and kidney disease.   EMS VS:  100 HR 128/60 150 CBG 99.6 F 35 End Tidal    HPI Dawn Dickerson is a 89 y.o. female PMH hypertension, pulmonary hypertension, hyperlipidemia presents for evaluation of shortness of breath - Patient states over the past 3 days she has felt dyspnea on exertion and worsening shortness of breath.  Some cough.  No vomiting.  Some diarrhea.  No chest or abdominal pain.  Has noted some right flank discomfort over the past few weeks.  No obvious urinary symptoms though has had kidney infections in the past per her report. -Per EMS, patient was satting 78% on room air, improved with 15 L nonrebreather.  EMS also was on impression patient may have had a fall on 1/15 though appears patient actually fell around [place] Year's from my review of her clinic note on 08/12/2024.     Physical Exam   Triage Vital Signs: ED Triage Vitals  Encounter Vitals Group     BP 08/16/24 1346 136/67     Girls Systolic BP Percentile --      Girls Diastolic BP Percentile --      Boys Systolic BP Percentile --      Boys Diastolic BP Percentile --      Pulse Rate 08/16/24 1346 (!) 113     Resp 08/16/24 1346 (!) 26     Temp 08/16/24 1346 99.6 F (37.6 C)     Temp Source 08/16/24 1346 Oral     SpO2 08/16/24 1346 100 %      Weight 08/16/24 1348 141 lb (64 kg)     Height 08/16/24 1348 5' 3 (1.6 m)     Head Circumference --      Peak Flow --      Pain Score 08/16/24 1347 0     Pain Loc --      Pain Education --      Exclude from Growth Chart --     Most recent vital signs: Vitals:   08/16/24 1446 08/16/24 1448  BP: (!) 77/63 (!) 105/53  Pulse:    Resp:    Temp:    SpO2:     Warm to touch, oral temperature 101 Fahrenheit at time of my eval  General: Awake, no distress.  CV:  Good peripheral perfusion.  Mild tachycardia, regular rhythm, RP 2+ Resp:  Apneic, able to speak full sentences.  Good airflow, no wheezing, coarse breath sounds right lower lung field Abd:  No distention. Nontender to deep palpation throughout    ED Results / Procedures / Treatments   Labs (all labs ordered are listed, but only abnormal results are displayed) Labs Reviewed  COMPREHENSIVE METABOLIC PANEL WITH  GFR - Abnormal; Notable for the following components:      Result Value   Sodium 130 (*)    Chloride 96 (*)    CO2 17 (*)    Glucose, Bld 111 (*)    Creatinine, Ser 1.78 (*)    Calcium 8.5 (*)    Total Protein 6.3 (*)    Albumin 3.2 (*)    AST 66 (*)    Alkaline Phosphatase 359 (*)    GFR, Estimated 27 (*)    Anion gap 18 (*)    All other components within normal limits  PROTIME-INR - Abnormal; Notable for the following components:   Prothrombin Time 15.8 (*)    All other components within normal limits  PRO BRAIN NATRIURETIC PEPTIDE - Abnormal; Notable for the following components:   Pro Brain Natriuretic Peptide >35,000.0 (*)    All other components within normal limits  CBC WITH DIFFERENTIAL/PLATELET - Abnormal; Notable for the following components:   WBC 17.9 (*)    RBC 3.36 (*)    Hemoglobin 8.9 (*)    HCT 27.9 (*)    Neutro Abs 16.8 (*)    Abs Immature Granulocytes 0.10 (*)    All other components within normal limits  LACTIC ACID, PLASMA - Abnormal; Notable for the following components:    Lactic Acid, Venous 4.6 (*)    All other components within normal limits  TROPONIN T, HIGH SENSITIVITY - Abnormal; Notable for the following components:   Troponin T High Sensitivity 464 (*)    All other components within normal limits  RESP PANEL BY RT-PCR (RSV, FLU A&B, COVID)  RVPGX2  CULTURE, BLOOD (ROUTINE X 2)  CULTURE, BLOOD (ROUTINE X 2)  URINALYSIS, ROUTINE W REFLEX MICROSCOPIC  LACTIC ACID, PLASMA  TROPONIN T, HIGH SENSITIVITY     EKG  See ED course below   RADIOLOGY Radiology interpreted myself radiology report reviewed.  Concerning for pulmonary edema.    PROCEDURES:  Critical Care performed: Yes, see critical care procedure note(s)  .Critical Care  Performed by: Clarine Ozell LABOR, MD Authorized by: Clarine Ozell LABOR, MD   Critical care provider statement:    Critical care time (minutes):  40   Critical care time was exclusive of:  Separately billable procedures and treating other patients   Critical care was necessary to treat or prevent imminent or life-threatening deterioration of the following conditions:  Respiratory failure, sepsis and cardiac failure   Critical care was time spent personally by me on the following activities:  Development of treatment plan with patient or surrogate, discussions with consultants, evaluation of patient's response to treatment, examination of patient, ordering and review of laboratory studies, ordering and review of radiographic studies, ordering and performing treatments and interventions, pulse oximetry, re-evaluation of patient's condition and review of old charts   I assumed direction of critical care for this patient from another provider in my specialty: no     Care discussed with: admitting provider      MEDICATIONS ORDERED IN ED: Medications  aspirin  chewable tablet 324 mg (has no administration in time range)  piperacillin -tazobactam (ZOSYN ) IVPB 3.375 g (has no administration in time range)  vancomycin  (VANCOREADY)  IVPB 1250 mg/250 mL (has no administration in time range)  acetaminophen  (TYLENOL ) tablet 1,000 mg (1,000 mg Oral Given 08/16/24 1439)  sodium chloride  0.9 % bolus 500 mL (500 mLs Intravenous New Bag/Given 08/16/24 1437)     IMPRESSION / MDM / ASSESSMENT AND PLAN / ED COURSE  I reviewed  the triage vital signs and the nursing notes.                              DDX/MDM/AP: Differential diagnosis includes, but is not limited to, viral syndrome including COVID-19 or influenza, consider pneumonia, UTI/pyelonephritis.  Do not suspect other intra-abdominal pathology at this time given very benign abdominal exam.  Given hypoxia, plan for admission.  Plan: - Supplemental oxygen --de-escalated to 3 L nonrebreather, satting 93-94% (when taken off oxygen she quickly drops to 89-90%) - Labs - Chest x-ray - EKG - Anticipate admission  Patient's presentation is most consistent with acute presentation with potential threat to life or bodily function.  The patient is on the cardiac monitor to evaluate for evidence of arrhythmia and/or significant heart rate changes.  ED course below.  Workup with leukocytosis to 18 with left shift, stable anemia.  Mild AKI on CKD.  Lactate notably elevated 4.6, BNP greater than 35,000 and troponin in the 460s.  No chest pain, repeat EKG stable, no clear evidence of ischemia.  GFR 15, deferring contrast study at this time.  Start empiric heparin  for ACS and possibility of underlying pulmonary embolism.  No obvious source of infection at this time, escalating to straight cath.  Admitting to hospitalist service.  Doing well on 3 L nasal cannula.  Avoiding further fluid resuscitation in the setting pulmonary edema, received 500 cc IV fluid bolus.  Treating with broad-spectrum antibiotics.  Clinical Course as of 08/16/24 1533  Mon Aug 16, 2024  1358 Ecg = sinus tachycardia, rate 109, no gross ST elevation or depression, left axis deviation, prolonged QT though otherwise normal  intervals no significant repolarization abnormality, no clear evidence of ischemia nor arrhythmia interpretation. [MM]  1511 Patient reevaluated, still with no chest pain  Remains satting well on 3 L nasal cannula  Giving urine sample now, also getting repeat EKG [MM]  1513 CMP with AKI on CKD, somewhat low bicarb, mild hyponatremia.  Very mild AST elevation and new alk phos elevation, nonspecific, benign abdominal exam [MM]    Clinical Course User Index [MM] Clarine Ozell LABOR, MD     FINAL CLINICAL IMPRESSION(S) / ED DIAGNOSES   Final diagnoses:  Septic shock (HCC)  Acute pulmonary edema (HCC)  Acute respiratory failure with hypoxia (HCC)     Rx / DC Orders   ED Discharge Orders     None        Note:  This document was prepared using Dragon voice recognition software and may include unintentional dictation errors.   Clarine Ozell LABOR, MD 08/16/24 1533  "

## 2024-08-16 NOTE — Consult Note (Signed)
 Pharmacy Consult Note - Anticoagulation  Pharmacy Consult for heparin  infusion Indication: chest pain/ACS Allergies[1]  PATIENT MEASUREMENTS: Height: 5' 3 (160 cm) Weight: 64 kg (141 lb) IBW/kg (Calculated) : 52.4 HEPARIN  DW (KG): 64  VITAL SIGNS: Temp: 101 F (38.3 C) (01/19 1404) Temp Source: Oral (01/19 1404) BP: 105/53 (01/19 1448) Pulse Rate: 113 (01/19 1346)  Recent Labs    08/16/24 1359  HGB 8.9*  HCT 27.9*  PLT 279  LABPROT 15.8*  INR 1.2  CREATININE 1.78*    Estimated Creatinine Clearance: 18.9 mL/min (A) (by C-G formula based on SCr of 1.78 mg/dL (H)).  PAST MEDICAL HISTORY: Past Medical History:  Diagnosis Date   Actinic keratosis    Basal cell carcinoma 02/05/2024   left mid calf, EDC   Cancer (HCC)    skin ca, right forearm   Glaucoma    Hyperlipemia    Macular degeneration    Osteoporosis    Psoriasis    Pulmonary hypertension (HCC)    SCC (squamous cell carcinoma) 03/25/2023   right anterior thigh, excision 04/09/23   Thyroid disease     Medications:  (Not in a hospital admission)  Scheduled:   heparin   3,850 Units Intravenous Once   Infusions:   azithromycin      cefTRIAXone  (ROCEPHIN )  IV     heparin      piperacillin -tazobactam 3.375 g (08/16/24 1536)   vancomycin      PRN: acetaminophen  **OR** acetaminophen , fentaNYL  (SUBLIMAZE ) injection, ondansetron  **OR** ondansetron  (ZOFRAN ) IV, oxyCODONE , polyethylene glycol Anti-infectives (From admission, onward)    Start     Dose/Rate Route Frequency Ordered Stop   08/16/24 2200  cefTRIAXone  (ROCEPHIN ) 2 g in sodium chloride  0.9 % 100 mL IVPB        2 g 200 mL/hr over 30 Minutes Intravenous Every 24 hours 08/16/24 1551 08/21/24 2159   08/16/24 1600  vancomycin  (VANCOREADY) IVPB 1250 mg/250 mL        1,250 mg 166.7 mL/hr over 90 Minutes Intravenous  Once 08/16/24 1517     08/16/24 1600  azithromycin  (ZITHROMAX ) 500 mg in sodium chloride  0.9 % 250 mL IVPB        500 mg 250 mL/hr over 60  Minutes Intravenous Every 24 hours 08/16/24 1551 08/21/24 1559   08/16/24 1515  piperacillin -tazobactam (ZOSYN ) IVPB 3.375 g        3.375 g 100 mL/hr over 30 Minutes Intravenous  Once 08/16/24 1511         ASSESSMENT: 89 y.o. female with PMH A.fib, HTN, HLD is presenting with SOB abd chest pain . Patient is not on chronic anticoagulation per chart review. Pharmacy has been consulted to initiate and manage heparin  intravenous infusion.   Goal(s) of therapy: Heparin  level 0.3 - 0.7 units/mL aPTT 66 - 102 seconds Monitor platelets by anticoagulation protocol: Yes   Baseline anticoagulation labs: Recent Labs    08/16/24 1359  INR 1.2  HGB 8.9*  PLT 279    PLAN:  Give 3,850 units bolus x1; then start heparin  infusion at 750 units/hour.  Check heparin  level in 8 hours, then daily once at least two levels are consecutively therapeutic.  Monitor CBC daily while on heparin  infusion.   Annabella LOISE Banks, PharmD Clinical Pharmacist 08/16/2024 4:02 PM     [1]  Allergies Allergen Reactions   Methotrexate And Trimetrexate Shortness Of Breath   Prednisone  Other (See Comments)    Patient called and left message she could not take    Alendronate Diarrhea

## 2024-08-16 NOTE — Consult Note (Signed)
 ED Pharmacy Antibiotic Sign Off An antibiotic consult was received from an ED provider for sepsis per pharmacy dosing for vancomycin . A chart review was completed to assess appropriateness.   The following one time order(s) were placed:  Vancomycin  1250 mg LD x1  Further antibiotic and/or antibiotic pharmacy consults should be ordered by the admitting provider if indicated.   Thank you for allowing pharmacy to be a part of this patient's care.   Annabella LOISE Banks, [facility]  Clinical Pharmacist 08/16/24 3:18 PM

## 2024-08-16 NOTE — ED Triage Notes (Signed)
 BIBEMS from home. Pt states they have been SOB for the past 3 days and today it has gotten worse. Pt's RR was 40 on EMS arrival and 78% SpO2 on RA. Pt placed on 15L nonrebreather, SpO2 came up to 97%. Pt has no lung disease hx, clear lungs on ausculation. Pt had a fall on 1/15, chest xray was done, no abnormalities, but pt states they have been feeling SOB since fall. Pt has no previous hx of Afib, EMS EKG showed Afib w/ rates ranging from 90-140. of NS given en route. Hx of HTN and kidney disease.   EMS VS:  100 HR 128/60 150 CBG 99.6 F 35 End Tidal

## 2024-08-16 NOTE — H&P (Signed)
 "  History and Physical    Dawn Dickerson FMW:[id number] DOB: January 24, 1932 DOA: 08/16/2024  DOS: the patient was seen and examined on 08/16/2024  PCP: Fernande Ophelia JINNY DOUGLAS, MD   Patient coming from: Home  I have personally briefly reviewed patient's old medical records in [facility]  Chief Complaint: Shortness of breath for 3 days  HPI: Dawn Dickerson is a pleasant 89 y.o. female with medical history significant for HTN, HLD who came in to [facility] ED from home for progressive worsening shortness of breath for 3 days.  Patient also stated that she was not able to breathe and her heart rate was fast.  Patient normally does not use oxygen. Per EMS, oxygen saturation was 78% on room air.  Placed on 15 L nonrebreather, SpO2 came up to 97%.  Patient had a fall on 1/15 and chest x-ray was done no fractures.  Patient has no history of A-fib, EMS monitor revealed some A-fib with RVR.  Patient was given 500 cc of normal saline and route.  EMS vitals heart rate 100, blood pressure 128/60, temperature 99.6. Patient is not a good historian.  Patient's son was at bedside.  But they did not have much information except patient was feeling weak and short of breath for the last 3 days.  Denied any history of fever, chills, nausea, vomiting, diarrhea, abdominal pain, dysuria, hematuria.  She had a recent fall on New Year's Day where she went to the clinic and got the chest x-ray and was told that there was no problem.  ED Course: Upon arrival to the ED, patient is found to be tachycardic at 113, tachypneic at 26, 99.6 F temperature, saturating 100% on nonrebreather.  Blood pressure was 77/63, 105/53.  Sodium was 130, creatinine was 1.78, AST was 66, alkaline phosphatase 359, anion gap was 18.  proBNP was 35,000, white count was 17.9, hemoglobin 8.9, hematocrit 27.9 troponin was 464, lactic acid was 4.6.  Blood cultures were sent, respiratory viral panel were sent which was negative, urine analysis was sent.  Chest  x-ray showed finding concerning for pulmonary edema.  Patient was started on 324 mg of aspirin , antibiotic Zosyn , vancomycin  and received 500 cc of normal saline.  Patient was also started on heparin  drip.  Hospitalist service was consulted for evaluation for admission.  Review of Systems:  ROS  All other systems negative except as noted in the HPI.  Past Medical History:  Diagnosis Date   Actinic keratosis    Basal cell carcinoma 02/05/2024   left mid calf, EDC   Cancer (HCC)    skin ca, right forearm   Glaucoma    Hyperlipemia    Macular degeneration    Osteoporosis    Psoriasis    Pulmonary hypertension (HCC)    SCC (squamous cell carcinoma) 03/25/2023   right anterior thigh, excision 04/09/23   Thyroid disease     Past Surgical History:  Procedure Laterality Date   BREAST BIOPSY Bilateral    neg   TONSILLECTOMY       reports that she has never smoked. She has never used smokeless tobacco. She reports that she does not drink alcohol and does not use drugs.  Allergies[1]  Family History  Problem Relation Age of Onset   Hyperlipidemia Mother    Coronary artery disease Mother    Breast cancer Neg Hx     Prior to Admission medications  Medication Sig Start Date End Date Taking? Authorizing Provider  cefUROXime (CEFTIN)  250 MG tablet Take 250 mg by mouth 2 (two) times daily with a meal. 08/16/24 08/23/24 Yes [provider]  clobetasol  cream (TEMOVATE ) 0.05 % Spot treat affected areas psoriasis once to twice daily until improved. Avoid face, groin, axilla. 10/08/22  Yes Jackquline Sawyer, MD  losartan  (COZAAR ) 50 MG tablet Take 50 mg by mouth daily. 02/09/24 02/08/25 Yes [provider]  albuterol  (VENTOLIN  HFA) 108 (90 Base) MCG/ACT inhaler Inhale 2 puffs into the lungs every 6 (six) hours as needed for wheezing or shortness of breath. 04/17/23   Barbarann Nest, MD  aspirin  EC 81 MG tablet Take by mouth.    [provider]  Calcium 600-400 MG-UNIT CHEW  Chew by mouth.    [provider]  Cholecalciferol  (VITAMIN D -1000 MAX ST) 25 MCG (1000 UT) tablet Take 1,000 Units by mouth daily. 12/05/21   [provider]  escitalopram  (LEXAPRO ) 10 MG tablet Take 1 tablet by mouth daily. 01/03/22   [provider]  Dextromethorphan -guaiFENesin  20-200 MG/20ML LIQD Take 10 mLs by mouth every 4 (four) hours as needed (chest congestion). 04/17/23   Barbarann Nest, MD  latanoprost  (XALATAN ) 0.005 % ophthalmic solution Place 1 drop into the right eye at bedtime.    [provider]  levothyroxine  (SYNTHROID ) 100 MCG tablet Take 100 mcg by mouth daily.    [provider]  metoprolol  succinate (TOPROL -XL) 25 MG 24 hr tablet Take 25 mg by mouth daily. 02/28/22   [provider]  Multiple Vitamin (MULTIVITAMIN) tablet Take 1 tablet by mouth daily.    [provider]  Multiple Vitamins-Minerals (PRESERVISION AREDS 2+MULTI VIT PO) Take by mouth.    [provider]  mupirocin  ointment (BACTROBAN ) 2 % APPLY ONCE DAILY WITH BANDAGE CHANGE. 04/24/23   Claudene Lehmann, MD  simvastatin  (ZOCOR ) 20 MG tablet Take 20 mg by mouth daily.    [provider]  timolol  (TIMOPTIC ) 0.5 % ophthalmic solution Place 1 drop into both eyes daily. 01/16/23   [provider]    Physical Exam: Vitals:   08/16/24 1439 08/16/24 1446 08/16/24 1448 08/16/24 1646  BP: (!) 82/35 (!) 77/63 (!) 105/53   Pulse:      Resp:      Temp:    98 F (36.7 C)  TempSrc:    Oral  SpO2:      Weight:      Height:        Physical Exam   Constitutional: Alert, awake, calm, comfortable HEENT: Neck supple Respiratory: Clear to auscultation B/L, no wheezing, no rales.  Cardiovascular: Regular rate and rhythm, no murmurs / rubs / gallops. No extremity edema. 2+ pedal pulses. No carotid bruits.  Abdomen: Soft, no tenderness, Bowel sounds positive.  Musculoskeletal: no clubbing / cyanosis. Good ROM, no contractures. Normal  muscle tone.  Skin: no rashes, lesions, ulcers. Neurologic: CN 2-12 grossly intact. Sensation intact, No focal deficit identified Psychiatric: Alert and oriented x 3. Normal mood.    Labs on Admission: I have personally reviewed following labs and imaging studies  CBC: Recent Labs  Lab 08/16/24 1359  WBC 17.9*  NEUTROABS 16.8*  HGB 8.9*  HCT 27.9*  MCV 83.0  PLT 279   Basic Metabolic Panel: Recent Labs  Lab 08/16/24 1359  NA 130*  K 4.0  CL 96*  CO2 17*  GLUCOSE 111*  BUN 18  CREATININE 1.78*  CALCIUM 8.5*   GFR: Estimated Creatinine Clearance: 18.9 mL/min (A) (by C-G formula based on SCr of 1.78  mg/dL (H)). Liver Function Tests: Recent Labs  Lab 08/16/24 1359  AST 66*  ALT <5  ALKPHOS 359*  BILITOT 0.5  PROT 6.3*  ALBUMIN 3.2*   No results for input(s): LIPASE, AMYLASE in the last 168 hours. No results for input(s): AMMONIA in the last 168 hours. Coagulation Profile: Recent Labs  Lab 08/16/24 1359  INR 1.2   Cardiac Enzymes: No results for input(s): CKTOTAL, CKMB, CKMBINDEX, TROPONINI, TROPONINIHS in the last 168 hours. BNP (last 3 results) No results for input(s): BNP in the last 8760 hours. HbA1C: No results for input(s): HGBA1C in the last 72 hours. CBG: No results for input(s): GLUCAP in the last 168 hours. Lipid Profile: No results for input(s): CHOL, HDL, LDLCALC, TRIG, CHOLHDL, LDLDIRECT in the last 72 hours. Thyroid Function Tests: No results for input(s): TSH, T4TOTAL, FREET4, T3FREE, THYROIDAB in the last 72 hours. Anemia Panel: No results for input(s): VITAMINB12, FOLATE, FERRITIN, TIBC, IRON, RETICCTPCT in the last 72 hours. Urine analysis:    Component Value Date/Time   COLORURINE AMBER (A) 08/16/2024 1432   APPEARANCEUR CLOUDY (A) 08/16/2024 1432   APPEARANCEUR CLEAR 01/12/2014 1505   LABSPEC 1.015 08/16/2024 1432   LABSPEC 1.005 01/12/2014 1505   PHURINE 5.0 08/16/2024  1432   GLUCOSEU NEGATIVE 08/16/2024 1432   GLUCOSEU NEGATIVE 01/12/2014 1505   HGBUR MODERATE (A) 08/16/2024 1432   BILIRUBINUR NEGATIVE 08/16/2024 1432   BILIRUBINUR NEGATIVE 01/12/2014 1505   KETONESUR NEGATIVE 08/16/2024 1432   PROTEINUR 30 (A) 08/16/2024 1432   NITRITE NEGATIVE 08/16/2024 1432   LEUKOCYTESUR SMALL (A) 08/16/2024 1432   LEUKOCYTESUR 1+ 01/12/2014 1505    Radiological Exams on Admission: I have personally reviewed images DG Chest Portable 1 View Result Date: 08/16/2024 CLINICAL DATA:  Hypoxia. EXAM: PORTABLE CHEST 1 VIEW COMPARISON:  04/15/2023. FINDINGS: Low lung volumes. Cardiomegaly. Chronic elevation of the right hemidiaphragm. Bilateral interstitial prominence, most pronounced at the lung bases, suspicious for pulmonary vascular congestion with possible mild interstitial pulmonary edema. Mild right basilar atelectasis. No pneumothorax. No acute osseous abnormality. IMPRESSION: 1. Low lung volumes with bilateral interstitial prominence most pronounced at the lung bases, suspicious for pulmonary vascular congestion with possible mild interstitial edema. 2. Cardiomegaly. 3. Chronic elevation of the right hemidiaphragm with mild right basilar atelectasis. Electronically Signed   By: Harrietta Sherry M.D.   On: 08/16/2024 15:20    EKG: My personal interpretation of EKG shows: Sinus tachycardia at 106 bpm, no ST elevations    Assessment/Plan Principal Problem:   CAP (community acquired pneumonia) Active Problems:   HTN (hypertension)   Hyperlipidemia   Hypothyroidism   UTI (urinary tract infection)   AKI (acute kidney injury)   Non-ST elevation myocardial infarction (NSTEMI) (HCC)    Assessment and Plan: 89 year old female double/PMH of HTN, HLD, hypothyroidism, depression who was brought in from home for acute worsening shortness of breath over 3 days with some cough and congestion.  Patient is found to have leukocytosis, possible pneumonia, UTI, elevated  troponin and some congestive changes.  1.  Community-acquired pneumonia/acute hypoxemic respiratory failure due to pneumonia - Patient will be admitted to hospital as inpatient - Received antibiotic Zosyn  and vancomycin  in the emergency room. - Cultures were sent. - Patient will be given ceftriaxone  and azithromycin  while she is in the hospital. - Will follow cultures. - Will continue oxygen to maintain saturation more than 90%.  2.  Possible congestive heart failure/elevated troponin/NSTEMI - Troponins are elevated to 464. - May be related to non-ST elevation  myocardial infarction versus sepsis versus CHF - Does not have a history of congestive heart failure or coronary artery disease - Patient was started on heparin  drip in the emergency room. - Will get echocardiogram and trend troponin monitor on telemetry. - Cardiology consult.  3.  Possible urinary tract infection - UA is positive. - Patient is already on ceftriaxone . - Follow-up cultures.  4.  AKI - Patient received IV fluid in the emergency room. - There is concern that patient has congestive heart failure. - I will hold off IV fluid at this point - Avoid nephrotoxic drugs - Will monitor kidney function - Will follow the recommendation from cardiology if there is any need.  I am concerned about IV fluid at this point.  5.  HTN/HLD - Resume her home medications simvastatin , metoprolol  and losartan  50 mg. - Continue to monitor blood pressure  6.  Hypothyroidism - Resume her home dose of levothyroxine   7.  Depression - Patient will be continued on her home dose of Lexapro   8.  Hyponatremia - May be related to part of CHF - Will continue to monitor sodium level      DVT prophylaxis: IV heparin  gtts Code Status: Full Code Family Communication: Son and daughter-in-law were at bedside Disposition Plan: Home versus rehab Consults called: Cardiology Admission status: Inpatient, Telemetry bed   Nena Rebel,  MD [facility] 08/16/2024, 4:49 PM        [1]  Allergies Allergen Reactions   Methotrexate And Trimetrexate Shortness Of Breath   Prednisone  Other (See Comments)    Patient called and left message she could not take    Alendronate Diarrhea   "

## 2024-08-17 ENCOUNTER — Inpatient Hospital Stay: Admit: 2024-08-17 | Discharge: 2024-08-17 | Disposition: A | Attending: Hospitalist

## 2024-08-17 DIAGNOSIS — N12 Tubulo-interstitial nephritis, not specified as acute or chronic: Secondary | ICD-10-CM

## 2024-08-17 DIAGNOSIS — N179 Acute kidney failure, unspecified: Secondary | ICD-10-CM

## 2024-08-17 DIAGNOSIS — N1330 Unspecified hydronephrosis: Secondary | ICD-10-CM | POA: Diagnosis present

## 2024-08-17 DIAGNOSIS — R7881 Bacteremia: Secondary | ICD-10-CM | POA: Diagnosis present

## 2024-08-17 DIAGNOSIS — E871 Hypo-osmolality and hyponatremia: Secondary | ICD-10-CM | POA: Diagnosis present

## 2024-08-17 DIAGNOSIS — N130 Hydronephrosis with ureteropelvic junction obstruction: Secondary | ICD-10-CM

## 2024-08-17 DIAGNOSIS — A4151 Sepsis due to Escherichia coli [E. coli]: Secondary | ICD-10-CM

## 2024-08-17 DIAGNOSIS — A419 Sepsis, unspecified organism: Secondary | ICD-10-CM

## 2024-08-17 LAB — CBC
HCT: 25.9 % — ABNORMAL LOW (ref 36.0–46.0)
Hemoglobin: 8.5 g/dL — ABNORMAL LOW (ref 12.0–15.0)
MCH: 26.6 pg (ref 26.0–34.0)
MCHC: 32.8 g/dL (ref 30.0–36.0)
MCV: 81.2 fL (ref 80.0–100.0)
Platelets: 274 K/uL (ref 150–400)
RBC: 3.19 MIL/uL — ABNORMAL LOW (ref 3.87–5.11)
RDW: 14.2 % (ref 11.5–15.5)
WBC: 29.8 K/uL — ABNORMAL HIGH (ref 4.0–10.5)
nRBC: 0 % (ref 0.0–0.2)

## 2024-08-17 LAB — ECHOCARDIOGRAM COMPLETE
AR max vel: 1.6 cm2
AV Area VTI: 1.75 cm2
AV Area mean vel: 1.53 cm2
AV Mean grad: 8.5 mmHg
AV Peak grad: 15.6 mmHg
Ao pk vel: 1.97 m/s
Area-P 1/2: 3.02 cm2
Height: 63 in
P 1/2 time: 549 ms
S' Lateral: 2.2 cm
Weight: 2271.62 [oz_av]

## 2024-08-17 LAB — BLOOD CULTURE ID PANEL (REFLEXED) - BCID2

## 2024-08-17 LAB — COMPREHENSIVE METABOLIC PANEL WITH GFR
ALT: <5 U/L (ref 0–44)
AST: 73 U/L — ABNORMAL HIGH (ref 15–41)
Albumin: 3 g/dL — ABNORMAL LOW (ref 3.5–5.0)
Alkaline Phosphatase: 290 U/L — ABNORMAL HIGH (ref 38–126)
Anion gap: 14 (ref 5–15)
BUN: 24 mg/dL — ABNORMAL HIGH (ref 8–23)
CO2: 18 mmol/L — ABNORMAL LOW (ref 22–32)
Calcium: 8.1 mg/dL — ABNORMAL LOW (ref 8.9–10.3)
Chloride: 95 mmol/L — ABNORMAL LOW (ref 98–111)
Creatinine, Ser: 1.74 mg/dL — ABNORMAL HIGH (ref 0.44–1.00)
GFR, Estimated: 27 mL/min — ABNORMAL LOW
Glucose, Bld: 91 mg/dL (ref 70–99)
Potassium: 3.8 mmol/L (ref 3.5–5.1)
Sodium: 128 mmol/L — ABNORMAL LOW (ref 135–145)
Total Bilirubin: 0.3 mg/dL (ref 0.0–1.2)
Total Protein: 6.1 g/dL — ABNORMAL LOW (ref 6.5–8.1)

## 2024-08-17 LAB — HEPARIN LEVEL (UNFRACTIONATED)
Heparin Unfractionated: 0.29 [IU]/mL — ABNORMAL LOW (ref 0.30–0.70)
Heparin Unfractionated: 0.24 [IU]/mL — ABNORMAL LOW (ref 0.30–0.70)

## 2024-08-17 LAB — STREP PNEUMONIAE URINARY ANTIGEN: Strep Pneumo Urinary Antigen: NEGATIVE

## 2024-08-17 MED ORDER — SODIUM CHLORIDE 0.9 % IV SOLN
2.0000 g | INTRAVENOUS | Status: DC
  Administered 2024-08-17 – 2024-08-19 (×3): 2 g via INTRAVENOUS
  Filled 2024-08-17 (×3): qty 20

## 2024-08-17 MED ORDER — MELATONIN 5 MG PO TABS
5.0000 mg | ORAL_TABLET | Freq: Once | ORAL | Status: AC
  Administered 2024-08-17: 5 mg via ORAL
  Filled 2024-08-17: qty 1

## 2024-08-17 NOTE — Consult Note (Signed)
 "  Urology Consult  Requesting physician: Aida Cho, MD  Reason for consultation: UTI/pyelonephritis, sepsis and severe right hydronephrosis   Assessment/Recommendations: 89 y.o. female admitted with sepsis secondary to UTI/pyelonephritis, E. coli bacteremia and found to have severe right hydronephrosis with apparent obstruction at the UPJ not related to stone.  This finding was not present on a prior CT September 2024 but was felt to show mural thickening of the right renal pelvis She was seen ~24 hours post admission and has had marked clinical improvement I discussed management options of percutaneous nephrostomy placement and placement of a right ureteral stent.  We also discussed observation with her marked clinical improvement and intervention if there was clinical deterioration.  She was not sure at this time which way she wants to proceed.  She did asked that I contact her son Georgette Cork.  I also discussed the above with her son and he will talk to her this evening   History of Present Illness: DEBANHI BLAKER is a 89 y.o. who presented to the ED ~1300 on 08/16/2024 with complaints of a 3-day history of shortness of breath and tachycardia.  O2 saturation was 78% on room air and temp 99.6 with a stable blood pressure initially and subsequent hypotension.  Lactic acid was elevated at 4.6.  Admitted for UTI/possible pneumonia.  CT abdomen/pelvis performed after her admission showed new onset severe right hydronephrosis to the UPJ without evidence of stone.  I was not asked to see until this afternoon and she presently has no complaints and is feeling much better.  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 and most likely underwent a nephropexy  She denies recurrent UTI or chronic flank/abdominal pain  Past Medical History:  Diagnosis Date   Actinic keratosis    Basal cell carcinoma 02/05/2024   left mid calf, EDC    Cancer (HCC)    skin ca, right forearm   Glaucoma    Hyperlipemia    Macular degeneration    Osteoporosis    Psoriasis    Pulmonary hypertension (HCC)    SCC (squamous cell carcinoma) 03/25/2023   right anterior thigh, excision 04/09/23   Thyroid disease     Past Surgical History:  Procedure Laterality Date   BREAST BIOPSY Bilateral    neg   TONSILLECTOMY      Home Medications:  Active Medications[1]  Allergies: Allergies[2]  Family History  Problem Relation Age of Onset   Hyperlipidemia Mother    Coronary artery disease Mother    Breast cancer Neg Hx     Social History:  reports that she has never smoked. She has never used smokeless tobacco. She reports that she does not drink alcohol and does not use drugs.  ROS: A complete review of systems was performed.  All systems are negative except for pertinent findings as noted.  Physical Exam:  Vital signs in last 24 hours: Temp:  [97.6 F (36.4 C)-98.2 F (36.8 C)] 98.2 F (36.8 C) (01/20 1754) Pulse Rate:  [73-103] 103 (01/20 1754) Resp:  [15-22] 15 (01/20 0009) BP: (99-151)/(51-77) 151/51 (01/20 1754) SpO2:  [98 %-100 %] 99 % (01/20 1754) Weight:  [64.4 kg] 64.4 kg (01/20 0512) Constitutional:  Alert and oriented, No acute distress HEENT: [place] AT, moist mucus membranes Respiratory: Normal respiratory effort GI: Abdomen is soft, nontender, nondistended, no abdominal masses GU: No CVA tenderness Neurologic: Grossly intact, no focal deficits, moving  all 4 extremities Psychiatric: Normal mood and affect   Laboratory Data:  Recent Labs    08/16/24 1359 08/17/24 0332  WBC 17.9* 29.8*  HGB 8.9* 8.5*  HCT 27.9* 25.9*   Recent Labs    08/16/24 1359 08/17/24 0332  NA 130* 128*  K 4.0 3.8  CL 96* 95*  CO2 17* 18*  GLUCOSE 111* 91  BUN 18 24*  CREATININE 1.78* 1.74*  CALCIUM 8.5* 8.1*   Recent Labs    08/16/24 1359  INR 1.2   No results for input(s): LABURIN in the last 72 hours. Results for  orders placed or performed during the hospital encounter of 08/16/24  Resp panel by RT-PCR (RSV, Flu A&B, Covid) Anterior Nasal Swab     Status: None   Collection Time: 08/16/24  1:59 PM   Specimen: Anterior Nasal Swab  Result Value Ref Range Status   SARS Coronavirus 2 by RT PCR NEGATIVE NEGATIVE Final    Comment: (NOTE) SARS-CoV-2 target nucleic acids are NOT DETECTED.  The SARS-CoV-2 RNA is generally detectable in upper respiratory specimens during the acute phase of infection. The lowest concentration of SARS-CoV-2 viral copies this assay can detect is 138 copies/mL. A negative result does not preclude SARS-Cov-2 infection and should not be used as the sole basis for treatment or other patient management decisions. A negative result may occur with  improper specimen collection/handling, submission of specimen other than nasopharyngeal swab, presence of viral mutation(s) within the areas targeted by this assay, and inadequate number of viral copies(<138 copies/mL). A negative result must be combined with clinical observations, patient history, and epidemiological information. The expected result is Negative.  Fact Sheet for Patients:  bloggercourse.com  Fact Sheet for Healthcare Providers:  seriousbroker.it  This test is no t yet approved or cleared by the [place]  FDA and  has been authorized for detection and/or diagnosis of SARS-CoV-2 by FDA under an Emergency Use Authorization (EUA). This EUA will remain  in effect (meaning this test can be used) for the duration of the COVID-19 declaration under Section 564(b)(1) of the Act, 21 U.S.C.section 360bbb-3(b)(1), unless the authorization is terminated  or revoked sooner.       Influenza A by PCR NEGATIVE NEGATIVE Final   Influenza B by PCR NEGATIVE NEGATIVE Final    Comment: (NOTE) The Xpert Xpress SARS-CoV-2/FLU/RSV plus assay is intended as an aid in the diagnosis of  influenza from Nasopharyngeal swab specimens and should not be used as a sole basis for treatment. Nasal washings and aspirates are unacceptable for Xpert Xpress SARS-CoV-2/FLU/RSV testing.  Fact Sheet for Patients: bloggercourse.com  Fact Sheet for Healthcare Providers: seriousbroker.it  This test is not yet approved or cleared by the [place]  FDA and has been authorized for detection and/or diagnosis of SARS-CoV-2 by FDA under an Emergency Use Authorization (EUA). This EUA will remain in effect (meaning this test can be used) for the duration of the COVID-19 declaration under Section 564(b)(1) of the Act, 21 U.S.C. section 360bbb-3(b)(1), unless the authorization is terminated or revoked.     Resp Syncytial Virus by PCR NEGATIVE NEGATIVE Final    Comment: (NOTE) Fact Sheet for Patients: bloggercourse.com  Fact Sheet for Healthcare Providers: seriousbroker.it  This test is not yet approved or cleared by the [place]  FDA and has been authorized for detection and/or diagnosis of SARS-CoV-2 by FDA under an Emergency Use Authorization (EUA). This EUA will remain in effect (meaning this test can be used) for the duration of the  COVID-19 declaration under Section 564(b)(1) of the Act, 21 U.S.C. section 360bbb-3(b)(1), unless the authorization is terminated or revoked.  Performed at [facility], [address] [place], [place] [address]   Blood Culture (routine x 2)     Status: None (Preliminary result)   Collection Time: 08/16/24  2:32 PM   Specimen: BLOOD  Result Value Ref Range Status   Specimen Description   Final    BLOOD BLOOD RIGHT ARM Performed at [facility], [address] [place], [place] [address]    Special Requests   Final    BOTTLES DRAWN AEROBIC AND ANAEROBIC Blood Culture adequate volume Performed at [facility], [address] [place], [place] [address]    Culture  Setup Time   Final    GRAM NEGATIVE RODS IN BOTH AEROBIC AND ANAEROBIC BOTTLES CRITICAL RESULT CALLED TO, READ BACK BY AND VERIFIED WITH:  NATHAN BELUE AT 0035 08/17/24 JG Performed at [facility], [address], [place], [place] [address]    Culture GRAM NEGATIVE RODS  Final   Report Status PENDING  Incomplete  Blood Culture (routine x 2)     Status: None (Preliminary result)   Collection Time: 08/16/24  2:32 PM   Specimen: BLOOD  Result Value Ref Range Status   Specimen Description   Final    BLOOD BLOOD LEFT ARM Performed at [facility], [address], [place], [place] [address]    Special Requests   Final    BOTTLES DRAWN AEROBIC AND ANAEROBIC Blood Culture adequate volume Performed at [facility], [address] [place], [place] [address]    Culture  Setup Time   Final    GRAM NEGATIVE RODS IN BOTH AEROBIC AND ANAEROBIC BOTTLES CRITICAL VALUE NOTED.  VALUE IS CONSISTENT WITH PREVIOUSLY REPORTED AND CALLED VALUE. Performed at [facility], [address] [place], [place] [address]    Culture GRAM NEGATIVE RODS  Final   Report Status PENDING  Incomplete  Urine Culture     Status: Abnormal (Preliminary result)   Collection Time: 08/16/24  2:32 PM   Specimen: Urine, Catheterized  Result Value Ref Range Status   Specimen Description   Final    URINE, CATHETERIZED Performed at [facility], [address], [place], [place] [address]    Special Requests   Final    NONE Performed at [facility], [address], [place], [place] [address]    Culture (A)  Final    >=100,000 COLONIES/mL GRAM NEGATIVE RODS IDENTIFICATION AND SUSCEPTIBILITIES TO FOLLOW Performed at [facility], [address] [place], [place] [address]    Report Status PENDING  Incomplete  Blood Culture ID Panel (Reflexed)     Status: Abnormal   Collection Time: 08/16/24  2:32 PM  Result  Value Ref Range Status   Enterococcus faecalis NOT DETECTED NOT DETECTED Final   Enterococcus Faecium NOT DETECTED NOT DETECTED Final   Listeria monocytogenes NOT DETECTED NOT DETECTED Final   Staphylococcus species NOT DETECTED NOT DETECTED Final   Staphylococcus aureus (BCID) NOT DETECTED NOT DETECTED Final   Staphylococcus epidermidis NOT DETECTED NOT DETECTED Final   Staphylococcus lugdunensis NOT DETECTED NOT DETECTED Final   Streptococcus species NOT DETECTED NOT DETECTED Final   Streptococcus agalactiae NOT DETECTED NOT DETECTED Final   Streptococcus pneumoniae NOT DETECTED NOT DETECTED Final   Streptococcus pyogenes NOT DETECTED NOT DETECTED Final   A.calcoaceticus-baumannii NOT DETECTED NOT DETECTED Final  Bacteroides fragilis NOT DETECTED NOT DETECTED Final   Enterobacterales DETECTED (A) NOT DETECTED Final    Comment: Enterobacterales represent a large order of gram negative bacteria, not a single organism. CRITICAL RESULT CALLED TO, READ BACK BY AND VERIFIED WITH:  NATHAN BELUE AT 0035 08/17/24 JG    Enterobacter cloacae complex NOT DETECTED NOT DETECTED Final   Escherichia coli DETECTED (A) NOT DETECTED Final    Comment: CRITICAL RESULT CALLED TO, READ BACK BY AND VERIFIED WITH:  NATHAN BELUE AT 0035 08/17/24 JG    Klebsiella aerogenes NOT DETECTED NOT DETECTED Final   Klebsiella oxytoca NOT DETECTED NOT DETECTED Final   Klebsiella pneumoniae NOT DETECTED NOT DETECTED Final   Proteus species NOT DETECTED NOT DETECTED Final   Salmonella species NOT DETECTED NOT DETECTED Final   Serratia marcescens NOT DETECTED NOT DETECTED Final   Haemophilus influenzae NOT DETECTED NOT DETECTED Final   Neisseria meningitidis NOT DETECTED NOT DETECTED Final   Pseudomonas aeruginosa NOT DETECTED NOT DETECTED Final   Stenotrophomonas maltophilia NOT DETECTED NOT DETECTED Final   Candida albicans NOT DETECTED NOT DETECTED Final   Candida auris NOT DETECTED NOT DETECTED Final   Candida  glabrata NOT DETECTED NOT DETECTED Final   Candida krusei NOT DETECTED NOT DETECTED Final   Candida parapsilosis NOT DETECTED NOT DETECTED Final   Candida tropicalis NOT DETECTED NOT DETECTED Final   Cryptococcus neoformans/gattii NOT DETECTED NOT DETECTED Final   CTX-M ESBL NOT DETECTED NOT DETECTED Final   Carbapenem resistance IMP NOT DETECTED NOT DETECTED Final   Carbapenem resistance KPC NOT DETECTED NOT DETECTED Final   Carbapenem resistance NDM NOT DETECTED NOT DETECTED Final   Carbapenem resist OXA 48 LIKE NOT DETECTED NOT DETECTED Final   Carbapenem resistance VIM NOT DETECTED NOT DETECTED Final    Comment: Performed at [facility], [address], [place], [place] [address]     Radiologic Imaging: CT abdomen/pelvis was reviewed and interpreted  ECHOCARDIOGRAM COMPLETE Result Date: 08/17/2024    ECHOCARDIOGRAM REPORT   Patient Name:   JAKHIA BUXTON Date of Exam: 08/17/2024 Medical Rec #:  [id number]       Height:       63.0 in Accession #:    [id number]      Weight:       142.0 lb Date of Birth:  10/12/1931       BSA:          1.672 m Patient Age:    89 years        BP:           142/77 mmHg Patient Gender: F               HR:           79 bpm. Exam Location:  [facility] Procedure: 2D Echo, Cardiac Doppler and Color Doppler (Both Spectral and Color            Flow Doppler were utilized during procedure). Indications:     CHF  History:         Patient has no prior history of Echocardiogram examinations.                  Risk Factors:Hypertension and Dyslipidemia.  Sonographer:     Philomena Daring Referring Phys:  [id number] XZDYJA PAUDEL Diagnosing Phys: Cara JONETTA Lovelace MD IMPRESSIONS  1. Left ventricular ejection fraction, by estimation, is 70 to 75%. The left ventricle has hyperdynamic function. The left ventricle has no  regional wall motion abnormalities. There is mild concentric left ventricular hypertrophy. Left ventricular diastolic parameters are consistent with Grade I diastolic  dysfunction (impaired relaxation).  2. Right ventricular systolic function is normal. The right ventricular size is normal.  3. Left atrial size was mildly dilated.  4. Right atrial size was mildly dilated.  5. The mitral valve is grossly normal. Mild mitral valve regurgitation.  6. The aortic valve is calcified. Aortic valve regurgitation is moderate. Aortic valve sclerosis/calcification is present, without any evidence of aortic stenosis. FINDINGS  Left Ventricle: Left ventricular ejection fraction, by estimation, is 70 to 75%. The left ventricle has hyperdynamic function. The left ventricle has no regional wall motion abnormalities. Strain was performed and the global longitudinal strain is indeterminate. The left ventricular internal cavity size was normal in size. There is mild concentric left ventricular hypertrophy. Left ventricular diastolic parameters are consistent with Grade I diastolic dysfunction (impaired relaxation). Right Ventricle: The right ventricular size is normal. No increase in right ventricular wall thickness. Right ventricular systolic function is normal. Left Atrium: Left atrial size was mildly dilated. Right Atrium: Right atrial size was mildly dilated. Pericardium: There is no evidence of pericardial effusion. Mitral Valve: The mitral valve is grossly normal. Mild mitral valve regurgitation. Tricuspid Valve: The tricuspid valve is normal in structure. Tricuspid valve regurgitation is mild. Aortic Valve: The aortic valve is calcified. Aortic valve regurgitation is moderate. Aortic regurgitation PHT measures 549 msec. Aortic valve sclerosis/calcification is present, without any evidence of aortic stenosis. Aortic valve mean gradient measures  8.5 mmHg. Aortic valve peak gradient measures 15.6 mmHg. Aortic valve area, by VTI measures 1.75 cm. Pulmonic Valve: The pulmonic valve was not well visualized. Pulmonic valve regurgitation is not visualized. Aorta: The aortic root was not well  visualized. IAS/Shunts: No atrial level shunt detected by color flow Doppler. Additional Comments: 3D was performed not requiring image post processing on an independent workstation and was indeterminate.  LEFT VENTRICLE PLAX 2D LVIDd:         3.80 cm   Diastology LVIDs:         2.20 cm   LV e' medial:    6.31 cm/s LV PW:         1.20 cm   LV E/e' medial:  15.8 LV IVS:        1.30 cm   LV e' lateral:   7.72 cm/s LVOT diam:     1.90 cm   LV E/e' lateral: 13.0 LV SV:         63 LV SV Index:   38 LVOT Area:     2.84 cm  RIGHT VENTRICLE             IVC RV Basal diam:  3.10 cm     IVC diam: 1.90 cm RV Mid diam:    1.90 cm RV S prime:     10.40 cm/s TAPSE (M-mode): 1.8 cm LEFT ATRIUM             Index        RIGHT ATRIUM           Index LA diam:        3.70 cm 2.21 cm/m   RA Area:     13.70 cm LA Vol (A2C):   68.4 ml 40.92 ml/m  RA Volume:   27.70 ml  16.57 ml/m LA Vol (A4C):   53.9 ml 32.24 ml/m LA Biplane Vol: 60.6 ml 36.25 ml/m  AORTIC VALVE AV Area (  Vmax):    1.60 cm AV Area (Vmean):   1.53 cm AV Area (VTI):     1.75 cm AV Vmax:           197.25 cm/s AV Vmean:          136.750 cm/s AV VTI:            0.361 m AV Peak Grad:      15.6 mmHg AV Mean Grad:      8.5 mmHg LVOT Vmax:         111.00 cm/s LVOT Vmean:        73.900 cm/s LVOT VTI:          0.222 m LVOT/AV VTI ratio: 0.62 AI PHT:            549 msec  AORTA Ao Root diam: 2.70 cm MITRAL VALVE                TRICUSPID VALVE MV Area (PHT): 3.02 cm     TR Peak grad:   19.4 mmHg MV Decel Time: 251 msec     TR Vmax:        220.00 cm/s MV E velocity: 100.00 cm/s MV A velocity: 112.00 cm/s  SHUNTS MV E/A ratio:  0.89         Systemic VTI:  0.22 m                             Systemic Diam: 1.90 cm Cara JONETTA Lovelace MD Electronically signed by Cara JONETTA Lovelace MD Signature Date/Time: 08/17/2024/2:23:37 PM    Final    CT CHEST ABDOMEN PELVIS WO CONTRAST Result Date: 08/16/2024 CLINICAL DATA:  Sepsis right flank pain dyspnea on exertion EXAM: CT CHEST, ABDOMEN AND  PELVIS WITHOUT CONTRAST TECHNIQUE: Multidetector CT imaging of the chest, abdomen and pelvis was performed following the standard protocol without IV contrast. RADIATION DOSE REDUCTION: This exam was performed according to the departmental dose-optimization program which includes automated exposure control, adjustment of the mA and/or kV according to patient size and/or use of iterative reconstruction technique. COMPARISON:  Chest x-ray 08/16/2024, CT 04/15/2023, 08/11/2023 FINDINGS: CT CHEST FINDINGS Cardiovascular: Limited assessment without intravenous contrast. Moderate aortic atherosclerosis. No aneurysm. Aortic valvular calcification. Coronary vascular calcification. Cardiomegaly. No pericardial effusion Mediastinum/Nodes: Patent trachea. No thyroid mass. No suspicious lymph nodes. Esophagus within normal limits. Lungs/Pleura: Trace right effusion. Mild apical scarring. No consolidation, or pneumothorax. Musculoskeletal: No acute osseous abnormality.  Degenerative changes CT ABDOMEN PELVIS FINDINGS Hepatobiliary: No focal liver abnormality is seen. No gallstones, gallbladder wall thickening, or biliary dilatation. Pancreas: Atrophic. No acute inflammation. Numerous hypodensities throughout the pancreas as was seen on prior imaging. Reference right inferior pancreatic head low-density lesion measuring 20 mm on series 2, image 64, previously 19 mm. These have been present since 2015 Spleen: Normal in size without focal abnormality. Adrenals/Urinary Tract: Adrenal glands are normal. No hydronephrosis on the left. Interim development of moderate severe right hydronephrosis. Perinephric fat stranding on the right. No obstructing stone is seen. The bladder is unremarkable Stomach/Bowel: The stomach is decompressed. There is no dilated small bowel. No acute bowel wall thickening. Diverticular disease of the left colon Vascular/Lymphatic: Aortic atherosclerosis. No enlarged abdominal or pelvic lymph nodes.  Reproductive: Uterus and bilateral adnexa are unremarkable. Other: Negative for ascites or free air Musculoskeletal: Degenerative changes.  No acute osseous abnormality IMPRESSION: 1. Interim development of severe right hydronephrosis and perinephric fat stranding. No obstructing  stone is seen. Concern raised for obstructing process at the right UPJ. Fat stranding is nonspecific but superimposed upper UTI/pyelonephritis not excluded 2. Trace right effusion. 3. Cardiomegaly. 4. Numerous hypodensities/cystic density throughout the pancreas as was seen on prior imaging and present dating back to 2015. No specific imaging follow-up recommended given patient age. 5. Diverticular disease of the left colon without acute inflammatory process. 6. Aortic atherosclerosis. Aortic Atherosclerosis (ICD10-I70.0). Electronically Signed   By: Luke Bun M.D.   On: 08/16/2024 16:50   DG Chest Portable 1 View Result Date: 08/16/2024 CLINICAL DATA:  Hypoxia. EXAM: PORTABLE CHEST 1 VIEW COMPARISON:  04/15/2023. FINDINGS: Low lung volumes. Cardiomegaly. Chronic elevation of the right hemidiaphragm. Bilateral interstitial prominence, most pronounced at the lung bases, suspicious for pulmonary vascular congestion with possible mild interstitial pulmonary edema. Mild right basilar atelectasis. No pneumothorax. No acute osseous abnormality. IMPRESSION: 1. Low lung volumes with bilateral interstitial prominence most pronounced at the lung bases, suspicious for pulmonary vascular congestion with possible mild interstitial edema. 2. Cardiomegaly. 3. Chronic elevation of the right hemidiaphragm with mild right basilar atelectasis. Electronically Signed   By: Harrietta Sherry M.D.   On: 08/16/2024 15:20       08/17/2024, 6:09 PM  Glendia Barba,  MD        [1]  Current Meds  Medication Sig   albuterol  (VENTOLIN  HFA) 108 (90 Base) MCG/ACT inhaler Inhale 2 puffs into the lungs every 6 (six) hours as needed for wheezing or  shortness of breath.   aspirin  EC 81 MG tablet Take by mouth.   Calcium 600-400 MG-UNIT CHEW Chew by mouth.   cefUROXime (CEFTIN) 250 MG tablet Take 250 mg by mouth 2 (two) times daily with a meal.   Cholecalciferol  (VITAMIN D -1000 MAX ST) 25 MCG (1000 UT) tablet Take 1,000 Units by mouth daily.   Dextromethorphan -guaiFENesin  20-200 MG/20ML LIQD Take 10 mLs by mouth every 4 (four) hours as needed (chest congestion).   escitalopram  (LEXAPRO ) 10 MG tablet Take 1 tablet by mouth daily.   latanoprost  (XALATAN ) 0.005 % ophthalmic solution Place 1 drop into the right eye at bedtime.   levothyroxine  (SYNTHROID ) 100 MCG tablet Take 100 mcg by mouth daily.   losartan  (COZAAR ) 50 MG tablet Take 50 mg by mouth daily.   metoprolol  succinate (TOPROL -XL) 25 MG 24 hr tablet Take 25 mg by mouth daily.   Multiple Vitamins-Minerals (PRESERVISION AREDS 2+MULTI VIT PO) Take by mouth.   simvastatin  (ZOCOR ) 20 MG tablet Take 20 mg by mouth daily.   timolol  (TIMOPTIC ) 0.5 % ophthalmic solution Place 1 drop into both eyes daily.  [2]  Allergies Allergen Reactions   Methotrexate And Trimetrexate Shortness Of Breath   Prednisone  Other (See Comments)    Patient called and left message she could not take    Alendronate Diarrhea   "

## 2024-08-17 NOTE — Plan of Care (Signed)

## 2024-08-17 NOTE — Consult Note (Signed)
 Pharmacy Consult Note - Anticoagulation  Pharmacy Consult for heparin  infusion Indication: chest pain/ACS Allergies[1]  PATIENT MEASUREMENTS: Height: 5' 3 (160 cm) Weight: 64 kg (141 lb) IBW/kg (Calculated) : 52.4 HEPARIN  DW (KG): 64  VITAL SIGNS: Temp: 97.7 F (36.5 C) (01/20 0009) Temp Source: Oral (01/20 0009) BP: 117/57 (01/20 0009) Pulse Rate: 78 (01/20 0009)  Recent Labs    08/16/24 1359 08/16/24 1600 08/17/24 0030  HGB 8.9*  --   --   HCT 27.9*  --   --   PLT 279  --   --   APTT  --  50*  --   LABPROT 15.8*  --   --   INR 1.2  --   --   HEPARINUNFRC  --   --  0.29*  CREATININE 1.78*  --   --     Estimated Creatinine Clearance: 18.9 mL/min (A) (by C-G formula based on SCr of 1.78 mg/dL (H)).  PAST MEDICAL HISTORY: Past Medical History:  Diagnosis Date   Actinic keratosis    Basal cell carcinoma 02/05/2024   left mid calf, EDC   Cancer (HCC)    skin ca, right forearm   Glaucoma    Hyperlipemia    Macular degeneration    Osteoporosis    Psoriasis    Pulmonary hypertension (HCC)    SCC (squamous cell carcinoma) 03/25/2023   right anterior thigh, excision 04/09/23   Thyroid disease     Medications:  Medications Prior to Admission  Medication Sig Dispense Refill Last Dose/Taking   albuterol  (VENTOLIN  HFA) 108 (90 Base) MCG/ACT inhaler Inhale 2 puffs into the lungs every 6 (six) hours as needed for wheezing or shortness of breath. 6.7 g 2 Unknown   aspirin  EC 81 MG tablet Take by mouth.   08/15/2024 at  5:00 PM   Calcium 600-400 MG-UNIT CHEW Chew by mouth.   Unknown   cefUROXime (CEFTIN) 250 MG tablet Take 250 mg by mouth 2 (two) times daily with a meal.   Taking   Cholecalciferol  (VITAMIN D -1000 MAX ST) 25 MCG (1000 UT) tablet Take 1,000 Units by mouth daily.   08/15/2024 at  5:00 PM   Dextromethorphan -guaiFENesin  20-200 MG/20ML LIQD Take 10 mLs by mouth every 4 (four) hours as needed (chest congestion). 236 mL 0 Unknown   escitalopram  (LEXAPRO ) 10 MG  tablet Take 1 tablet by mouth daily.   08/15/2024 at  5:00 PM   latanoprost  (XALATAN ) 0.005 % ophthalmic solution Place 1 drop into the right eye at bedtime.   08/15/2024 Bedtime   levothyroxine  (SYNTHROID ) 100 MCG tablet Take 100 mcg by mouth daily.   08/16/2024 Morning   losartan  (COZAAR ) 50 MG tablet Take 50 mg by mouth daily.   08/15/2024 at  5:00 PM   metoprolol  succinate (TOPROL -XL) 25 MG 24 hr tablet Take 25 mg by mouth daily.   08/15/2024 at  5:00 PM   Multiple Vitamins-Minerals (PRESERVISION AREDS 2+MULTI VIT PO) Take by mouth.   08/15/2024 at  5:00 PM   simvastatin  (ZOCOR ) 20 MG tablet Take 20 mg by mouth daily.   08/15/2024 at  5:00 PM   timolol  (TIMOPTIC ) 0.5 % ophthalmic solution Place 1 drop into both eyes daily.   Past Week   clobetasol  cream (TEMOVATE ) 0.05 % Spot treat affected areas psoriasis once to twice daily until improved. Avoid face, groin, axilla. (Patient not taking: Reported on 08/16/2024) 60 g 2 Not Taking   Multiple Vitamin (MULTIVITAMIN) tablet Take 1 tablet by mouth daily.  mupirocin  ointment (BACTROBAN ) 2 % APPLY ONCE DAILY WITH BANDAGE CHANGE. (Patient not taking: Reported on 08/16/2024) 22 g 0 Not Taking   Scheduled:   aspirin  EC  81 mg Oral Daily   azithromycin   500 mg Oral Daily   escitalopram   10 mg Oral Daily   levothyroxine   100 mcg Oral Q0600   losartan   50 mg Oral Daily   metoprolol  succinate  25 mg Oral Daily   simvastatin   20 mg Oral Daily   Infusions:   cefTRIAXone  (ROCEPHIN )  IV 2 g (08/16/24 2207)   heparin  750 Units/hr (08/16/24 1739)   PRN: acetaminophen  **OR** acetaminophen , fentaNYL  (SUBLIMAZE ) injection, ipratropium-albuterol , ondansetron  **OR** ondansetron  (ZOFRAN ) IV, oxyCODONE , polyethylene glycol Anti-infectives (From admission, onward)    Start     Dose/Rate Route Frequency Ordered Stop   08/16/24 2200  cefTRIAXone  (ROCEPHIN ) 2 g in sodium chloride  0.9 % 100 mL IVPB        2 g 200 mL/hr over 30 Minutes Intravenous Every 24 hours  08/16/24 1551 08/21/24 2159   08/16/24 1700  azithromycin  (ZITHROMAX ) tablet 500 mg        500 mg Oral Daily 08/16/24 1654 08/21/24 0959   08/16/24 1600  vancomycin  (VANCOREADY) IVPB 1250 mg/250 mL        1,250 mg 166.7 mL/hr over 90 Minutes Intravenous  Once 08/16/24 1517 08/16/24 1812   08/16/24 1600  azithromycin  (ZITHROMAX ) 500 mg in sodium chloride  0.9 % 250 mL IVPB  Status:  Discontinued        500 mg 250 mL/hr over 60 Minutes Intravenous Every 24 hours 08/16/24 1551 08/16/24 1654   08/16/24 1515  piperacillin -tazobactam (ZOSYN ) IVPB 3.375 g        3.375 g 100 mL/hr over 30 Minutes Intravenous  Once 08/16/24 1511 08/16/24 1606       ASSESSMENT: 89 y.o. female with PMH A.fib, HTN, HLD is presenting with SOB abd chest pain . Patient is not on chronic anticoagulation per chart review. Pharmacy has been consulted to initiate and manage heparin  intravenous infusion.   Goal(s) of therapy: Heparin  level 0.3 - 0.7 units/mL aPTT 66 - 102 seconds Monitor platelets by anticoagulation protocol: Yes   Baseline anticoagulation labs: Recent Labs    08/16/24 1359 08/16/24 1600  APTT  --  50*  INR 1.2  --   HGB 8.9*  --   PLT 279  --     01/20 0030 HL 0.29, slightly subtherapeutic  PLAN: Increase heparin  infusion to 850 units/hour. Recheck heparin  level in 8 hours after rate change Monitor CBC daily while on heparin  infusion.  Rankin CANDIE Dills, PharmD, [facility] 08/17/2024 1:46 AM      [1]  Allergies Allergen Reactions   Methotrexate And Trimetrexate Shortness Of Breath   Prednisone  Other (See Comments)    Patient called and left message she could not take    Alendronate Diarrhea

## 2024-08-17 NOTE — Progress Notes (Signed)
 PHARMACY - PHYSICIAN COMMUNICATION CRITICAL VALUE ALERT - BLOOD CULTURE IDENTIFICATION (BCID)  Results for orders placed or performed during the hospital encounter of 08/16/24  Resp panel by RT-PCR (RSV, Flu A&B, Covid) Anterior Nasal Swab     Status: None   Collection Time: 08/16/24  1:59 PM   Specimen: Anterior Nasal Swab  Result Value Ref Range Status   SARS Coronavirus 2 by RT PCR NEGATIVE NEGATIVE Final    Comment: (NOTE) SARS-CoV-2 target nucleic acids are NOT DETECTED.  The SARS-CoV-2 RNA is generally detectable in upper respiratory specimens during the acute phase of infection. The lowest concentration of SARS-CoV-2 viral copies this assay can detect is 138 copies/mL. A negative result does not preclude SARS-Cov-2 infection and should not be used as the sole basis for treatment or other patient management decisions. A negative result may occur with  improper specimen collection/handling, submission of specimen other than nasopharyngeal swab, presence of viral mutation(s) within the areas targeted by this assay, and inadequate number of viral copies(<138 copies/mL). A negative result must be combined with clinical observations, patient history, and epidemiological information. The expected result is Negative.  Fact Sheet for Patients:  bloggercourse.com  Fact Sheet for Healthcare Providers:  seriousbroker.it  This test is no t yet approved or cleared by the [place]  FDA and  has been authorized for detection and/or diagnosis of SARS-CoV-2 by FDA under an Emergency Use Authorization (EUA). This EUA will remain  in effect (meaning this test can be used) for the duration of the COVID-19 declaration under Section 564(b)(1) of the Act, 21 U.S.C.section 360bbb-3(b)(1), unless the authorization is terminated  or revoked sooner.       Influenza A by PCR NEGATIVE NEGATIVE Final   Influenza B by PCR NEGATIVE NEGATIVE Final     Comment: (NOTE) The Xpert Xpress SARS-CoV-2/FLU/RSV plus assay is intended as an aid in the diagnosis of influenza from Nasopharyngeal swab specimens and should not be used as a sole basis for treatment. Nasal washings and aspirates are unacceptable for Xpert Xpress SARS-CoV-2/FLU/RSV testing.  Fact Sheet for Patients: bloggercourse.com  Fact Sheet for Healthcare Providers: seriousbroker.it  This test is not yet approved or cleared by the [place]  FDA and has been authorized for detection and/or diagnosis of SARS-CoV-2 by FDA under an Emergency Use Authorization (EUA). This EUA will remain in effect (meaning this test can be used) for the duration of the COVID-19 declaration under Section 564(b)(1) of the Act, 21 U.S.C. section 360bbb-3(b)(1), unless the authorization is terminated or revoked.     Resp Syncytial Virus by PCR NEGATIVE NEGATIVE Final    Comment: (NOTE) Fact Sheet for Patients: bloggercourse.com  Fact Sheet for Healthcare Providers: seriousbroker.it  This test is not yet approved or cleared by the [place]  FDA and has been authorized for detection and/or diagnosis of SARS-CoV-2 by FDA under an Emergency Use Authorization (EUA). This EUA will remain in effect (meaning this test can be used) for the duration of the COVID-19 declaration under Section 564(b)(1) of the Act, 21 U.S.C. section 360bbb-3(b)(1), unless the authorization is terminated or revoked.  Performed at [facility], [address] [place], [place] [address]   Blood Culture (routine x 2)     Status: None (Preliminary result)   Collection Time: 08/16/24  2:32 PM   Specimen: BLOOD  Result Value Ref Range Status   Specimen Description BLOOD BLOOD RIGHT ARM  Final   Special Requests   Final    BOTTLES DRAWN AEROBIC AND  ANAEROBIC Blood Culture adequate volume   Culture  Setup  Time   Final    GRAM NEGATIVE RODS IN BOTH AEROBIC AND ANAEROBIC BOTTLES Organism ID to follow CRITICAL RESULT CALLED TO, READ BACK BY AND VERIFIED WITHBETHA RANKIN DILLS AT 9964 08/17/24 JG Performed at [facility], [address] [place], [place] [address]    Culture PENDING  Incomplete   Report Status PENDING  Incomplete  Blood Culture (routine x 2)     Status: None (Preliminary result)   Collection Time: 08/16/24  2:32 PM   Specimen: BLOOD  Result Value Ref Range Status   Specimen Description BLOOD BLOOD LEFT ARM  Final   Special Requests   Final    BOTTLES DRAWN AEROBIC AND ANAEROBIC Blood Culture adequate volume   Culture  Setup Time   Final    GRAM NEGATIVE RODS IN BOTH AEROBIC AND ANAEROBIC BOTTLES CRITICAL VALUE NOTED.  VALUE IS CONSISTENT WITH PREVIOUSLY REPORTED AND CALLED VALUE. Performed at [facility], [address] [place], [place] [address]    Culture PENDING  Incomplete   Report Status PENDING  Incomplete  Blood Culture ID Panel (Reflexed)     Status: Abnormal   Collection Time: 08/16/24  2:32 PM  Result Value Ref Range Status   Enterococcus faecalis NOT DETECTED NOT DETECTED Final   Enterococcus Faecium NOT DETECTED NOT DETECTED Final   Listeria monocytogenes NOT DETECTED NOT DETECTED Final   Staphylococcus species NOT DETECTED NOT DETECTED Final   Staphylococcus aureus (BCID) NOT DETECTED NOT DETECTED Final   Staphylococcus epidermidis NOT DETECTED NOT DETECTED Final   Staphylococcus lugdunensis NOT DETECTED NOT DETECTED Final   Streptococcus species NOT DETECTED NOT DETECTED Final   Streptococcus agalactiae NOT DETECTED NOT DETECTED Final   Streptococcus pneumoniae NOT DETECTED NOT DETECTED Final   Streptococcus pyogenes NOT DETECTED NOT DETECTED Final   A.calcoaceticus-baumannii NOT DETECTED NOT DETECTED Final   Bacteroides fragilis NOT DETECTED NOT DETECTED Final   Enterobacterales DETECTED (A) NOT DETECTED Final    Comment:  Enterobacterales represent a large order of gram negative bacteria, not a single organism. CRITICAL RESULT CALLED TO, READ BACK BY AND VERIFIED WITH:  Zaydn Gutridge AT 0035 08/17/24 JG    Enterobacter cloacae complex NOT DETECTED NOT DETECTED Final   Escherichia coli DETECTED (A) NOT DETECTED Final    Comment: CRITICAL RESULT CALLED TO, READ BACK BY AND VERIFIED WITH:  Tamir Wallman AT 0035 08/17/24 JG    Klebsiella aerogenes NOT DETECTED NOT DETECTED Final   Klebsiella oxytoca NOT DETECTED NOT DETECTED Final   Klebsiella pneumoniae NOT DETECTED NOT DETECTED Final   Proteus species NOT DETECTED NOT DETECTED Final   Salmonella species NOT DETECTED NOT DETECTED Final   Serratia marcescens NOT DETECTED NOT DETECTED Final   Haemophilus influenzae NOT DETECTED NOT DETECTED Final   Neisseria meningitidis NOT DETECTED NOT DETECTED Final   Pseudomonas aeruginosa NOT DETECTED NOT DETECTED Final   Stenotrophomonas maltophilia NOT DETECTED NOT DETECTED Final   Candida albicans NOT DETECTED NOT DETECTED Final   Candida auris NOT DETECTED NOT DETECTED Final   Candida glabrata NOT DETECTED NOT DETECTED Final   Candida krusei NOT DETECTED NOT DETECTED Final   Candida parapsilosis NOT DETECTED NOT DETECTED Final   Candida tropicalis NOT DETECTED NOT DETECTED Final   Cryptococcus neoformans/gattii NOT DETECTED NOT DETECTED Final   CTX-M ESBL NOT DETECTED NOT DETECTED Final   Carbapenem resistance IMP NOT DETECTED NOT DETECTED Final   Carbapenem resistance KPC  NOT DETECTED NOT DETECTED Final   Carbapenem resistance NDM NOT DETECTED NOT DETECTED Final   Carbapenem resist OXA 48 LIKE NOT DETECTED NOT DETECTED Final   Carbapenem resistance VIM NOT DETECTED NOT DETECTED Final    Comment: Performed at [facility], [address], [place], [place] [address]    BCID Results: 4 of 4 vials w/ E coli, no resistance.  Pt already on Ceftriaxone  2 gm daily and PO azithromycin  for CAP.  Name of provider  contacted: LOIS Rams, NP   Changes to prescribed antibiotics required: No changes at this time.  Rankin CANDIE Dills, PharmD, MBA 08/17/2024 1:08 AM

## 2024-08-17 NOTE — Evaluation (Signed)
 Occupational Therapy Evaluation Patient Details Name: Dawn Dickerson MRN: [id number] DOB: 1931-12-29 Today's Date: 08/17/2024   History of Present Illness   Pt is a pleasant 89 y.o. female with medical history significant for HTN, HLD, macular degeneration, and pulmonary HTN who came in to [facility] ED from home for progressive worsening shortness of breath. MD assessment includes: severe sepsis secondary to acute UTI and E. coli bacteremia, acute on chronic diastolic CHF, severe right hydronephrosis, AKI, elevated troponins likely due to demand ischemia, and hyponatremia.     Clinical Impressions Pt was seen for OT evaluation this date. PTA, pt resides at home alone and her son lives nearby and drives her to appts. She is typically indep with household mobility and ADLs without AD use, reports intermittent use of rollator for limited community distances. Reports 1 recent fall. Pt presents with deficits in strength and activity tolerance limiting their ability to perform ADL management at baseline level. Pt currently requires supervision for STS from recliner and comfort height toilet during session. SBA for LB dressing tasks to donn mesh underwear. SBA for all aspects of toileting during session. Pt with mild DOE/SOB during session with HR up to 115 max and sp02 92-96% on RA throughout session. Edu on ECS/pacing/task modification to prevent overexertion as well as BLE/BUE ROM HEP while seated in recliner. Will follow acutely to maximize return to PLOF and recommend follow up OT services upon DC. Tub transfer bench recommended for use in tub/shower for energy conservation and safety.     If plan is discharge home, recommend the following:   A little help with walking and/or transfers;A little help with bathing/dressing/bathroom;Assistance with cooking/housework;Help with stairs or ramp for entrance     Functional Status Assessment   Patient has had a recent decline in their functional status and  demonstrates the ability to make significant improvements in function in a reasonable and predictable amount of time.     Equipment Recommendations   Tub/shower bench     Recommendations for Other Services         Precautions/Restrictions   Precautions Precautions: Fall Recall of Precautions/Restrictions: Intact Restrictions Weight Bearing Restrictions Per Provider Order: No     Mobility Bed Mobility               General bed mobility comments: NT, pt in sitting pre/post session    Transfers Overall transfer level: Needs assistance Equipment used: Rolling walker (2 wheels) Transfers: Sit to/from Stand Sit to Stand: Supervision           General transfer comment: stands from recliner and comfort height toilet with good stability, no physical assist required; ADL transfer ~20 ft to bathroom and back using RW + CGA/SBA      Balance Overall balance assessment: Needs assistance   Sitting balance-Leahy Scale: Normal     Standing balance support: Bilateral upper extremity supported, During functional activity Standing balance-Leahy Scale: Good Standing balance comment: no LOB noted while ambulating within the room using RW                           ADL either performed or assessed with clinical judgement   ADL Overall ADL's : Needs assistance/impaired                     Lower Body Dressing: Contact guard assist;Sit to/from stand;Sitting/lateral leans Lower Body Dressing Details (indicate cue type and reason): to donn mesh underwear Toilet Transfer:  Contact guard assist;Comfort height toilet;Rolling walker (2 wheels) Toilet Transfer Details (indicate cue type and reason): BSC over toilet using RW Toileting- Clothing Manipulation and Hygiene: Supervision/safety;Sit to/from stand Toileting - Clothing Manipulation Details (indicate cue type and reason): after cont void on comfort height toilet     Functional mobility during ADLs:  Contact guard assist;Rolling walker (2 wheels)       Vision         Perception         Praxis         Pertinent Vitals/Pain Pain Assessment Pain Assessment: No/denies pain     Extremity/Trunk Assessment Upper Extremity Assessment Upper Extremity Assessment: Generalized weakness;Overall WFL for tasks assessed   Lower Extremity Assessment Lower Extremity Assessment: Generalized weakness       Communication Communication Communication: No apparent difficulties   Cognition Arousal: Alert Behavior During Therapy: WFL for tasks assessed/performed                                 Following commands: Intact       Cueing  General Comments   Cueing Techniques: Verbal cues  fatigues easily with minimal activity; HR 115 upon initial stand with improvement to 90s most of session and sp02 92-96% on RA throughout   Exercises Other Exercises Other Exercises: Edu on role of OT in acute setting, task modification to maximize safety and prevent overexertion during ADL performance.   Shoulder Instructions      Home Living Family/patient expects to be discharged to:: Private residence Living Arrangements: Alone Available Help at Discharge: Family;Available 24 hours/day Type of Home: House Home Access: Stairs to enter [employer] of Steps: 4 Entrance Stairs-Rails: Left Home Layout: One level     Bathroom Shower/Tub: Tub/shower unit         Home Equipment: Grab bars - tub/shower;Standard Walker;Rollator (4 wheels)   Additional Comments: Per patient, may discharge to son's home initially with 3 steps to enter and no rails      Prior Functioning/Environment Prior Level of Function : History of Falls (last six months)             Mobility Comments: Ind amb mostly without an AD in the home, limited community ambulation with PRN rollator use, one fall in the last 6 months due to tripping ADLs Comments: Son assists with driving, Ind with  ADLs    OT Problem List: Decreased strength;Decreased activity tolerance   OT Treatment/Interventions: Self-care/ADL training;Therapeutic exercise;Patient/family education;Energy conservation;Therapeutic activities;DME and/or AE instruction      OT Goals(Current goals can be found in the care plan section)   Acute Rehab OT Goals Patient Stated Goal: improve endurance OT Goal Formulation: With patient Time For Goal Achievement: 08/31/24 Potential to Achieve Goals: Good ADL Goals Pt Will Perform Grooming: with modified independence;with supervision;standing Pt Will Perform Lower Body Dressing: with modified independence;sitting/lateral leans;sit to/from stand Pt Will Transfer to Toilet: with modified independence;ambulating Pt/caregiver will Perform Home Exercise Program: Increased strength;Both right and left upper extremity;With theraband;Independently;With written HEP provided Additional ADL Goal #1: Pt will demo implementation of 1 learned ECS during ADL performance to maximize safety/indep and prevent overexertion 2/2 trials..   OT Frequency:  Min 2X/week    Co-evaluation              AM-PAC OT 6 Clicks Daily Activity     Outcome Measure Help from another person eating meals?: None Help from another person  taking care of personal grooming?: None Help from another person toileting, which includes using toliet, bedpan, or urinal?: A Little Help from another person bathing (including washing, rinsing, drying)?: A Little Help from another person to put on and taking off regular upper body clothing?: A Little Help from another person to put on and taking off regular lower body clothing?: A Little 6 Click Score: 20   End of Session Equipment Utilized During Treatment: Rolling walker (2 wheels) Nurse Communication: Mobility status  Activity Tolerance: Patient tolerated treatment well Patient left: in chair;with call bell/phone within reach;with chair alarm set  OT Visit  Diagnosis: Other abnormalities of gait and mobility (R26.89);Muscle weakness (generalized) (M62.81)                Time: 8389-8368 OT Time Calculation (min): 21 min Charges:  OT General Charges $OT Visit: 1 Visit OT Evaluation $OT Eval Low Complexity: 1 Low Berenis Corter Chrismon, OTR/L 08/17/24, 4:54 PM  Daquavion Catala E Chrismon 08/17/2024, 4:50 PM

## 2024-08-17 NOTE — Consult Note (Addendum)
 "  CARDIOLOGY CONSULT NOTE               Patient ID: Dawn Dickerson MRN: [id number] DOB/AGE: 1932-02-23 89 y.o.  Admit date: 08/16/2024 Referring Physician Dr Nena Rebel hospitalist Primary Physician Dr. Ophelia Sage primary Primary Cardiologist  Reason for Consultation shortness of breath elevated troponins  HPI: Patient is a 3-year-old female presenting with hyperlipidemia hypertension complaining of shortness of breath progressive over the last 3 days complaining of heart rate racing but she cannot catch her breath patient family called rescue and found sats to be in the 70s she was placed on high flow oxygen which improved for oxygen saturation patient had chest x-ray which suggested possible pneumonia BNP was elevated to 35,000 no significant fever she was also slightly hypertensive there are some questionable A-fib on telemetry.  Patient denies any significant chest pain or lower extremity edema no previous cardiac history  Review of systems complete and found to be negative unless listed above     Past Medical History:  Diagnosis Date   Actinic keratosis    Basal cell carcinoma 02/05/2024   left mid calf, EDC   Cancer (HCC)    skin ca, right forearm   Glaucoma    Hyperlipemia    Macular degeneration    Osteoporosis    Psoriasis    Pulmonary hypertension (HCC)    SCC (squamous cell carcinoma) 03/25/2023   right anterior thigh, excision 04/09/23   Thyroid disease     Past Surgical History:  Procedure Laterality Date   BREAST BIOPSY Bilateral    neg   TONSILLECTOMY      Medications Prior to Admission  Medication Sig Dispense Refill Last Dose/Taking   albuterol  (VENTOLIN  HFA) 108 (90 Base) MCG/ACT inhaler Inhale 2 puffs into the lungs every 6 (six) hours as needed for wheezing or shortness of breath. 6.7 g 2 Unknown   aspirin  EC 81 MG tablet Take by mouth.   08/15/2024 at  5:00 PM   Calcium 600-400 MG-UNIT CHEW Chew by mouth.   Unknown   cefUROXime (CEFTIN) 250  MG tablet Take 250 mg by mouth 2 (two) times daily with a meal.   Taking   Cholecalciferol  (VITAMIN D -1000 MAX ST) 25 MCG (1000 UT) tablet Take 1,000 Units by mouth daily.   08/15/2024 at  5:00 PM   Dextromethorphan -guaiFENesin  20-200 MG/20ML LIQD Take 10 mLs by mouth every 4 (four) hours as needed (chest congestion). 236 mL 0 Unknown   escitalopram  (LEXAPRO ) 10 MG tablet Take 1 tablet by mouth daily.   08/15/2024 at  5:00 PM   latanoprost  (XALATAN ) 0.005 % ophthalmic solution Place 1 drop into the right eye at bedtime.   08/15/2024 Bedtime   levothyroxine  (SYNTHROID ) 100 MCG tablet Take 100 mcg by mouth daily.   08/16/2024 Morning   losartan  (COZAAR ) 50 MG tablet Take 50 mg by mouth daily.   08/15/2024 at  5:00 PM   metoprolol  succinate (TOPROL -XL) 25 MG 24 hr tablet Take 25 mg by mouth daily.   08/15/2024 at  5:00 PM   Multiple Vitamins-Minerals (PRESERVISION AREDS 2+MULTI VIT PO) Take by mouth.   08/15/2024 at  5:00 PM   simvastatin  (ZOCOR ) 20 MG tablet Take 20 mg by mouth daily.   08/15/2024 at  5:00 PM   timolol  (TIMOPTIC ) 0.5 % ophthalmic solution Place 1 drop into both eyes daily.   Past Week   clobetasol  cream (TEMOVATE ) 0.05 % Spot treat affected areas psoriasis once to twice daily until  improved. Avoid face, groin, axilla. (Patient not taking: Reported on 08/16/2024) 60 g 2 Not Taking   Multiple Vitamin (MULTIVITAMIN) tablet Take 1 tablet by mouth daily.      mupirocin  ointment (BACTROBAN ) 2 % APPLY ONCE DAILY WITH BANDAGE CHANGE. (Patient not taking: Reported on 08/16/2024) 22 g 0 Not Taking   Social History   Socioeconomic History   Marital status: Widowed    Spouse name: Not on file   Number of children: Not on file   Years of education: Not on file   Highest education level: Not on file  Occupational History   Not on file  Tobacco Use   Smoking status: Never   Smokeless tobacco: Never  Substance and Sexual Activity   Alcohol use: No   Drug use: No   Sexual activity: Not on file   Other Topics Concern   Not on file  Social History Narrative   Not on file   Social Drivers of Health   Tobacco Use: Low Risk (08/16/2024)   Patient History    Smoking Tobacco Use: Never    Smokeless Tobacco Use: Never    Passive Exposure: Not on file  Financial Resource Strain: Low Risk  (08/12/2024)   Received from [facility]   Overall Financial Resource Strain (CARDIA)    Difficulty of Paying Living Expenses: Not hard at all  Food Insecurity: No Food Insecurity (08/16/2024)   Epic    Worried About Radiation Protection Practitioner of Food in the Last Year: Never true    Ran Out of Food in the Last Year: Never true  Transportation Needs: No Transportation Needs (08/16/2024)   Epic    Lack of Transportation (Medical): No    Lack of Transportation (Non-Medical): No  Physical Activity: Not on file  Stress: Not on file  Social Connections: Unknown (08/16/2024)   Social Connection and Isolation Panel    Frequency of Communication with Friends and Family: Three times a week    Frequency of Social Gatherings with Friends and Family: Three times a week    Attends Religious Services: Not on file    Active Member of Clubs or Organizations: Not on file    Attends Club or Organization Meetings: Not on file    Marital Status: Not on file  Intimate Partner Violence: Unknown (08/16/2024)   Epic    Fear of Current or Ex-Partner: No    Emotionally Abused: No    Physically Abused: Not on file    Sexually Abused: Not on file  Depression (EYV7-0): Not on file  Alcohol Screen: Not on file  Housing: Low Risk (08/16/2024)   Epic    Unable to Pay for Housing in the Last Year: No    Number of Times Moved in the Last Year: 0    Homeless in the Last Year: No  Utilities: Not At Risk (08/16/2024)   Epic    Threatened with loss of utilities: No  Health Literacy: Not on file    Family History  Problem Relation Age of Onset   Hyperlipidemia Mother    Coronary artery disease Mother    Breast cancer  Neg Hx       Review of systems complete and found to be negative unless listed above      PHYSICAL EXAM  General: Well developed, well nourished, in no acute distress HEENT:  Normocephalic and atramatic Neck:  No JVD.  Lungs: Diminished bilaterally to auscultation and percussion. Heart: Tachycardic regular. Normal S1 and S2 without  gallops or murmurs.  Abdomen: Bowel sounds are positive, abdomen soft and non-tender  Msk:  Back normal, normal gait. Normal strength and tone for age. Extremities: No clubbing, cyanosis or edema.   Neuro: Alert and oriented X 3. Psych:  Good affect, responds appropriately  Labs:   Lab Results  Component Value Date   WBC 29.8 (H) 08/17/2024   HGB 8.5 (L) 08/17/2024   HCT 25.9 (L) 08/17/2024   MCV 81.2 08/17/2024   PLT 274 08/17/2024    Recent Labs  Lab 08/17/24 0332  NA 128*  K 3.8  CL 95*  CO2 18*  BUN 24*  CREATININE 1.74*  CALCIUM 8.1*  PROT 6.1*  BILITOT 0.3  ALKPHOS 290*  ALT <5  AST 73*  GLUCOSE 91   No results found for: CKTOTAL, CKMB, CKMBINDEX, TROPONINI No results found for: CHOL No results found for: HDL No results found for: LDLCALC No results found for: TRIG No results found for: CHOLHDL No results found for: LDLDIRECT    Radiology: ECHOCARDIOGRAM COMPLETE Result Date: 08/17/2024    ECHOCARDIOGRAM REPORT   Patient Name:   CIARA KAGAN Date of Exam: 08/17/2024 Medical Rec #:  [id number]       Height:       63.0 in Accession #:    [id number]      Weight:       142.0 lb Date of Birth:  June 14, 1932       BSA:          1.672 m Patient Age:    89 years        BP:           142/77 mmHg Patient Gender: F               HR:           79 bpm. Exam Location:  [facility] Procedure: 2D Echo, Cardiac Doppler and Color Doppler (Both Spectral and Color            Flow Doppler were utilized during procedure). Indications:     CHF  History:         Patient has no prior history of Echocardiogram examinations.                   Risk Factors:Hypertension and Dyslipidemia.  Sonographer:     Philomena Daring Referring Phys:  [id number] XZDYJA PAUDEL Diagnosing Phys: Cara JONETTA Lovelace MD IMPRESSIONS  1. Left ventricular ejection fraction, by estimation, is 70 to 75%. The left ventricle has hyperdynamic function. The left ventricle has no regional wall motion abnormalities. There is mild concentric left ventricular hypertrophy. Left ventricular diastolic parameters are consistent with Grade I diastolic dysfunction (impaired relaxation).  2. Right ventricular systolic function is normal. The right ventricular size is normal.  3. Left atrial size was mildly dilated.  4. Right atrial size was mildly dilated.  5. The mitral valve is grossly normal. Mild mitral valve regurgitation.  6. The aortic valve is calcified. Aortic valve regurgitation is moderate. Aortic valve sclerosis/calcification is present, without any evidence of aortic stenosis. FINDINGS  Left Ventricle: Left ventricular ejection fraction, by estimation, is 70 to 75%. The left ventricle has hyperdynamic function. The left ventricle has no regional wall motion abnormalities. Strain was performed and the global longitudinal strain is indeterminate. The left ventricular internal cavity size was normal in size. There is mild concentric left ventricular hypertrophy. Left ventricular diastolic parameters are consistent with Grade I diastolic dysfunction (impaired relaxation).  Right Ventricle: The right ventricular size is normal. No increase in right ventricular wall thickness. Right ventricular systolic function is normal. Left Atrium: Left atrial size was mildly dilated. Right Atrium: Right atrial size was mildly dilated. Pericardium: There is no evidence of pericardial effusion. Mitral Valve: The mitral valve is grossly normal. Mild mitral valve regurgitation. Tricuspid Valve: The tricuspid valve is normal in structure. Tricuspid valve regurgitation is mild. Aortic Valve: The aortic valve is  calcified. Aortic valve regurgitation is moderate. Aortic regurgitation PHT measures 549 msec. Aortic valve sclerosis/calcification is present, without any evidence of aortic stenosis. Aortic valve mean gradient measures  8.5 mmHg. Aortic valve peak gradient measures 15.6 mmHg. Aortic valve area, by VTI measures 1.75 cm. Pulmonic Valve: The pulmonic valve was not well visualized. Pulmonic valve regurgitation is not visualized. Aorta: The aortic root was not well visualized. IAS/Shunts: No atrial level shunt detected by color flow Doppler. Additional Comments: 3D was performed not requiring image post processing on an independent workstation and was indeterminate.  LEFT VENTRICLE PLAX 2D LVIDd:         3.80 cm   Diastology LVIDs:         2.20 cm   LV e' medial:    6.31 cm/s LV PW:         1.20 cm   LV E/e' medial:  15.8 LV IVS:        1.30 cm   LV e' lateral:   7.72 cm/s LVOT diam:     1.90 cm   LV E/e' lateral: 13.0 LV SV:         63 LV SV Index:   38 LVOT Area:     2.84 cm  RIGHT VENTRICLE             IVC RV Basal diam:  3.10 cm     IVC diam: 1.90 cm RV Mid diam:    1.90 cm RV S prime:     10.40 cm/s TAPSE (M-mode): 1.8 cm LEFT ATRIUM             Index        RIGHT ATRIUM           Index LA diam:        3.70 cm 2.21 cm/m   RA Area:     13.70 cm LA Vol (A2C):   68.4 ml 40.92 ml/m  RA Volume:   27.70 ml  16.57 ml/m LA Vol (A4C):   53.9 ml 32.24 ml/m LA Biplane Vol: 60.6 ml 36.25 ml/m  AORTIC VALVE AV Area (Vmax):    1.60 cm AV Area (Vmean):   1.53 cm AV Area (VTI):     1.75 cm AV Vmax:           197.25 cm/s AV Vmean:          136.750 cm/s AV VTI:            0.361 m AV Peak Grad:      15.6 mmHg AV Mean Grad:      8.5 mmHg LVOT Vmax:         111.00 cm/s LVOT Vmean:        73.900 cm/s LVOT VTI:          0.222 m LVOT/AV VTI ratio: 0.62 AI PHT:            549 msec  AORTA Ao Root diam: 2.70 cm MITRAL VALVE  TRICUSPID VALVE MV Area (PHT): 3.02 cm     TR Peak grad:   19.4 mmHg MV Decel Time: 251  msec     TR Vmax:        220.00 cm/s MV E velocity: 100.00 cm/s MV A velocity: 112.00 cm/s  SHUNTS MV E/A ratio:  0.89         Systemic VTI:  0.22 m                             Systemic Diam: 1.90 cm Cara JONETTA Lovelace MD Electronically signed by Cara JONETTA Lovelace MD Signature Date/Time: 08/17/2024/2:23:37 PM    Final    CT CHEST ABDOMEN PELVIS WO CONTRAST Result Date: 08/16/2024 CLINICAL DATA:  Sepsis right flank pain dyspnea on exertion EXAM: CT CHEST, ABDOMEN AND PELVIS WITHOUT CONTRAST TECHNIQUE: Multidetector CT imaging of the chest, abdomen and pelvis was performed following the standard protocol without IV contrast. RADIATION DOSE REDUCTION: This exam was performed according to the departmental dose-optimization program which includes automated exposure control, adjustment of the mA and/or kV according to patient size and/or use of iterative reconstruction technique. COMPARISON:  Chest x-ray 08/16/2024, CT 04/15/2023, 08/11/2023 FINDINGS: CT CHEST FINDINGS Cardiovascular: Limited assessment without intravenous contrast. Moderate aortic atherosclerosis. No aneurysm. Aortic valvular calcification. Coronary vascular calcification. Cardiomegaly. No pericardial effusion Mediastinum/Nodes: Patent trachea. No thyroid mass. No suspicious lymph nodes. Esophagus within normal limits. Lungs/Pleura: Trace right effusion. Mild apical scarring. No consolidation, or pneumothorax. Musculoskeletal: No acute osseous abnormality.  Degenerative changes CT ABDOMEN PELVIS FINDINGS Hepatobiliary: No focal liver abnormality is seen. No gallstones, gallbladder wall thickening, or biliary dilatation. Pancreas: Atrophic. No acute inflammation. Numerous hypodensities throughout the pancreas as was seen on prior imaging. Reference right inferior pancreatic head low-density lesion measuring 20 mm on series 2, image 64, previously 19 mm. These have been present since 2015 Spleen: Normal in size without focal abnormality. Adrenals/Urinary  Tract: Adrenal glands are normal. No hydronephrosis on the left. Interim development of moderate severe right hydronephrosis. Perinephric fat stranding on the right. No obstructing stone is seen. The bladder is unremarkable Stomach/Bowel: The stomach is decompressed. There is no dilated small bowel. No acute bowel wall thickening. Diverticular disease of the left colon Vascular/Lymphatic: Aortic atherosclerosis. No enlarged abdominal or pelvic lymph nodes. Reproductive: Uterus and bilateral adnexa are unremarkable. Other: Negative for ascites or free air Musculoskeletal: Degenerative changes.  No acute osseous abnormality IMPRESSION: 1. Interim development of severe right hydronephrosis and perinephric fat stranding. No obstructing stone is seen. Concern raised for obstructing process at the right UPJ. Fat stranding is nonspecific but superimposed upper UTI/pyelonephritis not excluded 2. Trace right effusion. 3. Cardiomegaly. 4. Numerous hypodensities/cystic density throughout the pancreas as was seen on prior imaging and present dating back to 2015. No specific imaging follow-up recommended given patient age. 5. Diverticular disease of the left colon without acute inflammatory process. 6. Aortic atherosclerosis. Aortic Atherosclerosis (ICD10-I70.0). Electronically Signed   By: Luke Bun M.D.   On: 08/16/2024 16:50   DG Chest Portable 1 View Result Date: 08/16/2024 CLINICAL DATA:  Hypoxia. EXAM: PORTABLE CHEST 1 VIEW COMPARISON:  04/15/2023. FINDINGS: Low lung volumes. Cardiomegaly. Chronic elevation of the right hemidiaphragm. Bilateral interstitial prominence, most pronounced at the lung bases, suspicious for pulmonary vascular congestion with possible mild interstitial pulmonary edema. Mild right basilar atelectasis. No pneumothorax. No acute osseous abnormality. IMPRESSION: 1. Low lung volumes with bilateral interstitial prominence most pronounced at the lung  bases, suspicious for pulmonary vascular  congestion with possible mild interstitial edema. 2. Cardiomegaly. 3. Chronic elevation of the right hemidiaphragm with mild right basilar atelectasis. Electronically Signed   By: Harrietta Sherry M.D.   On: 08/16/2024 15:20    EKG: Sinus tach rate of 110 nonspecific ST-T wave changes  ASSESSMENT AND PLAN:  Short of breath Congestive heart failure Community-acquired pneumonia Elevated troponins Hypertension Hyperlipidemia Borderline obesity Acute on chronic renal insufficiency Elevated white count . Agree with admit follow-up troponins EKGs telemetry Supplemental oxygen antibiotics inhalers consider  pulmonary input for pneumonia Agree with hypertension management and control currently on metoprolol  and losartan  Hyperlipidemia maintain simvastatin  therapy for lipid management Echocardiogram for assessment of left ventricular function wall motion Antibiotic therapy for possible urinary tract infection Recommend conservative cardiac input at this point no invasive procedures recommended  Signed: Cara JONETTA Lovelace MD,  08/17/2024, 4:21 PM      "

## 2024-08-17 NOTE — Progress Notes (Addendum)
 "    Progress Note    Dawn Dickerson  FMW:[id number] DOB: 02-13-1932  DOA: 08/16/2024 PCP: Fernande Ophelia JINNY DOUGLAS, MD      Brief Narrative:    Medical records reviewed and are as summarized below:  Dawn Dickerson is a 89 y.o. 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  Reportedly, when EMS arrived, oxygen saturation was 78% on room air.  She was placed on 15 L oxygen via nonrebreather mask and oxygen came up to 97%.  She was subsequently transported to the ED for further management.  Vital signs in the ED: Temperature 99.6 F (went up to 101 F), respiratory rate 26-30, heart rate 113, oxygen saturation 100% on 15 L via nonrebreather mask.  BP dropped to 96/48.   Labs significant for sodium 130, bicarb 17, creatinine 1.78, GFR 27, troponin 464, proBNP greater than 35,000, lactic acid 4.6, WBC 17.9, hemoglobin 8.9.  Chest x-ray IMPRESSION: 1. Low lung volumes with bilateral interstitial prominence most pronounced at the lung bases, suspicious for pulmonary vascular congestion with possible mild interstitial edema. 2. Cardiomegaly. 3. Chronic elevation of the right hemidiaphragm with mild right basilar atelectasis.    CT chest abdomen pelvis without contrast IMPRESSION: 1. Interim development of severe right hydronephrosis and perinephric fat stranding. No obstructing stone is seen. Concern raised for obstructing process at the right UPJ. Fat stranding is nonspecific but superimposed upper UTI/pyelonephritis not excluded 2. Trace right effusion. 3. Cardiomegaly. 4. Numerous hypodensities/cystic density throughout the pancreas as was seen on  prior imaging and present dating back to 2015. No specific imaging follow-up recommended given patient age. 5. Diverticular disease of the left colon without acute inflammatory process. 6. Aortic atherosclerosis.    She was started on IV fluids, IV heparin  drip, aspirin  and empiric antibiotics (Zosyn  and vancomycin ) in the ED.    Assessment/Plan:   Principal Problem:   Severe sepsis (HCC) Active Problems:   UTI (urinary tract infection)   E coli bacteremia   AKI (acute kidney injury)   HTN (hypertension)   Hyperlipidemia   Hypothyroidism   Hydronephrosis of right kidney   Hyponatremia    Body mass index is 25.15 kg/m.   Severe sepsis secondary to acute UTI and E. coli bacteremia: Blood culture showed E. coli.  Urine culture is pending. WBC up from 17.9-29.8. Continue IV ceftriaxone   Lactic acid down from 4.6-2.7. Procalcitonin 100 No evidence of pneumonia on CT chest.  Acute on chronic diastolic CHF: Appears euvolemic.  No Lasix  for now. proBNP greater than 35,000. 2D echo from 08/17/2024 showed EF estimated at 70 to 75% with hyperdynamic LV function, moderate concentric LVH, grade 1 diastolic dysfunction, mildly dilated left and right atria, moderate aortic regurgitation, mild mitral regurgitation and no evidence of left ventricular regional wall motion abnormalities. 2D echo in January 2025 showed EF> 55%   Severe right hydronephrosis, concern for obstructing process at right UPJ: No clear obstructing stone, urolithiasis or nephrolithiasis seen on CT scanning.  Consulted Dr. Twylla, urologist, to assist with management.   AKI on CKD stage IIIa: Creatinine about the same.  1.74-1.78.  Continue to monitor. Creatinine was 1.2 on 08/05/2024.   Hyponatremia: Sodium down from 130-128.  Continue to monitor.  Elevated troponins: 464-533.  This is likely due to demand ischemia. 2D echo showed preserved EF. Discontinue IV heparin  drip.  Follow-up with  cardiologist.   Comorbidities include hypertension, hyperlipidemia, hypothyroidism, depression    Diet Order             Diet regular Room service appropriate? Yes; Fluid consistency: Thin  Diet effective now                                  Consultants: Cardiologist  Procedures: None    Medications:    aspirin  EC  81 mg Oral Daily   escitalopram   10 mg Oral Daily   levothyroxine   100 mcg Oral Q0600   simvastatin   20 mg Oral Daily   Continuous Infusions:  cefTRIAXone  (ROCEPHIN )  IV       Anti-infectives (From admission, onward)    Start     Dose/Rate Route Frequency Ordered Stop   08/17/24 2200  cefTRIAXone  (ROCEPHIN ) 2 g in sodium chloride  0.9 % 100 mL IVPB        2 g 200 mL/hr over 30 Minutes Intravenous Every 24 hours 08/17/24 1322     08/16/24 2200  cefTRIAXone  (ROCEPHIN ) 2 g in sodium chloride  0.9 % 100 mL IVPB  Status:  Discontinued        2 g 200 mL/hr over 30 Minutes Intravenous Every 24 hours 08/16/24 1551 08/17/24 1322   08/16/24 1700  azithromycin  (ZITHROMAX ) tablet 500 mg  Status:  Discontinued        500 mg Oral Daily 08/16/24 1654 08/17/24 1426   08/16/24 1600  vancomycin  (VANCOREADY) IVPB 1250 mg/250 mL        1,250 mg 166.7 mL/hr over 90 Minutes Intravenous  Once 08/16/24 1517 08/16/24 1812   08/16/24 1600  azithromycin  (ZITHROMAX ) 500 mg in sodium chloride  0.9 % 250 mL IVPB  Status:  Discontinued        500 mg 250 mL/hr over 60 Minutes Intravenous Every 24 hours 08/16/24 1551 08/16/24 1654   08/16/24 1515  piperacillin -tazobactam (ZOSYN ) IVPB 3.375 g        3.375 g 100 mL/hr over 30 Minutes Intravenous  Once 08/16/24 1511 08/16/24 1606              Family Communication/Anticipated D/C date and plan/Code Status   DVT prophylaxis: SCDs Start: 08/16/24 1550     Code Status: Full Code  Family Communication: None Disposition Plan: Plan to discharge home   Status is: Inpatient Remains inpatient appropriate  because: Sepsis from acute UTI and E. coli bacteremia       Subjective:   Interval events noted.  She complains of frequent urination and general weakness.  No chest pain, shortness of breath or abdominal pain.  Randine, RN, at the bedside  Objective:    Vitals:   08/16/24 2032 08/17/24 0009 08/17/24 0512 08/17/24 0849  BP: (!) 102/52 (!) 117/57 (!) 113/56 (!) 142/77  Pulse: 93 78 73 88  Resp: 20 15    Temp: 97.6 F (36.4 C) 97.7 F (36.5 C) 97.9 F (36.6 C) 97.7 F (36.5 C)  TempSrc: Oral Oral Oral Oral  SpO2: 99% 99% 100% 98%  Weight:   64.4 kg   Height:       No data found.   Intake/Output Summary (Last 24 hours) at 08/17/2024 1449 Last data filed at 08/16/2024 2340 Gross per 24 hour  Intake 357.55 ml  Output --  Net 357.55 ml   Filed Weights   08/16/24 1348 08/17/24 0512  Weight: 64 kg 64.4 kg    Exam:  GEN: NAD SKIN: Warm and dry EYES: No pallor or icterus ENT: MMM CV: RRR PULM: Bibasilar rales.  No wheezing. ABD: soft, ND, NT, +BS CNS: AAO x 3, non focal EXT: No edema or tenderness        Data Reviewed:   I have personally reviewed following labs and imaging studies:  Labs: Labs show the following:   Basic Metabolic Panel: Recent Labs  Lab 08/16/24 1359 08/17/24 0332  NA 130* 128*  K 4.0 3.8  CL 96* 95*  CO2 17* 18*  GLUCOSE 111* 91  BUN 18 24*  CREATININE 1.78* 1.74*  CALCIUM 8.5* 8.1*   GFR Estimated Creatinine Clearance: 19.4 mL/min (A) (by C-G formula based on SCr of 1.74 mg/dL (H)). Liver Function Tests: Recent Labs  Lab 08/16/24 1359 08/17/24 0332  AST 66* 73*  ALT <5 <5  ALKPHOS 359* 290*  BILITOT 0.5 0.3  PROT 6.3* 6.1*  ALBUMIN 3.2* 3.0*   No results for input(s): LIPASE, AMYLASE in the last 168 hours. No results for input(s): AMMONIA in the last 168 hours. Coagulation profile Recent Labs  Lab 08/16/24 1359  INR 1.2    CBC: Recent Labs  Lab 08/16/24 1359 08/17/24 0332  WBC 17.9* 29.8*   NEUTROABS 16.8*  --   HGB 8.9* 8.5*  HCT 27.9* 25.9*  MCV 83.0 81.2  PLT 279 274   Cardiac Enzymes: No results for input(s): CKTOTAL, CKMB, CKMBINDEX, TROPONINI in the last 168 hours. BNP (last 3 results) Recent Labs    08/16/24 1359  PROBNP >35,000.0*   CBG: No results for input(s): GLUCAP in the last 168 hours. D-Dimer: No results for input(s): DDIMER in the last 72 hours. Hgb A1c: No results for input(s): HGBA1C in the last 72 hours. Lipid Profile: No results for input(s): CHOL, HDL, LDLCALC, TRIG, CHOLHDL, LDLDIRECT in the last 72 hours. Thyroid function studies: No results for input(s): TSH, T4TOTAL, T3FREE, THYROIDAB in the last 72 hours.  Invalid input(s): FREET3 Anemia work up: No results for input(s): VITAMINB12, FOLATE, FERRITIN, TIBC, IRON, RETICCTPCT in the last 72 hours. Sepsis Labs: Recent Labs  Lab 08/16/24 1359 08/16/24 1432 08/16/24 1559 08/16/24 1600 08/17/24 0332  PROCALCITON  --   --  100.00  --   --   WBC 17.9*  --   --   --  29.8*  LATICACIDVEN  --  4.6*  --  2.7*  --     Microbiology Recent Results (from the past 240 hours)  Resp panel by RT-PCR (RSV, Flu A&B, Covid) Anterior Nasal Swab     Status: None   Collection Time: 08/16/24  1:59 PM   Specimen: Anterior Nasal Swab  Result Value Ref Range Status   SARS Coronavirus 2 by RT PCR NEGATIVE NEGATIVE Final    Comment: (NOTE) SARS-CoV-2 target nucleic acids are NOT DETECTED.  The SARS-CoV-2 RNA is generally detectable in upper respiratory specimens during the acute phase of infection. The lowest concentration of SARS-CoV-2 viral copies this assay can detect is 138 copies/mL. A negative result does not preclude SARS-Cov-2 infection and should not be used as the sole basis for treatment or other patient management decisions. A negative result may occur with  improper specimen collection/handling, submission of specimen other than  nasopharyngeal swab, presence of viral mutation(s) within the areas targeted by this assay, and inadequate number  of viral copies(<138 copies/mL). A negative result must be combined with clinical observations, patient history, and epidemiological information. The expected result is Negative.  Fact Sheet for Patients:  bloggercourse.com  Fact Sheet for Healthcare Providers:  seriousbroker.it  This test is no t yet approved or cleared by the [place]  FDA and  has been authorized for detection and/or diagnosis of SARS-CoV-2 by FDA under an Emergency Use Authorization (EUA). This EUA will remain  in effect (meaning this test can be used) for the duration of the COVID-19 declaration under Section 564(b)(1) of the Act, 21 U.S.C.section 360bbb-3(b)(1), unless the authorization is terminated  or revoked sooner.       Influenza A by PCR NEGATIVE NEGATIVE Final   Influenza B by PCR NEGATIVE NEGATIVE Final    Comment: (NOTE) The Xpert Xpress SARS-CoV-2/FLU/RSV plus assay is intended as an aid in the diagnosis of influenza from Nasopharyngeal swab specimens and should not be used as a sole basis for treatment. Nasal washings and aspirates are unacceptable for Xpert Xpress SARS-CoV-2/FLU/RSV testing.  Fact Sheet for Patients: bloggercourse.com  Fact Sheet for Healthcare Providers: seriousbroker.it  This test is not yet approved or cleared by the [place]  FDA and has been authorized for detection and/or diagnosis of SARS-CoV-2 by FDA under an Emergency Use Authorization (EUA). This EUA will remain in effect (meaning this test can be used) for the duration of the COVID-19 declaration under Section 564(b)(1) of the Act, 21 U.S.C. section 360bbb-3(b)(1), unless the authorization is terminated or revoked.     Resp Syncytial Virus by PCR NEGATIVE NEGATIVE Final    Comment:  (NOTE) Fact Sheet for Patients: bloggercourse.com  Fact Sheet for Healthcare Providers: seriousbroker.it  This test is not yet approved or cleared by the [place]  FDA and has been authorized for detection and/or diagnosis of SARS-CoV-2 by FDA under an Emergency Use Authorization (EUA). This EUA will remain in effect (meaning this test can be used) for the duration of the COVID-19 declaration under Section 564(b)(1) of the Act, 21 U.S.C. section 360bbb-3(b)(1), unless the authorization is terminated or revoked.  Performed at [facility], [address] [place], [place] [address]   Blood Culture (routine x 2)     Status: None (Preliminary result)   Collection Time: 08/16/24  2:32 PM   Specimen: BLOOD  Result Value Ref Range Status   Specimen Description   Final    BLOOD BLOOD RIGHT ARM Performed at [facility], [address] [place], [place] [address]    Special Requests   Final    BOTTLES DRAWN AEROBIC AND ANAEROBIC Blood Culture adequate volume Performed at [facility], [address] [place], [place] [address]    Culture  Setup Time   Final    GRAM NEGATIVE RODS IN BOTH AEROBIC AND ANAEROBIC BOTTLES CRITICAL RESULT CALLED TO, READ BACK BY AND VERIFIED WITH:  NATHAN BELUE AT 0035 08/17/24 JG Performed at [facility], [address] [place], [place] [address]    Culture GRAM NEGATIVE RODS  Final   Report Status PENDING  Incomplete  Blood Culture (routine x 2)     Status: None (Preliminary result)   Collection Time: 08/16/24  2:32 PM   Specimen: BLOOD  Result Value Ref Range Status   Specimen Description   Final    BLOOD BLOOD LEFT ARM Performed at [facility], [address] [place], [place] [address]    Special Requests   Final    BOTTLES  DRAWN AEROBIC AND ANAEROBIC Blood Culture adequate volume Performed at [facility], [address]  [place], [place] [address]    Culture  Setup Time   Final    GRAM NEGATIVE RODS IN BOTH AEROBIC AND ANAEROBIC BOTTLES CRITICAL VALUE NOTED.  VALUE IS CONSISTENT WITH PREVIOUSLY REPORTED AND CALLED VALUE. Performed at [facility], [address] [place], [place] [address]    Culture GRAM NEGATIVE RODS  Final   Report Status PENDING  Incomplete  Blood Culture ID Panel (Reflexed)     Status: Abnormal   Collection Time: 08/16/24  2:32 PM  Result Value Ref Range Status   Enterococcus faecalis NOT DETECTED NOT DETECTED Final   Enterococcus Faecium NOT DETECTED NOT DETECTED Final   Listeria monocytogenes NOT DETECTED NOT DETECTED Final   Staphylococcus species NOT DETECTED NOT DETECTED Final   Staphylococcus aureus (BCID) NOT DETECTED NOT DETECTED Final   Staphylococcus epidermidis NOT DETECTED NOT DETECTED Final   Staphylococcus lugdunensis NOT DETECTED NOT DETECTED Final   Streptococcus species NOT DETECTED NOT DETECTED Final   Streptococcus agalactiae NOT DETECTED NOT DETECTED Final   Streptococcus pneumoniae NOT DETECTED NOT DETECTED Final   Streptococcus pyogenes NOT DETECTED NOT DETECTED Final   A.calcoaceticus-baumannii NOT DETECTED NOT DETECTED Final   Bacteroides fragilis NOT DETECTED NOT DETECTED Final   Enterobacterales DETECTED (A) NOT DETECTED Final    Comment: Enterobacterales represent a large order of gram negative bacteria, not a single organism. CRITICAL RESULT CALLED TO, READ BACK BY AND VERIFIED WITH:  NATHAN BELUE AT 0035 08/17/24 JG    Enterobacter cloacae complex NOT DETECTED NOT DETECTED Final   Escherichia coli DETECTED (A) NOT DETECTED Final    Comment: CRITICAL RESULT CALLED TO, READ BACK BY AND VERIFIED WITH:  NATHAN BELUE AT 0035 08/17/24 JG    Klebsiella aerogenes NOT DETECTED NOT DETECTED Final   Klebsiella oxytoca NOT DETECTED NOT DETECTED Final   Klebsiella pneumoniae NOT DETECTED NOT DETECTED Final   Proteus species NOT DETECTED NOT DETECTED Final    Salmonella species NOT DETECTED NOT DETECTED Final   Serratia marcescens NOT DETECTED NOT DETECTED Final   Haemophilus influenzae NOT DETECTED NOT DETECTED Final   Neisseria meningitidis NOT DETECTED NOT DETECTED Final   Pseudomonas aeruginosa NOT DETECTED NOT DETECTED Final   Stenotrophomonas maltophilia NOT DETECTED NOT DETECTED Final   Candida albicans NOT DETECTED NOT DETECTED Final   Candida auris NOT DETECTED NOT DETECTED Final   Candida glabrata NOT DETECTED NOT DETECTED Final   Candida krusei NOT DETECTED NOT DETECTED Final   Candida parapsilosis NOT DETECTED NOT DETECTED Final   Candida tropicalis NOT DETECTED NOT DETECTED Final   Cryptococcus neoformans/gattii NOT DETECTED NOT DETECTED Final   CTX-M ESBL NOT DETECTED NOT DETECTED Final   Carbapenem resistance IMP NOT DETECTED NOT DETECTED Final   Carbapenem resistance KPC NOT DETECTED NOT DETECTED Final   Carbapenem resistance NDM NOT DETECTED NOT DETECTED Final   Carbapenem resist OXA 48 LIKE NOT DETECTED NOT DETECTED Final   Carbapenem resistance VIM NOT DETECTED NOT DETECTED Final    Comment: Performed at [facility], [address] [place], [place] [address]    Procedures and diagnostic studies:  ECHOCARDIOGRAM COMPLETE Result Date: 08/17/2024    ECHOCARDIOGRAM REPORT   Patient Name:   CELESTIA DUVA Date of Exam: 08/17/2024 Medical Rec #:  [id number]       Height:       63.0 in Accession #:    [id number]  Weight:       142.0 lb Date of Birth:  09-May-1932       BSA:          1.672 m Patient Age:    89 years        BP:           142/77 mmHg Patient Gender: F               HR:           79 bpm. Exam Location:  [facility] Procedure: 2D Echo, Cardiac Doppler and Color Doppler (Both Spectral and Color            Flow Doppler were utilized during procedure). Indications:     CHF  History:         Patient has no prior history of Echocardiogram examinations.                  Risk Factors:Hypertension and Dyslipidemia.   Sonographer:     Philomena Daring Referring Phys:  [id number] XZDYJA PAUDEL Diagnosing Phys: Cara JONETTA Lovelace MD IMPRESSIONS  1. Left ventricular ejection fraction, by estimation, is 70 to 75%. The left ventricle has hyperdynamic function. The left ventricle has no regional wall motion abnormalities. There is mild concentric left ventricular hypertrophy. Left ventricular diastolic parameters are consistent with Grade I diastolic dysfunction (impaired relaxation).  2. Right ventricular systolic function is normal. The right ventricular size is normal.  3. Left atrial size was mildly dilated.  4. Right atrial size was mildly dilated.  5. The mitral valve is grossly normal. Mild mitral valve regurgitation.  6. The aortic valve is calcified. Aortic valve regurgitation is moderate. Aortic valve sclerosis/calcification is present, without any evidence of aortic stenosis. FINDINGS  Left Ventricle: Left ventricular ejection fraction, by estimation, is 70 to 75%. The left ventricle has hyperdynamic function. The left ventricle has no regional wall motion abnormalities. Strain was performed and the global longitudinal strain is indeterminate. The left ventricular internal cavity size was normal in size. There is mild concentric left ventricular hypertrophy. Left ventricular diastolic parameters are consistent with Grade I diastolic dysfunction (impaired relaxation). Right Ventricle: The right ventricular size is normal. No increase in right ventricular wall thickness. Right ventricular systolic function is normal. Left Atrium: Left atrial size was mildly dilated. Right Atrium: Right atrial size was mildly dilated. Pericardium: There is no evidence of pericardial effusion. Mitral Valve: The mitral valve is grossly normal. Mild mitral valve regurgitation. Tricuspid Valve: The tricuspid valve is normal in structure. Tricuspid valve regurgitation is mild. Aortic Valve: The aortic valve is calcified. Aortic valve regurgitation is  moderate. Aortic regurgitation PHT measures 549 msec. Aortic valve sclerosis/calcification is present, without any evidence of aortic stenosis. Aortic valve mean gradient measures  8.5 mmHg. Aortic valve peak gradient measures 15.6 mmHg. Aortic valve area, by VTI measures 1.75 cm. Pulmonic Valve: The pulmonic valve was not well visualized. Pulmonic valve regurgitation is not visualized. Aorta: The aortic root was not well visualized. IAS/Shunts: No atrial level shunt detected by color flow Doppler. Additional Comments: 3D was performed not requiring image post processing on an independent workstation and was indeterminate.  LEFT VENTRICLE PLAX 2D LVIDd:         3.80 cm   Diastology LVIDs:         2.20 cm   LV e' medial:    6.31 cm/s LV PW:         1.20 cm   LV E/e'  medial:  15.8 LV IVS:        1.30 cm   LV e' lateral:   7.72 cm/s LVOT diam:     1.90 cm   LV E/e' lateral: 13.0 LV SV:         63 LV SV Index:   38 LVOT Area:     2.84 cm  RIGHT VENTRICLE             IVC RV Basal diam:  3.10 cm     IVC diam: 1.90 cm RV Mid diam:    1.90 cm RV S prime:     10.40 cm/s TAPSE (M-mode): 1.8 cm LEFT ATRIUM             Index        RIGHT ATRIUM           Index LA diam:        3.70 cm 2.21 cm/m   RA Area:     13.70 cm LA Vol (A2C):   68.4 ml 40.92 ml/m  RA Volume:   27.70 ml  16.57 ml/m LA Vol (A4C):   53.9 ml 32.24 ml/m LA Biplane Vol: 60.6 ml 36.25 ml/m  AORTIC VALVE AV Area (Vmax):    1.60 cm AV Area (Vmean):   1.53 cm AV Area (VTI):     1.75 cm AV Vmax:           197.25 cm/s AV Vmean:          136.750 cm/s AV VTI:            0.361 m AV Peak Grad:      15.6 mmHg AV Mean Grad:      8.5 mmHg LVOT Vmax:         111.00 cm/s LVOT Vmean:        73.900 cm/s LVOT VTI:          0.222 m LVOT/AV VTI ratio: 0.62 AI PHT:            549 msec  AORTA Ao Root diam: 2.70 cm MITRAL VALVE                TRICUSPID VALVE MV Area (PHT): 3.02 cm     TR Peak grad:   19.4 mmHg MV Decel Time: 251 msec     TR Vmax:        220.00 cm/s MV E  velocity: 100.00 cm/s MV A velocity: 112.00 cm/s  SHUNTS MV E/A ratio:  0.89         Systemic VTI:  0.22 m                             Systemic Diam: 1.90 cm Cara JONETTA Lovelace MD Electronically signed by Cara JONETTA Lovelace MD Signature Date/Time: 08/17/2024/2:23:37 PM    Final    CT CHEST ABDOMEN PELVIS WO CONTRAST Result Date: 08/16/2024 CLINICAL DATA:  Sepsis right flank pain dyspnea on exertion EXAM: CT CHEST, ABDOMEN AND PELVIS WITHOUT CONTRAST TECHNIQUE: Multidetector CT imaging of the chest, abdomen and pelvis was performed following the standard protocol without IV contrast. RADIATION DOSE REDUCTION: This exam was performed according to the departmental dose-optimization program which includes automated exposure control, adjustment of the mA and/or kV according to patient size and/or use of iterative reconstruction technique. COMPARISON:  Chest x-ray 08/16/2024, CT 04/15/2023, 08/11/2023 FINDINGS: CT CHEST FINDINGS Cardiovascular: Limited assessment without intravenous contrast. Moderate aortic atherosclerosis. No aneurysm.  Aortic valvular calcification. Coronary vascular calcification. Cardiomegaly. No pericardial effusion Mediastinum/Nodes: Patent trachea. No thyroid mass. No suspicious lymph nodes. Esophagus within normal limits. Lungs/Pleura: Trace right effusion. Mild apical scarring. No consolidation, or pneumothorax. Musculoskeletal: No acute osseous abnormality.  Degenerative changes CT ABDOMEN PELVIS FINDINGS Hepatobiliary: No focal liver abnormality is seen. No gallstones, gallbladder wall thickening, or biliary dilatation. Pancreas: Atrophic. No acute inflammation. Numerous hypodensities throughout the pancreas as was seen on prior imaging. Reference right inferior pancreatic head low-density lesion measuring 20 mm on series 2, image 64, previously 19 mm. These have been present since 2015 Spleen: Normal in size without focal abnormality. Adrenals/Urinary Tract: Adrenal glands are normal. No  hydronephrosis on the left. Interim development of moderate severe right hydronephrosis. Perinephric fat stranding on the right. No obstructing stone is seen. The bladder is unremarkable Stomach/Bowel: The stomach is decompressed. There is no dilated small bowel. No acute bowel wall thickening. Diverticular disease of the left colon Vascular/Lymphatic: Aortic atherosclerosis. No enlarged abdominal or pelvic lymph nodes. Reproductive: Uterus and bilateral adnexa are unremarkable. Other: Negative for ascites or free air Musculoskeletal: Degenerative changes.  No acute osseous abnormality IMPRESSION: 1. Interim development of severe right hydronephrosis and perinephric fat stranding. No obstructing stone is seen. Concern raised for obstructing process at the right UPJ. Fat stranding is nonspecific but superimposed upper UTI/pyelonephritis not excluded 2. Trace right effusion. 3. Cardiomegaly. 4. Numerous hypodensities/cystic density throughout the pancreas as was seen on prior imaging and present dating back to 2015. No specific imaging follow-up recommended given patient age. 5. Diverticular disease of the left colon without acute inflammatory process. 6. Aortic atherosclerosis. Aortic Atherosclerosis (ICD10-I70.0). Electronically Signed   By: Luke Bun M.D.   On: 08/16/2024 16:50   DG Chest Portable 1 View Result Date: 08/16/2024 CLINICAL DATA:  Hypoxia. EXAM: PORTABLE CHEST 1 VIEW COMPARISON:  04/15/2023. FINDINGS: Low lung volumes. Cardiomegaly. Chronic elevation of the right hemidiaphragm. Bilateral interstitial prominence, most pronounced at the lung bases, suspicious for pulmonary vascular congestion with possible mild interstitial pulmonary edema. Mild right basilar atelectasis. No pneumothorax. No acute osseous abnormality. IMPRESSION: 1. Low lung volumes with bilateral interstitial prominence most pronounced at the lung bases, suspicious for pulmonary vascular congestion with possible mild interstitial  edema. 2. Cardiomegaly. 3. Chronic elevation of the right hemidiaphragm with mild right basilar atelectasis. Electronically Signed   By: Harrietta Sherry M.D.   On: 08/16/2024 15:20               LOS: 1 day   Obaloluwa Delatte  [facility]   Pager on [email or web address]. If 7PM-7AM, please contact night-coverage at [email or web address]     08/17/2024, 2:49 PM           "

## 2024-08-17 NOTE — Progress Notes (Signed)
 OT Cancellation Note  Patient Details Name: Dawn Dickerson MRN: [id number] DOB: 1931/11/12   Cancelled Treatment:    Reason Eval/Treat Not Completed: Other (comment). Pt is currently getting an echo that just began. Will follow up at later date/time as available.  Dawn Dickerson 08/17/2024, 1:37 PM

## 2024-08-17 NOTE — Evaluation (Signed)
 Physical Therapy Evaluation Patient Details Name: Dawn Dickerson MRN: [id number] DOB: 1932-06-25 Today's Date: 08/17/2024  History of Present Illness  Pt is a pleasant 89 y.o. female with medical history significant for HTN, HLD, macular degeneration, and pulmonary HTN who came in to [facility] ED from home for progressive worsening shortness of breath. MD assessment includes: severe sepsis secondary to acute UTI and E. coli bacteremia, acute on chronic diastolic CHF, severe right hydronephrosis, AKI, elevated troponins likely due to demand ischemia, and hyponatremia.   Clinical Impression  Pt was pleasant and motivated to participate during the session and put forth good effort throughout. Pt found on 2LO2/min with SpO2 in the high 90s.  Nursing in room and agreed to trial on room air.  Pt's SpO2 at rest on RA 95%.  Pt required no physical assistance with functional tasks per below and presented with good stability and control with transfers from various surfaces and during amb with a RW.  Pt's SpO2 on room air during ambulation dropped to a low of 92% and increased quickly once back in sitting to the mid 90s.  Nursing updated and gave ok for pt to be left on room air.  Pt will benefit from continued PT services upon discharge to safely address deficits listed in patient problem list for decreased caregiver assistance and eventual return to PLOF.          If plan is discharge home, recommend the following: A little help with walking and/or transfers;A little help with bathing/dressing/bathroom;Assistance with cooking/housework;Help with stairs or ramp for entrance;Assist for transportation   Can travel by private vehicle        Equipment Recommendations None recommended by PT  Recommendations for Other Services       Functional Status Assessment Patient has had a recent decline in their functional status and demonstrates the ability to make significant improvements in function in a reasonable  and predictable amount of time.     Precautions / Restrictions Precautions Precautions: Fall Restrictions Weight Bearing Restrictions Per Provider Order: No      Mobility  Bed Mobility               General bed mobility comments: NT, pt in sitting pre/post session    Transfers Overall transfer level: Needs assistance Equipment used: Rolling walker (2 wheels) Transfers: Sit to/from Stand Sit to Stand: Supervision           General transfer comment: Good eccentric and concentric control and stability from various surfaces    Ambulation/Gait Ambulation/Gait assistance: Supervision Gait Distance (Feet): 15 Feet x 2 on 2LO2/min, 1 x 40 feet on room air  Assistive device: Rolling walker (2 wheels) Gait Pattern/deviations: Step-through pattern, Decreased step length - right, Decreased step length - left Gait velocity: decreased     General Gait Details: Min reduced cadence but steady with no overt LOB including with start/stops and 180 deg turns  Careers Information Officer     Tilt Bed    Modified Rankin (Stroke Patients Only)       Balance Overall balance assessment: Needs assistance   Sitting balance-Leahy Scale: Normal     Standing balance support: Bilateral upper extremity supported, During functional activity Standing balance-Leahy Scale: Good                               Pertinent Vitals/Pain Pain Assessment Pain  Assessment: No/denies pain    Home Living Family/patient expects to be discharged to:: Private residence Living Arrangements: Alone Available Help at Discharge: Family;Available 24 hours/day Type of Home: House Home Access: Stairs to enter Entrance Stairs-Rails: Left Entrance Stairs-Number of Steps: 4   Home Layout: One level Home Equipment: Grab bars - tub/shower;Standard Walker;Rollator (4 wheels) Additional Comments: Per patient, may discharge to son's home initially with 3 steps to enter and no  rails    Prior Function Prior Level of Function : History of Falls (last six months)             Mobility Comments: Ind amb mostly without an AD in the home, limited community ambulation with PRN rollator use, one fall in the last 6 months due to tripping ADLs Comments: Son assists with driving, Ind with ADLs     Extremity/Trunk Assessment   Upper Extremity Assessment Upper Extremity Assessment: Defer to OT evaluation    Lower Extremity Assessment Lower Extremity Assessment: Generalized weakness       Communication   Communication Communication: No apparent difficulties    Cognition Arousal: Alert Behavior During Therapy: WFL for tasks assessed/performed   PT - Cognitive impairments: No apparent impairments                         Following commands: Intact       Cueing Cueing Techniques: Verbal cues     General Comments      Exercises     Assessment/Plan    PT Assessment Patient needs continued PT services  PT Problem List Decreased strength;Decreased activity tolerance;Decreased balance;Decreased mobility;Decreased knowledge of use of DME       PT Treatment Interventions DME instruction;Gait training;Stair training;Functional mobility training;Therapeutic activities;Therapeutic exercise;Balance training;Patient/family education    PT Goals (Current goals can be found in the Care Plan section)  Acute Rehab PT Goals Patient Stated Goal: To get well and return home PT Goal Formulation: With patient Time For Goal Achievement: 08/30/24 Potential to Achieve Goals: Good    Frequency Min 2X/week     Co-evaluation               AM-PAC PT 6 Clicks Mobility  Outcome Measure Help needed turning from your back to your side while in a flat bed without using bedrails?: A Little Help needed moving from lying on your back to sitting on the side of a flat bed without using bedrails?: A Little Help needed moving to and from a bed to a chair  (including a wheelchair)?: A Little Help needed standing up from a chair using your arms (e.g., wheelchair or bedside chair)?: A Little Help needed to walk in hospital room?: A Little Help needed climbing 3-5 steps with a railing? : A Little 6 Click Score: 18    End of Session Equipment Utilized During Treatment: Gait belt;Oxygen Activity Tolerance: Patient tolerated treatment well Patient left: in chair;with call bell/phone within reach;with chair alarm set Nurse Communication: Mobility status;Other (comment) (SpO2 results on room air and that pt left on room air at end of session per above) PT Visit Diagnosis: History of falling (Z91.81);Difficulty in walking, not elsewhere classified (R26.2);Muscle weakness (generalized) (M62.81)    Time: 8476-8446 PT Time Calculation (min) (ACUTE ONLY): 30 min   Charges:   PT Evaluation $PT Eval Moderate Complexity: 1 Mod PT Treatments $Therapeutic Activity: 8-22 mins PT General Charges $$ ACUTE PT VISIT: 1 Visit    D. Glendia Bertin PT, DPT 08/17/24,  4:39 PM

## 2024-08-18 DIAGNOSIS — N12 Tubulo-interstitial nephritis, not specified as acute or chronic: Secondary | ICD-10-CM | POA: Diagnosis not present

## 2024-08-18 DIAGNOSIS — A4151 Sepsis due to Escherichia coli [E. coli]: Secondary | ICD-10-CM | POA: Diagnosis not present

## 2024-08-18 DIAGNOSIS — A419 Sepsis, unspecified organism: Secondary | ICD-10-CM

## 2024-08-18 DIAGNOSIS — N130 Hydronephrosis with ureteropelvic junction obstruction: Secondary | ICD-10-CM | POA: Diagnosis not present

## 2024-08-18 DIAGNOSIS — R6520 Severe sepsis without septic shock: Secondary | ICD-10-CM

## 2024-08-18 LAB — BASIC METABOLIC PANEL WITH GFR
Anion gap: 12 (ref 5–15)
BUN: 23 mg/dL (ref 8–23)
CO2: 21 mmol/L — ABNORMAL LOW (ref 22–32)
Calcium: 8.9 mg/dL (ref 8.9–10.3)
Chloride: 102 mmol/L (ref 98–111)
Creatinine, Ser: 1.37 mg/dL — ABNORMAL HIGH (ref 0.44–1.00)
GFR, Estimated: 37 mL/min — ABNORMAL LOW
Glucose, Bld: 88 mg/dL (ref 70–99)
Potassium: 3.8 mmol/L (ref 3.5–5.1)
Sodium: 135 mmol/L (ref 135–145)

## 2024-08-18 LAB — CBC
HCT: 29.4 % — ABNORMAL LOW (ref 36.0–46.0)
Hemoglobin: 9.6 g/dL — ABNORMAL LOW (ref 12.0–15.0)
MCH: 26.7 pg (ref 26.0–34.0)
MCHC: 32.7 g/dL (ref 30.0–36.0)
MCV: 81.7 fL (ref 80.0–100.0)
Platelets: 307 K/uL (ref 150–400)
RBC: 3.6 MIL/uL — ABNORMAL LOW (ref 3.87–5.11)
RDW: 14.5 % (ref 11.5–15.5)
WBC: 27.2 K/uL — ABNORMAL HIGH (ref 4.0–10.5)
nRBC: 0 % (ref 0.0–0.2)

## 2024-08-18 LAB — URINE CULTURE: Culture: >=100000 — AB

## 2024-08-18 LAB — LEGIONELLA PNEUMOPHILA SEROGP 1 UR AG: L. pneumophila Serogp 1 Ur Ag: NEGATIVE

## 2024-08-18 MED ORDER — ENOXAPARIN SODIUM 30 MG/0.3ML IJ SOSY
30.0000 mg | PREFILLED_SYRINGE | INTRAMUSCULAR | Status: DC
  Administered 2024-08-18 – 2024-08-19 (×2): 30 mg via SUBCUTANEOUS
  Filled 2024-08-18 (×2): qty 0.3

## 2024-08-18 MED ORDER — VITAMIN D 25 MCG (1000 UNIT) PO TABS
1000.0000 [IU] | ORAL_TABLET | Freq: Every day | ORAL | Status: DC
  Administered 2024-08-18 – 2024-08-20 (×3): 1000 [IU] via ORAL
  Filled 2024-08-18 (×3): qty 1

## 2024-08-18 MED ORDER — LATANOPROST 0.005 % OP SOLN
1.0000 [drp] | Freq: Every day | OPHTHALMIC | Status: DC
  Administered 2024-08-18 – 2024-08-19 (×2): 1 [drp] via OPHTHALMIC
  Filled 2024-08-18: qty 2.5

## 2024-08-18 MED ORDER — ADULT MULTIVITAMIN W/MINERALS CH
1.0000 | ORAL_TABLET | Freq: Every day | ORAL | Status: DC
  Administered 2024-08-18 – 2024-08-20 (×3): 1 via ORAL
  Filled 2024-08-18 (×3): qty 1

## 2024-08-18 MED ORDER — GUAIFENESIN-DM 100-10 MG/5ML PO SYRP
5.0000 mL | ORAL_SOLUTION | ORAL | Status: DC | PRN
  Administered 2024-08-18 – 2024-08-19 (×3): 5 mL via ORAL
  Filled 2024-08-18 (×3): qty 10

## 2024-08-18 MED ORDER — TIMOLOL MALEATE 0.5 % OP SOLN
1.0000 [drp] | Freq: Every day | OPHTHALMIC | Status: DC
  Administered 2024-08-18 – 2024-08-20 (×2): 1 [drp] via OPHTHALMIC
  Filled 2024-08-18: qty 5

## 2024-08-18 NOTE — Progress Notes (Signed)
 Urology Inpatient Progress Note  Subjective: No acute events overnight. She is afebrile, VSS. White count down, 27.2.  Creatinine down, 1.37. Blood cultures growing E. coli; urine culture growing gram-negative rods.  On antibiotics as below. She is accompanied today by her son and daughter-in-law at the bedside.  She reports feeling somewhat better today.  She would like to pursue right ureteral stent placement as discussed with Dr. Twylla yesterday.  Anti-infectives: Anti-infectives (From admission, onward)    Start     Dose/Rate Route Frequency Ordered Stop   08/17/24 2200  cefTRIAXone  (ROCEPHIN ) 2 g in sodium chloride  0.9 % 100 mL IVPB        2 g 200 mL/hr over 30 Minutes Intravenous Every 24 hours 08/17/24 1322     08/16/24 2200  cefTRIAXone  (ROCEPHIN ) 2 g in sodium chloride  0.9 % 100 mL IVPB  Status:  Discontinued        2 g 200 mL/hr over 30 Minutes Intravenous Every 24 hours 08/16/24 1551 08/17/24 1322   08/16/24 1700  azithromycin  (ZITHROMAX ) tablet 500 mg  Status:  Discontinued        500 mg Oral Daily 08/16/24 1654 08/17/24 1426   08/16/24 1600  vancomycin  (VANCOREADY) IVPB 1250 mg/250 mL        1,250 mg 166.7 mL/hr over 90 Minutes Intravenous  Once 08/16/24 1517 08/16/24 1812   08/16/24 1600  azithromycin  (ZITHROMAX ) 500 mg in sodium chloride  0.9 % 250 mL IVPB  Status:  Discontinued        500 mg 250 mL/hr over 60 Minutes Intravenous Every 24 hours 08/16/24 1551 08/16/24 1654   08/16/24 1515  piperacillin -tazobactam (ZOSYN ) IVPB 3.375 g        3.375 g 100 mL/hr over 30 Minutes Intravenous  Once 08/16/24 1511 08/16/24 1606       Current Facility-Administered Medications  Medication Dose Route Frequency Provider Last Rate Last Admin   acetaminophen  (TYLENOL ) tablet 650 mg  650 mg Oral Q6H PRN Paudel, Keshab, MD   650 mg at 08/16/24 1742   Or   acetaminophen  (TYLENOL ) suppository 650 mg  650 mg Rectal Q6H PRN Roann Gouty, MD       aspirin  EC tablet 81 mg  81 mg Oral  Daily Paudel, Keshab, MD   81 mg at 08/17/24 0917   cefTRIAXone  (ROCEPHIN ) 2 g in sodium chloride  0.9 % 100 mL IVPB  2 g Intravenous Q24H Patel, Kishan S, RPH 200 mL/hr at 08/17/24 2103 2 g at 08/17/24 2103   escitalopram  (LEXAPRO ) tablet 10 mg  10 mg Oral Daily Paudel, Gouty, MD   10 mg at 08/17/24 9082   fentaNYL  (SUBLIMAZE ) injection 12.5 mcg  12.5 mcg Intravenous Q2H PRN Roann Gouty, MD       guaiFENesin -dextromethorphan  (ROBITUSSIN DM) 100-10 MG/5ML syrup 5 mL  5 mL Oral Q4H PRN Tariq, Hassan, MD       ipratropium-albuterol  (DUONEB) 0.5-2.5 (3) MG/3ML nebulizer solution 3 mL  3 mL Nebulization Q6H PRN Paudel, Gouty, MD   3 mL at 08/18/24 0850   levothyroxine  (SYNTHROID ) tablet 100 mcg  100 mcg Oral Q0600 Paudel, Keshab, MD   100 mcg at 08/17/24 9447   ondansetron  (ZOFRAN ) tablet 4 mg  4 mg Oral Q6H PRN Paudel, Keshab, MD       Or   ondansetron  (ZOFRAN ) injection 4 mg  4 mg Intravenous Q6H PRN Paudel, Keshab, MD       oxyCODONE  (Oxy IR/ROXICODONE ) immediate release tablet 5 mg  5 mg Oral  Q4H PRN Paudel, Keshab, MD       polyethylene glycol (MIRALAX  / GLYCOLAX ) packet 17 g  17 g Oral Daily PRN Paudel, Keshab, MD       simvastatin  (ZOCOR ) tablet 20 mg  20 mg Oral Daily Paudel, Keshab, MD   20 mg at 08/17/24 0917     Objective: Vital signs in last 24 hours: Temp:  [97.6 F (36.4 C)-99.7 F (37.6 C)] 97.6 F (36.4 C) (01/21 0802) Pulse Rate:  [86-120] 93 (01/21 0802) Resp:  [16-28] 28 (01/21 0802) BP: (104-151)/(51-113) 145/113 (01/21 0802) SpO2:  [90 %-99 %] 93 % (01/21 0512) Weight:  [69.6 kg] 69.6 kg (01/21 0520)  Intake/Output from previous day: No intake/output data recorded. Intake/Output this shift: No intake/output data recorded.  Physical Exam Vitals reviewed.  Constitutional:      General: She is not in acute distress.    Appearance: She is not ill-appearing, toxic-appearing or diaphoretic.  Pulmonary:     Effort: Pulmonary effort is normal. No respiratory  distress.  Skin:    General: Skin is warm and dry.  Neurological:     Mental Status: She is alert and oriented to person, place, and time.  Psychiatric:        Mood and Affect: Mood normal.        Behavior: Behavior normal.     Lab Results:  Recent Labs    08/17/24 0332 08/18/24 0442  WBC 29.8* 27.2*  HGB 8.5* 9.6*  HCT 25.9* 29.4*  PLT 274 307   BMET Recent Labs    08/17/24 0332 08/18/24 0442  NA 128* 135  K 3.8 3.8  CL 95* 102  CO2 18* 21*  GLUCOSE 91 88  BUN 24* 23  CREATININE 1.74* 1.37*  CALCIUM 8.1* 8.9   PT/INR Recent Labs    08/16/24 1359  LABPROT 15.8*  INR 1.2   Studies/Results: ECHOCARDIOGRAM COMPLETE Result Date: 08/17/2024    ECHOCARDIOGRAM REPORT   Patient Name:   Dawn Dickerson Date of Exam: 08/17/2024 Medical Rec #:  [id number]       Height:       63.0 in Accession #:    [id number]      Weight:       142.0 lb Date of Birth:  [birth year]       BSA:          1.672 m Patient Age:    89 years        BP:           142/77 mmHg Patient Gender: F               HR:           79 bpm. Exam Location:  [facility] Procedure: 2D Echo, Cardiac Doppler and Color Doppler (Both Spectral and Color            Flow Doppler were utilized during procedure). Indications:     CHF  History:         Patient has no prior history of Echocardiogram examinations.                  Risk Factors:Hypertension and Dyslipidemia.  Sonographer:     Philomena Daring Referring Phys:  [id number] XZDYJA PAUDEL Diagnosing Phys: Cara JONETTA Lovelace MD IMPRESSIONS  1. Left ventricular ejection fraction, by estimation, is 70 to 75%. The left ventricle has hyperdynamic function. The left ventricle has no regional wall motion abnormalities. There is mild concentric left ventricular hypertrophy.  Left ventricular diastolic parameters are consistent with Grade I diastolic dysfunction (impaired relaxation).  2. Right ventricular systolic function is normal. The right ventricular size is normal.  3. Left atrial size was mildly  dilated.  4. Right atrial size was mildly dilated.  5. The mitral valve is grossly normal. Mild mitral valve regurgitation.  6. The aortic valve is calcified. Aortic valve regurgitation is moderate. Aortic valve sclerosis/calcification is present, without any evidence of aortic stenosis. FINDINGS  Left Ventricle: Left ventricular ejection fraction, by estimation, is 70 to 75%. The left ventricle has hyperdynamic function. The left ventricle has no regional wall motion abnormalities. Strain was performed and the global longitudinal strain is indeterminate. The left ventricular internal cavity size was normal in size. There is mild concentric left ventricular hypertrophy. Left ventricular diastolic parameters are consistent with Grade I diastolic dysfunction (impaired relaxation). Right Ventricle: The right ventricular size is normal. No increase in right ventricular wall thickness. Right ventricular systolic function is normal. Left Atrium: Left atrial size was mildly dilated. Right Atrium: Right atrial size was mildly dilated. Pericardium: There is no evidence of pericardial effusion. Mitral Valve: The mitral valve is grossly normal. Mild mitral valve regurgitation. Tricuspid Valve: The tricuspid valve is normal in structure. Tricuspid valve regurgitation is mild. Aortic Valve: The aortic valve is calcified. Aortic valve regurgitation is moderate. Aortic regurgitation PHT measures 549 msec. Aortic valve sclerosis/calcification is present, without any evidence of aortic stenosis. Aortic valve mean gradient measures  8.5 mmHg. Aortic valve peak gradient measures 15.6 mmHg. Aortic valve area, by VTI measures 1.75 cm. Pulmonic Valve: The pulmonic valve was not well visualized. Pulmonic valve regurgitation is not visualized. Aorta: The aortic root was not well visualized. IAS/Shunts: No atrial level shunt detected by color flow Doppler. Additional Comments: 3D was performed not requiring image post processing on an  independent workstation and was indeterminate.  LEFT VENTRICLE PLAX 2D LVIDd:         3.80 cm   Diastology LVIDs:         2.20 cm   LV e' medial:    6.31 cm/s LV PW:         1.20 cm   LV E/e' medial:  15.8 LV IVS:        1.30 cm   LV e' lateral:   7.72 cm/s LVOT diam:     1.90 cm   LV E/e' lateral: 13.0 LV SV:         63 LV SV Index:   38 LVOT Area:     2.84 cm  RIGHT VENTRICLE             IVC RV Basal diam:  3.10 cm     IVC diam: 1.90 cm RV Mid diam:    1.90 cm RV S prime:     10.40 cm/s TAPSE (M-mode): 1.8 cm LEFT ATRIUM             Index        RIGHT ATRIUM           Index LA diam:        3.70 cm 2.21 cm/m   RA Area:     13.70 cm LA Vol (A2C):   68.4 ml 40.92 ml/m  RA Volume:   27.70 ml  16.57 ml/m LA Vol (A4C):   53.9 ml 32.24 ml/m LA Biplane Vol: 60.6 ml 36.25 ml/m  AORTIC VALVE AV Area (Vmax):    1.60 cm AV Area (Vmean):  1.53 cm AV Area (VTI):     1.75 cm AV Vmax:           197.25 cm/s AV Vmean:          136.750 cm/s AV VTI:            0.361 m AV Peak Grad:      15.6 mmHg AV Mean Grad:      8.5 mmHg LVOT Vmax:         111.00 cm/s LVOT Vmean:        73.900 cm/s LVOT VTI:          0.222 m LVOT/AV VTI ratio: 0.62 AI PHT:            549 msec  AORTA Ao Root diam: 2.70 cm MITRAL VALVE                TRICUSPID VALVE MV Area (PHT): 3.02 cm     TR Peak grad:   19.4 mmHg MV Decel Time: 251 msec     TR Vmax:        220.00 cm/s MV E velocity: 100.00 cm/s MV A velocity: 112.00 cm/s  SHUNTS MV E/A ratio:  0.89         Systemic VTI:  0.22 m                             Systemic Diam: 1.90 cm Cara JONETTA Lovelace MD Electronically signed by Cara JONETTA Lovelace MD Signature Date/Time: 08/17/2024/2:23:37 PM    Final    CT CHEST ABDOMEN PELVIS WO CONTRAST Result Date: 08/16/2024 CLINICAL DATA:  Sepsis right flank pain dyspnea on exertion EXAM: CT CHEST, ABDOMEN AND PELVIS WITHOUT CONTRAST TECHNIQUE: Multidetector CT imaging of the chest, abdomen and pelvis was performed following the standard protocol without IV  contrast. RADIATION DOSE REDUCTION: This exam was performed according to the departmental dose-optimization program which includes automated exposure control, adjustment of the mA and/or kV according to patient size and/or use of iterative reconstruction technique. COMPARISON:  Chest x-ray 08/16/2024, CT 04/15/2023, 08/11/2023 FINDINGS: CT CHEST FINDINGS Cardiovascular: Limited assessment without intravenous contrast. Moderate aortic atherosclerosis. No aneurysm. Aortic valvular calcification. Coronary vascular calcification. Cardiomegaly. No pericardial effusion Mediastinum/Nodes: Patent trachea. No thyroid mass. No suspicious lymph nodes. Esophagus within normal limits. Lungs/Pleura: Trace right effusion. Mild apical scarring. No consolidation, or pneumothorax. Musculoskeletal: No acute osseous abnormality.  Degenerative changes CT ABDOMEN PELVIS FINDINGS Hepatobiliary: No focal liver abnormality is seen. No gallstones, gallbladder wall thickening, or biliary dilatation. Pancreas: Atrophic. No acute inflammation. Numerous hypodensities throughout the pancreas as was seen on prior imaging. Reference right inferior pancreatic head low-density lesion measuring 20 mm on series 2, image 64, previously 19 mm. These have been present since 2015 Spleen: Normal in size without focal abnormality. Adrenals/Urinary Tract: Adrenal glands are normal. No hydronephrosis on the left. Interim development of moderate severe right hydronephrosis. Perinephric fat stranding on the right. No obstructing stone is seen. The bladder is unremarkable Stomach/Bowel: The stomach is decompressed. There is no dilated small bowel. No acute bowel wall thickening. Diverticular disease of the left colon Vascular/Lymphatic: Aortic atherosclerosis. No enlarged abdominal or pelvic lymph nodes. Reproductive: Uterus and bilateral adnexa are unremarkable. Other: Negative for ascites or free air Musculoskeletal: Degenerative changes.  No acute osseous  abnormality IMPRESSION: 1. Interim development of severe right hydronephrosis and perinephric fat stranding. No obstructing stone is seen. Concern raised for obstructing process at the right  UPJ. Fat stranding is nonspecific but superimposed upper UTI/pyelonephritis not excluded 2. Trace right effusion. 3. Cardiomegaly. 4. Numerous hypodensities/cystic density throughout the pancreas as was seen on prior imaging and present dating back to 2015. No specific imaging follow-up recommended given patient age. 5. Diverticular disease of the left colon without acute inflammatory process. 6. Aortic atherosclerosis. Aortic Atherosclerosis (ICD10-I70.0). Electronically Signed   By: Luke Bun M.D.   On: 08/16/2024 16:50   DG Chest Portable 1 View Result Date: 08/16/2024 CLINICAL DATA:  Hypoxia. EXAM: PORTABLE CHEST 1 VIEW COMPARISON:  04/15/2023. FINDINGS: Low lung volumes. Cardiomegaly. Chronic elevation of the right hemidiaphragm. Bilateral interstitial prominence, most pronounced at the lung bases, suspicious for pulmonary vascular congestion with possible mild interstitial pulmonary edema. Mild right basilar atelectasis. No pneumothorax. No acute osseous abnormality. IMPRESSION: 1. Low lung volumes with bilateral interstitial prominence most pronounced at the lung bases, suspicious for pulmonary vascular congestion with possible mild interstitial edema. 2. Cardiomegaly. 3. Chronic elevation of the right hemidiaphragm with mild right basilar atelectasis. Electronically Signed   By: Harrietta Sherry M.D.   On: 08/16/2024 15:20   Assessment & Plan: 89 y.o. female admitted with sepsis due to E. coli bacteremia/pyelonephritis in the setting of new severe right hydronephrosis.  She is clinically improving with IV antibiotics, however she would like to pursue right ureteral stent placement with Dr. Twylla for maximum urinary decompression, which is reasonable.  I have her added on to Dr. Neale schedule tomorrow  for right ureteral stent placement.  Informed consent orders in.  Please make her n.p.o. at midnight.  Continue antibiotics per primary team in the interim.  Shawntez Dickison, PA-C 08/18/2024

## 2024-08-18 NOTE — Plan of Care (Signed)
  Problem: Pain Managment: Goal: General experience of comfort will improve and/or be controlled Outcome: Progressing   Problem: Safety: Goal: Ability to remain free from injury will improve Outcome: Progressing   Problem: Activity: Goal: Ability to tolerate increased activity will improve Outcome: Progressing

## 2024-08-18 NOTE — Progress Notes (Signed)
 Mobility Specialist - Progress Note  During mobility: HR-109,  SpO2-92%  Post-mobility: HR-100, SPO2-91%   08/18/24 1100  Mobility  Activity Ambulated with assistance;Stood with assistance;Dangled on edge of bed  Level of Assistance Contact guard assist, steadying assist  Assistive Device Front wheel walker  Distance Ambulated (ft) 30 ft  Range of Motion/Exercises Active  Activity Response Tolerated fair  Mobility visit 1 Mobility  Mobility Specialist Start Time (ACUTE ONLY) 1030  Mobility Specialist Stop Time (ACUTE ONLY) 1045  Mobility Specialist Time Calculation (min) (ACUTE ONLY) 15 min   Pt was supine in bed with the HOB elevated on RA with guest in the room upon entry. Pt agreed to mobility. Pt is able today to get to the EOB independently with bed features. Pt is able today to STS with 2 WW. Pt did state a bit of SOB. Pt also described being lethargic today. Pt ambulated well within the room. Pt O2 vitals remained WNL. After activity pt returned to bed with needs in reach and bed alarm on.  Clem Rodes Mobility Specialist 08/18/24, 11:11 AM

## 2024-08-18 NOTE — H&P (View-Only) (Signed)
 Urology Inpatient Progress Note  Subjective: No acute events overnight. She is afebrile, VSS. White count down, 27.2.  Creatinine down, 1.37. Blood cultures growing E. coli; urine culture growing gram-negative rods.  On antibiotics as below. She is accompanied today by her son and daughter-in-law at the bedside.  She reports feeling somewhat better today.  She would like to pursue right ureteral stent placement as discussed with Dr. Twylla yesterday.  Anti-infectives: Anti-infectives (From admission, onward)    Start     Dose/Rate Route Frequency Ordered Stop   08/17/24 2200  cefTRIAXone  (ROCEPHIN ) 2 g in sodium chloride  0.9 % 100 mL IVPB        2 g 200 mL/hr over 30 Minutes Intravenous Every 24 hours 08/17/24 1322     08/16/24 2200  cefTRIAXone  (ROCEPHIN ) 2 g in sodium chloride  0.9 % 100 mL IVPB  Status:  Discontinued        2 g 200 mL/hr over 30 Minutes Intravenous Every 24 hours 08/16/24 1551 08/17/24 1322   08/16/24 1700  azithromycin  (ZITHROMAX ) tablet 500 mg  Status:  Discontinued        500 mg Oral Daily 08/16/24 1654 08/17/24 1426   08/16/24 1600  vancomycin  (VANCOREADY) IVPB 1250 mg/250 mL        1,250 mg 166.7 mL/hr over 90 Minutes Intravenous  Once 08/16/24 1517 08/16/24 1812   08/16/24 1600  azithromycin  (ZITHROMAX ) 500 mg in sodium chloride  0.9 % 250 mL IVPB  Status:  Discontinued        500 mg 250 mL/hr over 60 Minutes Intravenous Every 24 hours 08/16/24 1551 08/16/24 1654   08/16/24 1515  piperacillin -tazobactam (ZOSYN ) IVPB 3.375 g        3.375 g 100 mL/hr over 30 Minutes Intravenous  Once 08/16/24 1511 08/16/24 1606       Current Facility-Administered Medications  Medication Dose Route Frequency Provider Last Rate Last Admin   acetaminophen  (TYLENOL ) tablet 650 mg  650 mg Oral Q6H PRN Paudel, Keshab, MD   650 mg at 08/16/24 1742   Or   acetaminophen  (TYLENOL ) suppository 650 mg  650 mg Rectal Q6H PRN Roann Gouty, MD       aspirin  EC tablet 81 mg  81 mg Oral  Daily Paudel, Keshab, MD   81 mg at 08/17/24 0917   cefTRIAXone  (ROCEPHIN ) 2 g in sodium chloride  0.9 % 100 mL IVPB  2 g Intravenous Q24H Patel, Kishan S, RPH 200 mL/hr at 08/17/24 2103 2 g at 08/17/24 2103   escitalopram  (LEXAPRO ) tablet 10 mg  10 mg Oral Daily Paudel, Gouty, MD   10 mg at 08/17/24 9082   fentaNYL  (SUBLIMAZE ) injection 12.5 mcg  12.5 mcg Intravenous Q2H PRN Roann Gouty, MD       guaiFENesin -dextromethorphan  (ROBITUSSIN DM) 100-10 MG/5ML syrup 5 mL  5 mL Oral Q4H PRN Tariq, Hassan, MD       ipratropium-albuterol  (DUONEB) 0.5-2.5 (3) MG/3ML nebulizer solution 3 mL  3 mL Nebulization Q6H PRN Paudel, Gouty, MD   3 mL at 08/18/24 0850   levothyroxine  (SYNTHROID ) tablet 100 mcg  100 mcg Oral Q0600 Paudel, Keshab, MD   100 mcg at 08/17/24 0552   ondansetron  (ZOFRAN ) tablet 4 mg  4 mg Oral Q6H PRN Paudel, Keshab, MD       Or   ondansetron  (ZOFRAN ) injection 4 mg  4 mg Intravenous Q6H PRN Paudel, Gouty, MD       oxyCODONE  (Oxy IR/ROXICODONE ) immediate release tablet 5 mg  5 mg Oral  Q4H PRN Paudel, Keshab, MD       polyethylene glycol (MIRALAX  / GLYCOLAX ) packet 17 g  17 g Oral Daily PRN Paudel, Nena, MD       simvastatin  (ZOCOR ) tablet 20 mg  20 mg Oral Daily Paudel, Keshab, MD   20 mg at 08/17/24 0917     Objective: Vital signs in last 24 hours: Temp:  [97.6 F (36.4 C)-99.7 F (37.6 C)] 97.6 F (36.4 C) (01/21 0802) Pulse Rate:  [86-120] 93 (01/21 0802) Resp:  [16-28] 28 (01/21 0802) BP: (104-151)/(51-113) 145/113 (01/21 0802) SpO2:  [90 %-99 %] 93 % (01/21 0512) Weight:  [69.6 kg] 69.6 kg (01/21 0520)  Intake/Output from previous day: No intake/output data recorded. Intake/Output this shift: No intake/output data recorded.  Physical Exam Vitals reviewed.  Constitutional:      General: She is not in acute distress.    Appearance: She is not ill-appearing, toxic-appearing or diaphoretic.  Pulmonary:     Effort: Pulmonary effort is normal. No respiratory  distress.  Skin:    General: Skin is warm and dry.  Neurological:     Mental Status: She is alert and oriented to person, place, and time.  Psychiatric:        Mood and Affect: Mood normal.        Behavior: Behavior normal.     Lab Results:  Recent Labs    08/17/24 0332 08/18/24 0442  WBC 29.8* 27.2*  HGB 8.5* 9.6*  HCT 25.9* 29.4*  PLT 274 307   BMET Recent Labs    08/17/24 0332 08/18/24 0442  NA 128* 135  K 3.8 3.8  CL 95* 102  CO2 18* 21*  GLUCOSE 91 88  BUN 24* 23  CREATININE 1.74* 1.37*  CALCIUM 8.1* 8.9   PT/INR Recent Labs    08/16/24 1359  LABPROT 15.8*  INR 1.2   Studies/Results: ECHOCARDIOGRAM COMPLETE Result Date: 08/17/2024    ECHOCARDIOGRAM REPORT   Patient Name:   Dawn Dickerson Date of Exam: 08/17/2024 Medical Rec #:  [id number]       Height:       63.0 in Accession #:    [id number]      Weight:       142.0 lb Date of Birth:  1932/06/16       BSA:          1.672 m Patient Age:    89 years        BP:           142/77 mmHg Patient Gender: F               HR:           79 bpm. Exam Location:  [facility] Procedure: 2D Echo, Cardiac Doppler and Color Doppler (Both Spectral and Color            Flow Doppler were utilized during procedure). Indications:     CHF  History:         Patient has no prior history of Echocardiogram examinations.                  Risk Factors:Hypertension and Dyslipidemia.  Sonographer:     Philomena Daring Referring Phys:  [id number] XZDYJA PAUDEL Diagnosing Phys: Cara JONETTA Lovelace MD IMPRESSIONS  1. Left ventricular ejection fraction, by estimation, is 70 to 75%. The left ventricle has hyperdynamic function. The left ventricle has no regional wall motion abnormalities. There is mild concentric left ventricular hypertrophy.  Left ventricular diastolic parameters are consistent with Grade I diastolic dysfunction (impaired relaxation).  2. Right ventricular systolic function is normal. The right ventricular size is normal.  3. Left atrial size was mildly  dilated.  4. Right atrial size was mildly dilated.  5. The mitral valve is grossly normal. Mild mitral valve regurgitation.  6. The aortic valve is calcified. Aortic valve regurgitation is moderate. Aortic valve sclerosis/calcification is present, without any evidence of aortic stenosis. FINDINGS  Left Ventricle: Left ventricular ejection fraction, by estimation, is 70 to 75%. The left ventricle has hyperdynamic function. The left ventricle has no regional wall motion abnormalities. Strain was performed and the global longitudinal strain is indeterminate. The left ventricular internal cavity size was normal in size. There is mild concentric left ventricular hypertrophy. Left ventricular diastolic parameters are consistent with Grade I diastolic dysfunction (impaired relaxation). Right Ventricle: The right ventricular size is normal. No increase in right ventricular wall thickness. Right ventricular systolic function is normal. Left Atrium: Left atrial size was mildly dilated. Right Atrium: Right atrial size was mildly dilated. Pericardium: There is no evidence of pericardial effusion. Mitral Valve: The mitral valve is grossly normal. Mild mitral valve regurgitation. Tricuspid Valve: The tricuspid valve is normal in structure. Tricuspid valve regurgitation is mild. Aortic Valve: The aortic valve is calcified. Aortic valve regurgitation is moderate. Aortic regurgitation PHT measures 549 msec. Aortic valve sclerosis/calcification is present, without any evidence of aortic stenosis. Aortic valve mean gradient measures  8.5 mmHg. Aortic valve peak gradient measures 15.6 mmHg. Aortic valve area, by VTI measures 1.75 cm. Pulmonic Valve: The pulmonic valve was not well visualized. Pulmonic valve regurgitation is not visualized. Aorta: The aortic root was not well visualized. IAS/Shunts: No atrial level shunt detected by color flow Doppler. Additional Comments: 3D was performed not requiring image post processing on an  independent workstation and was indeterminate.  LEFT VENTRICLE PLAX 2D LVIDd:         3.80 cm   Diastology LVIDs:         2.20 cm   LV e' medial:    6.31 cm/s LV PW:         1.20 cm   LV E/e' medial:  15.8 LV IVS:        1.30 cm   LV e' lateral:   7.72 cm/s LVOT diam:     1.90 cm   LV E/e' lateral: 13.0 LV SV:         63 LV SV Index:   38 LVOT Area:     2.84 cm  RIGHT VENTRICLE             IVC RV Basal diam:  3.10 cm     IVC diam: 1.90 cm RV Mid diam:    1.90 cm RV S prime:     10.40 cm/s TAPSE (M-mode): 1.8 cm LEFT ATRIUM             Index        RIGHT ATRIUM           Index LA diam:        3.70 cm 2.21 cm/m   RA Area:     13.70 cm LA Vol (A2C):   68.4 ml 40.92 ml/m  RA Volume:   27.70 ml  16.57 ml/m LA Vol (A4C):   53.9 ml 32.24 ml/m LA Biplane Vol: 60.6 ml 36.25 ml/m  AORTIC VALVE AV Area (Vmax):    1.60 cm AV Area (Vmean):  1.53 cm AV Area (VTI):     1.75 cm AV Vmax:           197.25 cm/s AV Vmean:          136.750 cm/s AV VTI:            0.361 m AV Peak Grad:      15.6 mmHg AV Mean Grad:      8.5 mmHg LVOT Vmax:         111.00 cm/s LVOT Vmean:        73.900 cm/s LVOT VTI:          0.222 m LVOT/AV VTI ratio: 0.62 AI PHT:            549 msec  AORTA Ao Root diam: 2.70 cm MITRAL VALVE                TRICUSPID VALVE MV Area (PHT): 3.02 cm     TR Peak grad:   19.4 mmHg MV Decel Time: 251 msec     TR Vmax:        220.00 cm/s MV E velocity: 100.00 cm/s MV A velocity: 112.00 cm/s  SHUNTS MV E/A ratio:  0.89         Systemic VTI:  0.22 m                             Systemic Diam: 1.90 cm Cara JONETTA Lovelace MD Electronically signed by Cara JONETTA Lovelace MD Signature Date/Time: 08/17/2024/2:23:37 PM    Final    CT CHEST ABDOMEN PELVIS WO CONTRAST Result Date: 08/16/2024 CLINICAL DATA:  Sepsis right flank pain dyspnea on exertion EXAM: CT CHEST, ABDOMEN AND PELVIS WITHOUT CONTRAST TECHNIQUE: Multidetector CT imaging of the chest, abdomen and pelvis was performed following the standard protocol without IV  contrast. RADIATION DOSE REDUCTION: This exam was performed according to the departmental dose-optimization program which includes automated exposure control, adjustment of the mA and/or kV according to patient size and/or use of iterative reconstruction technique. COMPARISON:  Chest x-ray 08/16/2024, CT 04/15/2023, 08/11/2023 FINDINGS: CT CHEST FINDINGS Cardiovascular: Limited assessment without intravenous contrast. Moderate aortic atherosclerosis. No aneurysm. Aortic valvular calcification. Coronary vascular calcification. Cardiomegaly. No pericardial effusion Mediastinum/Nodes: Patent trachea. No thyroid mass. No suspicious lymph nodes. Esophagus within normal limits. Lungs/Pleura: Trace right effusion. Mild apical scarring. No consolidation, or pneumothorax. Musculoskeletal: No acute osseous abnormality.  Degenerative changes CT ABDOMEN PELVIS FINDINGS Hepatobiliary: No focal liver abnormality is seen. No gallstones, gallbladder wall thickening, or biliary dilatation. Pancreas: Atrophic. No acute inflammation. Numerous hypodensities throughout the pancreas as was seen on prior imaging. Reference right inferior pancreatic head low-density lesion measuring 20 mm on series 2, image 64, previously 19 mm. These have been present since 2015 Spleen: Normal in size without focal abnormality. Adrenals/Urinary Tract: Adrenal glands are normal. No hydronephrosis on the left. Interim development of moderate severe right hydronephrosis. Perinephric fat stranding on the right. No obstructing stone is seen. The bladder is unremarkable Stomach/Bowel: The stomach is decompressed. There is no dilated small bowel. No acute bowel wall thickening. Diverticular disease of the left colon Vascular/Lymphatic: Aortic atherosclerosis. No enlarged abdominal or pelvic lymph nodes. Reproductive: Uterus and bilateral adnexa are unremarkable. Other: Negative for ascites or free air Musculoskeletal: Degenerative changes.  No acute osseous  abnormality IMPRESSION: 1. Interim development of severe right hydronephrosis and perinephric fat stranding. No obstructing stone is seen. Concern raised for obstructing process at the right  UPJ. Fat stranding is nonspecific but superimposed upper UTI/pyelonephritis not excluded 2. Trace right effusion. 3. Cardiomegaly. 4. Numerous hypodensities/cystic density throughout the pancreas as was seen on prior imaging and present dating back to 2015. No specific imaging follow-up recommended given patient age. 5. Diverticular disease of the left colon without acute inflammatory process. 6. Aortic atherosclerosis. Aortic Atherosclerosis (ICD10-I70.0). Electronically Signed   By: Luke Bun M.D.   On: 08/16/2024 16:50   DG Chest Portable 1 View Result Date: 08/16/2024 CLINICAL DATA:  Hypoxia. EXAM: PORTABLE CHEST 1 VIEW COMPARISON:  04/15/2023. FINDINGS: Low lung volumes. Cardiomegaly. Chronic elevation of the right hemidiaphragm. Bilateral interstitial prominence, most pronounced at the lung bases, suspicious for pulmonary vascular congestion with possible mild interstitial pulmonary edema. Mild right basilar atelectasis. No pneumothorax. No acute osseous abnormality. IMPRESSION: 1. Low lung volumes with bilateral interstitial prominence most pronounced at the lung bases, suspicious for pulmonary vascular congestion with possible mild interstitial edema. 2. Cardiomegaly. 3. Chronic elevation of the right hemidiaphragm with mild right basilar atelectasis. Electronically Signed   By: Harrietta Sherry M.D.   On: 08/16/2024 15:20   Assessment & Plan: 89 y.o. female admitted with sepsis due to E. coli bacteremia/pyelonephritis in the setting of new severe right hydronephrosis.  She is clinically improving with IV antibiotics, however she would like to pursue right ureteral stent placement with Dr. Twylla for maximum urinary decompression, which is reasonable.  I have her added on to Dr. Neale schedule tomorrow  for right ureteral stent placement.  Informed consent orders in.  Please make her n.p.o. at midnight.  Continue antibiotics per primary team in the interim.  Brighten Buzzelli, PA-C 08/18/2024

## 2024-08-18 NOTE — Progress Notes (Signed)
" ° ° °  PROCEDURAL EXPEDITER PROGRESS NOTE  Patient Name: Dawn Dickerson  DOB:05-17-1932 Date of Admission: 08/16/2024  Date of Assessment:08/18/24   -------------------------------------------------------------------------------------------------------------------   Brief clinical summary: Pt to OR tomorrow for Right ureteral stent placement   Orders in place:  Yes   Labs, test, and orders reviewed: Y  Requires surgical clearance:  Yes  What type of clearance: No  Barriers noted: N/A   -------------------------------------------------------------------------------------------------------------------  [facility], [place], [place] Please contact us  directly via secure chat (search for [facility]) or by calling us  at [phone] [facility]).  "

## 2024-08-18 NOTE — Progress Notes (Signed)
 " PROGRESS NOTE    Dawn Dickerson  FMW:[id number] DOB: 10/22/1931 DOA: 08/16/2024 PCP: Fernande Ophelia JINNY DOUGLAS, MD  Subjective: No acute events overnight. Seen and examined at bedside with family present. Reports feeling better. Denies any dysuria, hematuria, flank pain. Denies nausea, vomiting, constipation. Remains NPO in anticipation for urologic procedure.   Hospital Course:  89 y.o. 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  Reportedly, when EMS arrived, oxygen saturation was 78% on room air.  She was placed on 15 L oxygen via nonrebreather mask and oxygen came up to 97%.  She was subsequently transported to the ED for further management.   Vital signs in the ED: Temperature 99.6 F (went up to 101 F), respiratory rate 26-30, heart rate 113, oxygen saturation 100% on 15 L via nonrebreather mask.  BP dropped to 96/48. Labs significant for sodium 130, bicarb 17, creatinine 1.78, GFR 27, troponin 464, proBNP greater than 35,000, lactic acid 4.6, WBC 17.9, hemoglobin 8.9.  Admitted for severe sepsis in the setting of acute complicated UTI and presumed bacterial PNA. Hospital course complicated by severe R hydronephrosis and AKI on CKD.   Assessment and Plan:  Severe sepsis Acute complicated UTI  E. coli bacteremia: No evidence of pneumonia on CT chest. Blood culture showed E. coli.  Urine culture is GNRs, final read pending. Lactic acid down from 4.6-2.7. Procalcitonin 100 Continue IV ceftriaxone , duration TBD   Chronic diastolic CHF:  Appears euvolemic.   No Lasix  for now. proBNP greater than 35,000. 2D echo from 08/17/2024 showed EF estimated at  70 to 75% with hyperdynamic LV function, moderate concentric LVH, grade 1 diastolic dysfunction, mildly dilated left and right atria, moderate aortic regurgitation, mild mitral regurgitation and no evidence of left ventricular regional wall motion abnormalities. 2D echo in January 2025 showed EF> 55% Monitor clinically    Severe right hydronephrosis, concern for obstructing process at right UPJ: No clear obstructing stone, urolithiasis or nephrolithiasis seen on CT scanning.  Consulted Dr. Twylla, urologist, to assist with management.  AKI on CKD stage IIIa:  Improving Creatinine 1.37 < 1.74, baseline 1.1-1.2 Continue to monitor I/Os, renal function   Hyponatremia:  Improving Sodium 135 < 128.  Continue to monitor.   Elevated troponins: 464-533.   ACS ruled out. This is likely due to demand ischemia. 2D echo showed preserved EF. Discontinued IV heparin  drip.   Follow-up with cardiologist outpatient.  DVT prophylaxis: SCDs Start: 08/16/24 1550  Lovenox    Code Status: Full Code Family Communication: updated family at bedside Disposition Plan: Home with PT Reason for continuing need for hospitalization: IV antibiotics, urology recommendations  Objective: Vitals:   08/18/24 0512 08/18/24 0520 08/18/24 0802 08/18/24 1213  BP: (!) 130/56  (!) 145/113 131/65  Pulse: 86  93 94  Resp: 16  (!) 28 17  Temp: 98.4 F (36.9 C)  97.6 F (36.4 C) 98 F (36.7 C)  TempSrc:   Oral   SpO2: 93%   94%  Weight:  69.6 kg    Height:       No intake or output data in the 24 hours ending 08/18/24 1312 Filed Weights   08/16/24 1348 08/17/24 0512 08/18/24 0520  Weight: 64 kg 64.4 kg 69.6  kg    Examination:  Physical Exam Vitals and nursing note reviewed.  Constitutional:      General: She is not in acute distress.    Appearance: She is ill-appearing.  HENT:     Head: Normocephalic and atraumatic.  Cardiovascular:     Rate and Rhythm: Normal rate and regular rhythm.     Pulses:  Normal pulses.     Heart sounds: Normal heart sounds.  Pulmonary:     Effort: Pulmonary effort is normal.     Breath sounds: Normal breath sounds.  Abdominal:     General: Bowel sounds are normal.     Palpations: Abdomen is soft.  Neurological:     Mental Status: She is alert.     Data Reviewed: I have personally reviewed following labs and imaging studies  CBC: Recent Labs  Lab 08/16/24 1359 08/17/24 0332 08/18/24 0442  WBC 17.9* 29.8* 27.2*  NEUTROABS 16.8*  --   --   HGB 8.9* 8.5* 9.6*  HCT 27.9* 25.9* 29.4*  MCV 83.0 81.2 81.7  PLT 279 274 307   Basic Metabolic Panel: Recent Labs  Lab 08/16/24 1359 08/17/24 0332 08/18/24 0442  NA 130* 128* 135  K 4.0 3.8 3.8  CL 96* 95* 102  CO2 17* 18* 21*  GLUCOSE 111* 91 88  BUN 18 24* 23  CREATININE 1.78* 1.74* 1.37*  CALCIUM 8.5* 8.1* 8.9   GFR: Estimated Creatinine Clearance: 25.5 mL/min (A) (by C-G formula based on SCr of 1.37 mg/dL (H)). Liver Function Tests: Recent Labs  Lab 08/16/24 1359 08/17/24 0332  AST 66* 73*  ALT <5 <5  ALKPHOS 359* 290*  BILITOT 0.5 0.3  PROT 6.3* 6.1*  ALBUMIN 3.2* 3.0*   No results for input(s): LIPASE, AMYLASE in the last 168 hours. No results for input(s): AMMONIA in the last 168 hours. Coagulation Profile: Recent Labs  Lab 08/16/24 1359  INR 1.2   Cardiac Enzymes: No results for input(s): CKTOTAL, CKMB, CKMBINDEX, TROPONINI in the last 168 hours. ProBNP, BNP (last 5 results) Recent Labs    08/16/24 1359  PROBNP >35,000.0*   HbA1C: No results for input(s): HGBA1C in the last 72 hours. CBG: No results for input(s): GLUCAP in the last 168 hours. Lipid Profile: No results for input(s): CHOL, HDL, LDLCALC, TRIG, CHOLHDL, LDLDIRECT in the last 72 hours. Thyroid Function Tests: No results for input(s): TSH, T4TOTAL, FREET4, T3FREE, THYROIDAB in the last 72 hours. Anemia Panel: No results for input(s): VITAMINB12, FOLATE,  FERRITIN, TIBC, IRON, RETICCTPCT in the last 72 hours. Sepsis Labs: Recent Labs  Lab 08/16/24 1432 08/16/24 1559 08/16/24 1600  PROCALCITON  --  100.00  --   LATICACIDVEN 4.6*  --  2.7*    Recent Results (from the past 240 hours)  Resp panel by RT-PCR (RSV, Flu A&B, Covid) Anterior Nasal Swab     Status: None   Collection Time: 08/16/24  1:59 PM   Specimen: Anterior Nasal Swab  Result Value Ref Range Status   SARS Coronavirus 2 by RT PCR NEGATIVE NEGATIVE Final    Comment: (NOTE) SARS-CoV-2 target nucleic acids are NOT DETECTED.  The SARS-CoV-2 RNA is generally detectable in upper respiratory specimens during the acute phase of infection. The lowest concentration of SARS-CoV-2 viral copies this assay can detect is 138 copies/mL. A negative result does not preclude SARS-Cov-2 infection and should not be used as the sole basis for treatment or other patient management decisions. A negative result may occur with  improper specimen  collection/handling, submission of specimen other than nasopharyngeal swab, presence of viral mutation(s) within the areas targeted by this assay, and inadequate number of viral copies(<138 copies/mL). A negative result must be combined with clinical observations, patient history, and epidemiological information. The expected result is Negative.  Fact Sheet for Patients:  bloggercourse.com  Fact Sheet for Healthcare Providers:  seriousbroker.it  This test is no t yet approved or cleared by the [place]  FDA and  has been authorized for detection and/or diagnosis of SARS-CoV-2 by FDA under an Emergency Use Authorization (EUA). This EUA will remain  in effect (meaning this test can be used) for the duration of the COVID-19 declaration under Section 564(b)(1) of the Act, 21 U.S.C.section 360bbb-3(b)(1), unless the authorization is terminated  or revoked sooner.       Influenza A by PCR  NEGATIVE NEGATIVE Final   Influenza B by PCR NEGATIVE NEGATIVE Final    Comment: (NOTE) The Xpert Xpress SARS-CoV-2/FLU/RSV plus assay is intended as an aid in the diagnosis of influenza from Nasopharyngeal swab specimens and should not be used as a sole basis for treatment. Nasal washings and aspirates are unacceptable for Xpert Xpress SARS-CoV-2/FLU/RSV testing.  Fact Sheet for Patients: bloggercourse.com  Fact Sheet for Healthcare Providers: seriousbroker.it  This test is not yet approved or cleared by the [place]  FDA and has been authorized for detection and/or diagnosis of SARS-CoV-2 by FDA under an Emergency Use Authorization (EUA). This EUA will remain in effect (meaning this test can be used) for the duration of the COVID-19 declaration under Section 564(b)(1) of the Act, 21 U.S.C. section 360bbb-3(b)(1), unless the authorization is terminated or revoked.     Resp Syncytial Virus by PCR NEGATIVE NEGATIVE Final    Comment: (NOTE) Fact Sheet for Patients: bloggercourse.com  Fact Sheet for Healthcare Providers: seriousbroker.it  This test is not yet approved or cleared by the [place]  FDA and has been authorized for detection and/or diagnosis of SARS-CoV-2 by FDA under an Emergency Use Authorization (EUA). This EUA will remain in effect (meaning this test can be used) for the duration of the COVID-19 declaration under Section 564(b)(1) of the Act, 21 U.S.C. section 360bbb-3(b)(1), unless the authorization is terminated or revoked.  Performed at [facility], [address] [place], [place] [address]   Blood Culture (routine x 2)     Status: Abnormal (Preliminary result)   Collection Time: 08/16/24  2:32 PM   Specimen: BLOOD  Result Value Ref Range Status   Specimen Description   Final    BLOOD BLOOD RIGHT ARM Performed at [facility],  [address] [place], [place] [address]    Special Requests   Final    BOTTLES DRAWN AEROBIC AND ANAEROBIC Blood Culture adequate volume Performed at [facility], [address] [place], [place] [address]    Culture  Setup Time   Final    GRAM NEGATIVE RODS IN BOTH AEROBIC AND ANAEROBIC BOTTLES CRITICAL RESULT CALLED TO, READ BACK BY AND VERIFIED WITH:  NATHAN BELUE AT 9964 08/17/24 JG    Culture (A)  Final    ESCHERICHIA COLI SUSCEPTIBILITIES TO FOLLOW Performed at [facility], [address], [place], [place] [address]    Report Status PENDING  Incomplete  Blood Culture (routine x 2)     Status: Abnormal (Preliminary result)   Collection Time: 08/16/24  2:32 PM   Specimen: BLOOD  Result Value Ref Range Status   Specimen Description   Final  BLOOD BLOOD LEFT ARM Performed at [facility], [address] [place], [place] [address]    Special Requests   Final    BOTTLES DRAWN AEROBIC AND ANAEROBIC Blood Culture adequate volume Performed at [facility], [address] [place], [place] [address]    Culture  Setup Time   Final    GRAM NEGATIVE RODS IN BOTH AEROBIC AND ANAEROBIC BOTTLES CRITICAL VALUE NOTED.  VALUE IS CONSISTENT WITH PREVIOUSLY REPORTED AND CALLED VALUE. Performed at [facility], [address] [place], [place] [address]    Culture ESCHERICHIA COLI (A)  Final   Report Status PENDING  Incomplete  Urine Culture     Status: Abnormal   Collection Time: 08/16/24  2:32 PM   Specimen: Urine, Catheterized  Result Value Ref Range Status   Specimen Description   Final    URINE, CATHETERIZED Performed at [facility], [address] [place], [place] [address]    Special Requests   Final    NONE Performed at [facility], [address] [place], [place] [address]    Culture >=100,000 COLONIES/mL ESCHERICHIA COLI (A)  Final   Report Status 08/18/2024 FINAL  Final   Organism ID, Bacteria  ESCHERICHIA COLI (A)  Final      Susceptibility   Escherichia coli - MIC*    AMPICILLIN <=2 SENSITIVE Sensitive     CEFAZOLIN (URINE) Value in next row Sensitive      <=1 SENSITIVEThis is a modified FDA-approved test that has been validated and its performance characteristics determined by the reporting laboratory.  This laboratory is certified under the Clinical Laboratory Improvement Amendments CLIA as qualified to perform high complexity clinical laboratory testing.    CEFEPIME  Value in next row Sensitive      <=1 SENSITIVEThis is a modified FDA-approved test that has been validated and its performance characteristics determined by the reporting laboratory.  This laboratory is certified under the Clinical Laboratory Improvement Amendments CLIA as qualified to perform high complexity clinical laboratory testing.    ERTAPENEM Value in next row Sensitive      <=1 SENSITIVEThis is a modified FDA-approved test that has been validated and its performance characteristics determined by the reporting laboratory.  This laboratory is certified under the Clinical Laboratory Improvement Amendments CLIA as qualified to perform high complexity clinical laboratory testing.    CEFTRIAXONE  Value in next row Sensitive      <=1 SENSITIVEThis is a modified FDA-approved test that has been validated and its performance characteristics determined by the reporting laboratory.  This laboratory is certified under the Clinical Laboratory Improvement Amendments CLIA as qualified to perform high complexity clinical laboratory testing.    CIPROFLOXACIN Value in next row Sensitive      <=1 SENSITIVEThis is a modified FDA-approved test that has been validated and its performance characteristics determined by the reporting laboratory.  This laboratory is certified under the Clinical Laboratory Improvement Amendments CLIA as qualified to perform high complexity clinical laboratory testing.    GENTAMICIN Value in next row Sensitive       <=1 SENSITIVEThis is a modified FDA-approved test that has been validated and its performance characteristics determined by the reporting laboratory.  This laboratory is certified under the Clinical Laboratory Improvement Amendments CLIA as qualified to perform high complexity clinical laboratory testing.    NITROFURANTOIN Value in next row Sensitive      <=1 SENSITIVEThis is a modified FDA-approved test that has been validated and its performance characteristics determined by  the reporting laboratory.  This laboratory is certified under the Clinical Laboratory Improvement Amendments CLIA as qualified to perform high complexity clinical laboratory testing.    TRIMETH /SULFA  Value in next row Sensitive      <=1 SENSITIVEThis is a modified FDA-approved test that has been validated and its performance characteristics determined by the reporting laboratory.  This laboratory is certified under the Clinical Laboratory Improvement Amendments CLIA as qualified to perform high complexity clinical laboratory testing.    AMPICILLIN/SULBACTAM Value in next row Sensitive      <=1 SENSITIVEThis is a modified FDA-approved test that has been validated and its performance characteristics determined by the reporting laboratory.  This laboratory is certified under the Clinical Laboratory Improvement Amendments CLIA as qualified to perform high complexity clinical laboratory testing.    PIP/TAZO Value in next row Sensitive      <=4 SENSITIVEThis is a modified FDA-approved test that has been validated and its performance characteristics determined by the reporting laboratory.  This laboratory is certified under the Clinical Laboratory Improvement Amendments CLIA as qualified to perform high complexity clinical laboratory testing.    MEROPENEM Value in next row Sensitive      <=4 SENSITIVEThis is a modified FDA-approved test that has been validated and its performance characteristics determined by the reporting laboratory.   This laboratory is certified under the Clinical Laboratory Improvement Amendments CLIA as qualified to perform high complexity clinical laboratory testing.    * >=100,000 COLONIES/mL ESCHERICHIA COLI  Blood Culture ID Panel (Reflexed)     Status: Abnormal   Collection Time: 08/16/24  2:32 PM  Result Value Ref Range Status   Enterococcus faecalis NOT DETECTED NOT DETECTED Final   Enterococcus Faecium NOT DETECTED NOT DETECTED Final   Listeria monocytogenes NOT DETECTED NOT DETECTED Final   Staphylococcus species NOT DETECTED NOT DETECTED Final   Staphylococcus aureus (BCID) NOT DETECTED NOT DETECTED Final   Staphylococcus epidermidis NOT DETECTED NOT DETECTED Final   Staphylococcus lugdunensis NOT DETECTED NOT DETECTED Final   Streptococcus species NOT DETECTED NOT DETECTED Final   Streptococcus agalactiae NOT DETECTED NOT DETECTED Final   Streptococcus pneumoniae NOT DETECTED NOT DETECTED Final   Streptococcus pyogenes NOT DETECTED NOT DETECTED Final   A.calcoaceticus-baumannii NOT DETECTED NOT DETECTED Final   Bacteroides fragilis NOT DETECTED NOT DETECTED Final   Enterobacterales DETECTED (A) NOT DETECTED Final    Comment: Enterobacterales represent a large order of gram negative bacteria, not a single organism. CRITICAL RESULT CALLED TO, READ BACK BY AND VERIFIED WITH:  NATHAN BELUE AT 0035 08/17/24 JG    Enterobacter cloacae complex NOT DETECTED NOT DETECTED Final   Escherichia coli DETECTED (A) NOT DETECTED Final    Comment: CRITICAL RESULT CALLED TO, READ BACK BY AND VERIFIED WITH:  NATHAN BELUE AT 0035 08/17/24 JG    Klebsiella aerogenes NOT DETECTED NOT DETECTED Final   Klebsiella oxytoca NOT DETECTED NOT DETECTED Final   Klebsiella pneumoniae NOT DETECTED NOT DETECTED Final   Proteus species NOT DETECTED NOT DETECTED Final   Salmonella species NOT DETECTED NOT DETECTED Final   Serratia marcescens NOT DETECTED NOT DETECTED Final   Haemophilus influenzae NOT DETECTED NOT  DETECTED Final   Neisseria meningitidis NOT DETECTED NOT DETECTED Final   Pseudomonas aeruginosa NOT DETECTED NOT DETECTED Final   Stenotrophomonas maltophilia NOT DETECTED NOT DETECTED Final   Candida albicans NOT DETECTED NOT DETECTED Final   Candida auris NOT DETECTED NOT DETECTED Final   Candida glabrata NOT DETECTED NOT DETECTED Final  Candida krusei NOT DETECTED NOT DETECTED Final   Candida parapsilosis NOT DETECTED NOT DETECTED Final   Candida tropicalis NOT DETECTED NOT DETECTED Final   Cryptococcus neoformans/gattii NOT DETECTED NOT DETECTED Final   CTX-M ESBL NOT DETECTED NOT DETECTED Final   Carbapenem resistance IMP NOT DETECTED NOT DETECTED Final   Carbapenem resistance KPC NOT DETECTED NOT DETECTED Final   Carbapenem resistance NDM NOT DETECTED NOT DETECTED Final   Carbapenem resist OXA 48 LIKE NOT DETECTED NOT DETECTED Final   Carbapenem resistance VIM NOT DETECTED NOT DETECTED Final    Comment: Performed at [facility], [address], [place], [place] [address]     Radiology Studies: ECHOCARDIOGRAM COMPLETE Result Date: 08/17/2024    ECHOCARDIOGRAM REPORT   Patient Name:   Dawn Dickerson Date of Exam: 08/17/2024 Medical Rec #:  [id number]       Height:       63.0 in Accession #:    [id number]      Weight:       142.0 lb Date of Birth:  08-03-1931       BSA:          1.672 m Patient Age:    89 years        BP:           142/77 mmHg Patient Gender: F               HR:           79 bpm. Exam Location:  [facility] Procedure: 2D Echo, Cardiac Doppler and Color Doppler (Both Spectral and Color            Flow Doppler were utilized during procedure). Indications:     CHF  History:         Patient has no prior history of Echocardiogram examinations.                  Risk Factors:Hypertension and Dyslipidemia.  Sonographer:     Philomena Daring Referring Phys:  [id number] PAUDEL Diagnosing Phys: Cara JONETTA Lovelace MD IMPRESSIONS  1. Left ventricular ejection fraction, by estimation,  is 70 to 75%. The left ventricle has hyperdynamic function. The left ventricle has no regional wall motion abnormalities. There is mild concentric left ventricular hypertrophy. Left ventricular diastolic parameters are consistent with Grade I diastolic dysfunction (impaired relaxation).  2. Right ventricular systolic function is normal. The right ventricular size is normal.  3. Left atrial size was mildly dilated.  4. Right atrial size was mildly dilated.  5. The mitral valve is grossly normal. Mild mitral valve regurgitation.  6. The aortic valve is calcified. Aortic valve regurgitation is moderate. Aortic valve sclerosis/calcification is present, without any evidence of aortic stenosis. FINDINGS  Left Ventricle: Left ventricular ejection fraction, by estimation, is 70 to 75%. The left ventricle has hyperdynamic function. The left ventricle has no regional wall motion abnormalities. Strain was performed and the global longitudinal strain is indeterminate. The left ventricular internal cavity size was normal in size. There is mild concentric left ventricular hypertrophy. Left ventricular diastolic parameters are consistent with Grade I diastolic dysfunction (impaired relaxation). Right Ventricle: The right ventricular size is normal. No increase in right ventricular wall thickness. Right ventricular systolic function is normal. Left Atrium: Left atrial size was mildly dilated. Right Atrium: Right atrial size was mildly dilated. Pericardium: There is no evidence of pericardial effusion. Mitral Valve: The mitral valve is grossly normal. Mild mitral valve regurgitation. Tricuspid Valve:  The tricuspid valve is normal in structure. Tricuspid valve regurgitation is mild. Aortic Valve: The aortic valve is calcified. Aortic valve regurgitation is moderate. Aortic regurgitation PHT measures 549 msec. Aortic valve sclerosis/calcification is present, without any evidence of aortic stenosis. Aortic valve mean gradient measures   8.5 mmHg. Aortic valve peak gradient measures 15.6 mmHg. Aortic valve area, by VTI measures 1.75 cm. Pulmonic Valve: The pulmonic valve was not well visualized. Pulmonic valve regurgitation is not visualized. Aorta: The aortic root was not well visualized. IAS/Shunts: No atrial level shunt detected by color flow Doppler. Additional Comments: 3D was performed not requiring image post processing on an independent workstation and was indeterminate.  LEFT VENTRICLE PLAX 2D LVIDd:         3.80 cm   Diastology LVIDs:         2.20 cm   LV e' medial:    6.31 cm/s LV PW:         1.20 cm   LV E/e' medial:  15.8 LV IVS:        1.30 cm   LV e' lateral:   7.72 cm/s LVOT diam:     1.90 cm   LV E/e' lateral: 13.0 LV SV:         63 LV SV Index:   38 LVOT Area:     2.84 cm  RIGHT VENTRICLE             IVC RV Basal diam:  3.10 cm     IVC diam: 1.90 cm RV Mid diam:    1.90 cm RV S prime:     10.40 cm/s TAPSE (M-mode): 1.8 cm LEFT ATRIUM             Index        RIGHT ATRIUM           Index LA diam:        3.70 cm 2.21 cm/m   RA Area:     13.70 cm LA Vol (A2C):   68.4 ml 40.92 ml/m  RA Volume:   27.70 ml  16.57 ml/m LA Vol (A4C):   53.9 ml 32.24 ml/m LA Biplane Vol: 60.6 ml 36.25 ml/m  AORTIC VALVE AV Area (Vmax):    1.60 cm AV Area (Vmean):   1.53 cm AV Area (VTI):     1.75 cm AV Vmax:           197.25 cm/s AV Vmean:          136.750 cm/s AV VTI:            0.361 m AV Peak Grad:      15.6 mmHg AV Mean Grad:      8.5 mmHg LVOT Vmax:         111.00 cm/s LVOT Vmean:        73.900 cm/s LVOT VTI:          0.222 m LVOT/AV VTI ratio: 0.62 AI PHT:            549 msec  AORTA Ao Root diam: 2.70 cm MITRAL VALVE                TRICUSPID VALVE MV Area (PHT): 3.02 cm     TR Peak grad:   19.4 mmHg MV Decel Time: 251 msec     TR Vmax:        220.00 cm/s MV E velocity: 100.00 cm/s MV A velocity: 112.00 cm/s  SHUNTS MV E/A ratio:  0.89  Systemic VTI:  0.22 m                             Systemic Diam: 1.90 cm Cara JONETTA Lovelace MD  Electronically signed by Cara JONETTA Lovelace MD Signature Date/Time: 08/17/2024/2:23:37 PM    Final    CT CHEST ABDOMEN PELVIS WO CONTRAST Result Date: 08/16/2024 CLINICAL DATA:  Sepsis right flank pain dyspnea on exertion EXAM: CT CHEST, ABDOMEN AND PELVIS WITHOUT CONTRAST TECHNIQUE: Multidetector CT imaging of the chest, abdomen and pelvis was performed following the standard protocol without IV contrast. RADIATION DOSE REDUCTION: This exam was performed according to the departmental dose-optimization program which includes automated exposure control, adjustment of the mA and/or kV according to patient size and/or use of iterative reconstruction technique. COMPARISON:  Chest x-ray 08/16/2024, CT 04/15/2023, 08/11/2023 FINDINGS: CT CHEST FINDINGS Cardiovascular: Limited assessment without intravenous contrast. Moderate aortic atherosclerosis. No aneurysm. Aortic valvular calcification. Coronary vascular calcification. Cardiomegaly. No pericardial effusion Mediastinum/Nodes: Patent trachea. No thyroid mass. No suspicious lymph nodes. Esophagus within normal limits. Lungs/Pleura: Trace right effusion. Mild apical scarring. No consolidation, or pneumothorax. Musculoskeletal: No acute osseous abnormality.  Degenerative changes CT ABDOMEN PELVIS FINDINGS Hepatobiliary: No focal liver abnormality is seen. No gallstones, gallbladder wall thickening, or biliary dilatation. Pancreas: Atrophic. No acute inflammation. Numerous hypodensities throughout the pancreas as was seen on prior imaging. Reference right inferior pancreatic head low-density lesion measuring 20 mm on series 2, image 64, previously 19 mm. These have been present since 2015 Spleen: Normal in size without focal abnormality. Adrenals/Urinary Tract: Adrenal glands are normal. No hydronephrosis on the left. Interim development of moderate severe right hydronephrosis. Perinephric fat stranding on the right. No obstructing stone is seen. The bladder is  unremarkable Stomach/Bowel: The stomach is decompressed. There is no dilated small bowel. No acute bowel wall thickening. Diverticular disease of the left colon Vascular/Lymphatic: Aortic atherosclerosis. No enlarged abdominal or pelvic lymph nodes. Reproductive: Uterus and bilateral adnexa are unremarkable. Other: Negative for ascites or free air Musculoskeletal: Degenerative changes.  No acute osseous abnormality IMPRESSION: 1. Interim development of severe right hydronephrosis and perinephric fat stranding. No obstructing stone is seen. Concern raised for obstructing process at the right UPJ. Fat stranding is nonspecific but superimposed upper UTI/pyelonephritis not excluded 2. Trace right effusion. 3. Cardiomegaly. 4. Numerous hypodensities/cystic density throughout the pancreas as was seen on prior imaging and present dating back to 2015. No specific imaging follow-up recommended given patient age. 5. Diverticular disease of the left colon without acute inflammatory process. 6. Aortic atherosclerosis. Aortic Atherosclerosis (ICD10-I70.0). Electronically Signed   By: Luke Bun M.D.   On: 08/16/2024 16:50   DG Chest Portable 1 View Result Date: 08/16/2024 CLINICAL DATA:  Hypoxia. EXAM: PORTABLE CHEST 1 VIEW COMPARISON:  04/15/2023. FINDINGS: Low lung volumes. Cardiomegaly. Chronic elevation of the right hemidiaphragm. Bilateral interstitial prominence, most pronounced at the lung bases, suspicious for pulmonary vascular congestion with possible mild interstitial pulmonary edema. Mild right basilar atelectasis. No pneumothorax. No acute osseous abnormality. IMPRESSION: 1. Low lung volumes with bilateral interstitial prominence most pronounced at the lung bases, suspicious for pulmonary vascular congestion with possible mild interstitial edema. 2. Cardiomegaly. 3. Chronic elevation of the right hemidiaphragm with mild right basilar atelectasis. Electronically Signed   By: Harrietta Sherry M.D.   On:  08/16/2024 15:20    Scheduled Meds:  aspirin  EC  81 mg Oral Daily   escitalopram   10 mg Oral Daily  levothyroxine   100 mcg Oral Q0600   simvastatin   20 mg Oral Daily   Continuous Infusions:  cefTRIAXone  (ROCEPHIN )  IV 2 g (08/17/24 2103)     LOS: 2 days   Norval Bar, MD  [facility]  08/18/2024, 1:12 PM   "

## 2024-08-18 NOTE — Progress Notes (Signed)
 Heart Failure Navigator Progress Note  Assessed for Heart & Vascular TOC clinic readiness.  Patient does not meet criteria due to current [facility] Cardiology consult.  Navigator will sign off at this time.  Charmaine Pines, RN, BSN [facility] Heart Failure Navigator Secure Chat Only

## 2024-08-19 ENCOUNTER — Inpatient Hospital Stay: Admitting: Anesthesiology

## 2024-08-19 ENCOUNTER — Encounter: Payer: Self-pay | Admitting: Hospitalist

## 2024-08-19 ENCOUNTER — Inpatient Hospital Stay

## 2024-08-19 ENCOUNTER — Encounter: Admission: EM | Disposition: A | Discharge status: Home / Self Care | Payer: Self-pay | Source: Ambulatory Visit

## 2024-08-19 DIAGNOSIS — N136 Pyonephrosis: Secondary | ICD-10-CM | POA: Diagnosis not present

## 2024-08-19 LAB — BASIC METABOLIC PANEL WITH GFR
Anion gap: 11 (ref 5–15)
BUN: 17 mg/dL (ref 8–23)
CO2: 23 mmol/L (ref 22–32)
Calcium: 8.5 mg/dL — ABNORMAL LOW (ref 8.9–10.3)
Chloride: 104 mmol/L (ref 98–111)
Creatinine, Ser: 1.16 mg/dL — ABNORMAL HIGH (ref 0.44–1.00)
GFR, Estimated: 45 mL/min — ABNORMAL LOW
Glucose, Bld: 90 mg/dL (ref 70–99)
Potassium: 3.7 mmol/L (ref 3.5–5.1)
Sodium: 137 mmol/L (ref 135–145)

## 2024-08-19 LAB — CBC
HCT: 28 % — ABNORMAL LOW (ref 36.0–46.0)
Hemoglobin: 9.1 g/dL — ABNORMAL LOW (ref 12.0–15.0)
MCH: 26.5 pg (ref 26.0–34.0)
MCHC: 32.5 g/dL (ref 30.0–36.0)
MCV: 81.4 fL (ref 80.0–100.0)
Platelets: 271 K/uL (ref 150–400)
RBC: 3.44 MIL/uL — ABNORMAL LOW (ref 3.87–5.11)
RDW: 14.7 % (ref 11.5–15.5)
WBC: 16.8 K/uL — ABNORMAL HIGH (ref 4.0–10.5)
nRBC: 0 % (ref 0.0–0.2)

## 2024-08-19 LAB — CULTURE, BLOOD (ROUTINE X 2)
Special Requests: ADEQUATE
Special Requests: ADEQUATE

## 2024-08-19 MED ORDER — SODIUM CHLORIDE 0.9 % IV SOLN: INTRAVENOUS | Status: DC | PRN

## 2024-08-19 MED ORDER — STERILE WATER FOR IRRIGATION IR SOLN
Status: DC | PRN
  Administered 2024-08-19: 500 mL

## 2024-08-19 MED ORDER — PROPOFOL 500 MG/50ML IV EMUL
INTRAVENOUS | Status: DC | PRN
  Administered 2024-08-19: 150 ug/kg/min via INTRAVENOUS
  Administered 2024-08-19: 30 mg via INTRAVENOUS

## 2024-08-19 MED ORDER — ACETAMINOPHEN 10 MG/ML IV SOLN
INTRAVENOUS | Status: AC
  Filled 2024-08-19: qty 100

## 2024-08-19 MED ORDER — SUCCINYLCHOLINE CHLORIDE 200 MG/10ML IV SOSY
PREFILLED_SYRINGE | INTRAVENOUS | Status: DC | PRN
  Administered 2024-08-19: 20 mg via INTRAVENOUS

## 2024-08-19 MED ORDER — LIDOCAINE HCL (CARDIAC) PF 100 MG/5ML IV SOSY
PREFILLED_SYRINGE | INTRAVENOUS | Status: DC | PRN
  Administered 2024-08-19: 60 mg via INTRAVENOUS

## 2024-08-19 MED ORDER — OXYCODONE HCL 5 MG PO TABS: 5.0000 mg | ORAL_TABLET | Freq: Once | ORAL | Status: DC | PRN

## 2024-08-19 MED ORDER — FENTANYL CITRATE (PF) 100 MCG/2ML IJ SOLN: 25.0000 ug | INTRAMUSCULAR | Status: DC | PRN

## 2024-08-19 MED ORDER — SODIUM CHLORIDE 0.9 % IR SOLN
Status: DC | PRN
  Administered 2024-08-19: 3000 mL

## 2024-08-19 MED ORDER — IPRATROPIUM-ALBUTEROL 0.5-2.5 (3) MG/3ML IN SOLN
3.0000 mL | Freq: Once | RESPIRATORY_TRACT | Status: AC
  Administered 2024-08-19: 3 mL via RESPIRATORY_TRACT

## 2024-08-19 MED ORDER — PHENYLEPHRINE 80 MCG/ML (10ML) SYRINGE FOR IV PUSH (FOR BLOOD PRESSURE SUPPORT)
PREFILLED_SYRINGE | INTRAVENOUS | Status: AC
  Filled 2024-08-19: qty 10

## 2024-08-19 MED ORDER — DROPERIDOL 2.5 MG/ML IJ SOLN: 0.6250 mg | Freq: Once | INTRAMUSCULAR | Status: DC | PRN

## 2024-08-19 MED ORDER — LIDOCAINE HCL (PF) 2 % IJ SOLN
INTRAMUSCULAR | Status: AC
  Filled 2024-08-19: qty 5

## 2024-08-19 MED ORDER — ACETAMINOPHEN 10 MG/ML IV SOLN
1000.0000 mg | Freq: Once | INTRAVENOUS | Status: DC | PRN
  Administered 2024-08-19: 1000 mg via INTRAVENOUS

## 2024-08-19 MED ORDER — SUCCINYLCHOLINE CHLORIDE 200 MG/10ML IV SOSY
PREFILLED_SYRINGE | INTRAVENOUS | Status: AC
  Filled 2024-08-19: qty 10

## 2024-08-19 MED ORDER — IOHEXOL 180 MG/ML  SOLN
INTRAMUSCULAR | Status: DC | PRN
  Administered 2024-08-19 (×2): 10 mL

## 2024-08-19 MED ORDER — ONDANSETRON HCL 4 MG/2ML IJ SOLN
INTRAMUSCULAR | Status: DC | PRN
  Administered 2024-08-19: 4 mg via INTRAVENOUS

## 2024-08-19 MED ORDER — OXYCODONE HCL 5 MG/5ML PO SOLN: 5.0000 mg | Freq: Once | ORAL | Status: DC | PRN

## 2024-08-19 MED ORDER — PHENYLEPHRINE 80 MCG/ML (10ML) SYRINGE FOR IV PUSH (FOR BLOOD PRESSURE SUPPORT)
PREFILLED_SYRINGE | INTRAVENOUS | Status: DC | PRN
  Administered 2024-08-19: 160 ug via INTRAVENOUS
  Administered 2024-08-19: 240 ug via INTRAVENOUS
  Administered 2024-08-19: 160 ug via INTRAVENOUS
  Administered 2024-08-19 (×2): 240 ug via INTRAVENOUS
  Administered 2024-08-19 (×2): 160 ug via INTRAVENOUS

## 2024-08-19 MED ORDER — ONDANSETRON HCL 4 MG/2ML IJ SOLN
INTRAMUSCULAR | Status: AC
  Filled 2024-08-19: qty 2

## 2024-08-19 MED ORDER — IPRATROPIUM-ALBUTEROL 0.5-2.5 (3) MG/3ML IN SOLN
RESPIRATORY_TRACT | Status: AC
  Filled 2024-08-19: qty 3

## 2024-08-19 NOTE — Progress Notes (Signed)
 Patient ID: Dawn Dickerson, female   DOB: [birth year], 89 y.o.   MRN: [id number] [facility]    SUBJECTIVE: Patient resting comfortably in bed asleep denies any fever no worsening shortness of breath no pain.  Patient still feels somewhat weak denies any significant peripheral swelling   Vitals:   08/19/24 0510 08/19/24 0837 08/19/24 0911 08/19/24 1143  BP: (!) 162/61 (!) 142/72 (!) 164/66 (!) 123/57  Pulse: 97 95 92 89  Resp: 20 (!) 22  20  Temp: 97.6 F (36.4 C) 98.8 F (37.1 C) 98.4 F (36.9 C) (!) 97.4 F (36.3 C)  TempSrc:  Oral Oral   SpO2: 92% 92% 92% 91%  Weight:   67.7 kg   Height:   5' 3 (1.6 m)      Intake/Output Summary (Last 24 hours) at 08/19/2024 1219 Last data filed at 08/19/2024 1142 Gross per 24 hour  Intake 1216 ml  Output 2 ml  Net 1214 ml      PHYSICAL EXAM  General: Well developed, well nourished, in no acute distress HEENT:  Normocephalic and atramatic Neck:  No JVD.  Lungs: Clear bilaterally to auscultation and percussion. Heart: Tachycardic. Normal S1 and S2 without gallops or murmurs.  Abdomen: Bowel sounds are positive, abdomen soft and non-tender  Msk:  Back normal, normal gait. Normal strength and tone for age. Extremities: No clubbing, cyanosis or edema.   Neuro: Alert and oriented X 3. Psych:  Good affect, responds appropriately   LABS: Basic Metabolic Panel: Recent Labs    08/18/24 0442 08/19/24 0347  NA 135 137  K 3.8 3.7  CL 102 104  CO2 21* 23  GLUCOSE 88 90  BUN 23 17  CREATININE 1.37* 1.16*  CALCIUM 8.9 8.5*   Liver Function Tests: Recent Labs    08/16/24 1359 08/17/24 0332  AST 66* 73*  ALT <5 <5  ALKPHOS 359* 290*  BILITOT 0.5 0.3  PROT 6.3* 6.1*  ALBUMIN 3.2* 3.0*   No results for input(s): LIPASE, AMYLASE in the last 72 hours. CBC: Recent Labs    08/16/24 1359 08/17/24 0332 08/18/24 0442 08/19/24 0347  WBC 17.9*   < > 27.2* 16.8*  NEUTROABS 16.8*  --   --   --   HGB 8.9*   < > 9.6* 9.1*   HCT 27.9*   < > 29.4* 28.0*  MCV 83.0   < > 81.7 81.4  PLT 279   < > 307 271   < > = values in this interval not displayed.   Cardiac Enzymes: No results for input(s): CKTOTAL, CKMB, CKMBINDEX, TROPONINI in the last 72 hours. BNP: Invalid input(s): POCBNP D-Dimer: No results for input(s): DDIMER in the last 72 hours. Hemoglobin A1C: No results for input(s): HGBA1C in the last 72 hours. Fasting Lipid Panel: No results for input(s): CHOL, HDL, LDLCALC, TRIG, CHOLHDL, LDLDIRECT in the last 72 hours. Thyroid Function Tests: No results for input(s): TSH, T4TOTAL, T3FREE, THYROIDAB in the last 72 hours.  Invalid input(s): FREET3 Anemia Panel: No results for input(s): VITAMINB12, FOLATE, FERRITIN, TIBC, IRON, RETICCTPCT in the last 72 hours.  DG OR UROLOGY CYSTO IMAGE ([facility] ONLY) Result Date: 08/19/2024 There is no interpretation for this exam.  This order is for images obtained during a surgical procedure.  Please See Surgeries Tab for more information regarding the procedure.   ECHOCARDIOGRAM COMPLETE Result Date: 08/17/2024    ECHOCARDIOGRAM REPORT   Patient Name:   Dawn Dickerson Date of Exam: 08/17/2024 Medical Rec #:  [id number]       Height:       63.0 in Accession #:    [id number]      Weight:       142.0 lb Date of Birth:  24-Jan-1932       BSA:          1.672 m Patient Age:    89 years        BP:           142/77 mmHg Patient Gender: F               HR:           79 bpm. Exam Location:  [facility] Procedure: 2D Echo, Cardiac Doppler and Color Doppler (Both Spectral and Color            Flow Doppler were utilized during procedure). Indications:     CHF  History:         Patient has no prior history of Echocardiogram examinations.                  Risk Factors:Hypertension and Dyslipidemia.  Sonographer:     Philomena Daring Referring Phys:  [id number] XZDYJA PAUDEL Diagnosing Phys: Cara JONETTA Lovelace MD IMPRESSIONS  1. Left ventricular ejection  fraction, by estimation, is 70 to 75%. The left ventricle has hyperdynamic function. The left ventricle has no regional wall motion abnormalities. There is mild concentric left ventricular hypertrophy. Left ventricular diastolic parameters are consistent with Grade I diastolic dysfunction (impaired relaxation).  2. Right ventricular systolic function is normal. The right ventricular size is normal.  3. Left atrial size was mildly dilated.  4. Right atrial size was mildly dilated.  5. The mitral valve is grossly normal. Mild mitral valve regurgitation.  6. The aortic valve is calcified. Aortic valve regurgitation is moderate. Aortic valve sclerosis/calcification is present, without any evidence of aortic stenosis. FINDINGS  Left Ventricle: Left ventricular ejection fraction, by estimation, is 70 to 75%. The left ventricle has hyperdynamic function. The left ventricle has no regional wall motion abnormalities. Strain was performed and the global longitudinal strain is indeterminate. The left ventricular internal cavity size was normal in size. There is mild concentric left ventricular hypertrophy. Left ventricular diastolic parameters are consistent with Grade I diastolic dysfunction (impaired relaxation). Right Ventricle: The right ventricular size is normal. No increase in right ventricular wall thickness. Right ventricular systolic function is normal. Left Atrium: Left atrial size was mildly dilated. Right Atrium: Right atrial size was mildly dilated. Pericardium: There is no evidence of pericardial effusion. Mitral Valve: The mitral valve is grossly normal. Mild mitral valve regurgitation. Tricuspid Valve: The tricuspid valve is normal in structure. Tricuspid valve regurgitation is mild. Aortic Valve: The aortic valve is calcified. Aortic valve regurgitation is moderate. Aortic regurgitation PHT measures 549 msec. Aortic valve sclerosis/calcification is present, without any evidence of aortic stenosis. Aortic  valve mean gradient measures  8.5 mmHg. Aortic valve peak gradient measures 15.6 mmHg. Aortic valve area, by VTI measures 1.75 cm. Pulmonic Valve: The pulmonic valve was not well visualized. Pulmonic valve regurgitation is not visualized. Aorta: The aortic root was not well visualized. IAS/Shunts: No atrial level shunt detected by color flow Doppler. Additional Comments: 3D was performed not requiring image post processing on an independent workstation and was indeterminate.  LEFT VENTRICLE PLAX 2D LVIDd:         3.80 cm   Diastology LVIDs:         2.20  cm   LV e' medial:    6.31 cm/s LV PW:         1.20 cm   LV E/e' medial:  15.8 LV IVS:        1.30 cm   LV e' lateral:   7.72 cm/s LVOT diam:     1.90 cm   LV E/e' lateral: 13.0 LV SV:         63 LV SV Index:   38 LVOT Area:     2.84 cm  RIGHT VENTRICLE             IVC RV Basal diam:  3.10 cm     IVC diam: 1.90 cm RV Mid diam:    1.90 cm RV S prime:     10.40 cm/s TAPSE (M-mode): 1.8 cm LEFT ATRIUM             Index        RIGHT ATRIUM           Index LA diam:        3.70 cm 2.21 cm/m   RA Area:     13.70 cm LA Vol (A2C):   68.4 ml 40.92 ml/m  RA Volume:   27.70 ml  16.57 ml/m LA Vol (A4C):   53.9 ml 32.24 ml/m LA Biplane Vol: 60.6 ml 36.25 ml/m  AORTIC VALVE AV Area (Vmax):    1.60 cm AV Area (Vmean):   1.53 cm AV Area (VTI):     1.75 cm AV Vmax:           197.25 cm/s AV Vmean:          136.750 cm/s AV VTI:            0.361 m AV Peak Grad:      15.6 mmHg AV Mean Grad:      8.5 mmHg LVOT Vmax:         111.00 cm/s LVOT Vmean:        73.900 cm/s LVOT VTI:          0.222 m LVOT/AV VTI ratio: 0.62 AI PHT:            549 msec  AORTA Ao Root diam: 2.70 cm MITRAL VALVE                TRICUSPID VALVE MV Area (PHT): 3.02 cm     TR Peak grad:   19.4 mmHg MV Decel Time: 251 msec     TR Vmax:        220.00 cm/s MV E velocity: 100.00 cm/s MV A velocity: 112.00 cm/s  SHUNTS MV E/A ratio:  0.89         Systemic VTI:  0.22 m                             Systemic Diam:  1.90 cm Sharifa Bucholz D Baird Polinski MD Electronically signed by Cara JONETTA Lovelace MD Signature Date/Time: 08/17/2024/2:23:37 PM    Final      Echo preserved left ventricular function EF of at least 70%  TELEMETRY: Sinus rhythm nonspecific ST-T wave changes: Rate of around 100  ASSESSMENT AND PLAN:  Principal Problem:   Severe sepsis (HCC) Active Problems:   HTN (hypertension)   Hyperlipidemia   Hypothyroidism   UTI (urinary tract infection)   AKI (acute kidney injury)   E coli bacteremia   Hydronephrosis of right kidney   Hyponatremia  Plan Continue broad-spectrum antibiotic therapy for sepsis pneumonia Hypertension management and control Status post urological procedure with Dr. Hyla of for complex urinary tract infection Elevated white count related to pneumonia and urinary tract infection History of diastolic dysfunction reasonably stable continue conservative management Hypoxemia related to pneumonia supplemental oxygen inhalers as necessary antibiotics Elevated troponin consistent with demand ischemia rather than ACS Elevated BNP 35,000 consistent with diastolic heart failure continue diuretics and supportive care management.  Do not recommend SGLT2 because of urinary tract infection Fluid resuscitation for mild hypotension and sepsis Echocardiogram with preserved overall left ventricular function Consider outpatient cardiology follow-up for diastolic heart failure after discharge  Cara JONETTA Lovelace, MD, 08/19/2024 12:19 PM

## 2024-08-19 NOTE — Interval H&P Note (Signed)
 History and Physical Interval Note:  08/19/2024 10:02 AM  Dawn Dickerson  has presented today for surgery, with the diagnosis of right ureteral calculus.  The various methods of treatment have been discussed with the patient and family. After consideration of risks, benefits and other options for treatment, the patient has consented to  Procedures: CYSTOSCOPY, WITH STENT INSERTION (Right) as a surgical intervention.  The patient's history has been reviewed, patient examined, no change in status, stable for surgery.  I have reviewed the patient's chart and labs.  Questions were answered to the patient's satisfaction.     Jamielyn Petrucci C Katheryn Culliton

## 2024-08-19 NOTE — Progress Notes (Signed)
 " PROGRESS NOTE    Dawn Dickerson  FMW:[id number] DOB: March 16, 1932 DOA: 08/16/2024 PCP: Fernande Ophelia JINNY DOUGLAS, MD  Subjective: No acute events overnight. Seen and examined at bedside after urologic procedure. Appears sleep at present. Reports tolerating procedure well. Reports having urge to urinate. Denies nausea, vomiting, constipation.   Hospital Course:  89 y.o. 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  Reportedly, when EMS arrived, oxygen saturation was 78% on room air.  She was placed on 15 L oxygen via nonrebreather mask and oxygen came up to 97%.  She was subsequently transported to the ED for further management.   Vital signs in the ED: Temperature 99.6 F (went up to 101 F), respiratory rate 26-30, heart rate 113, oxygen saturation 100% on 15 L via nonrebreather mask.  BP dropped to 96/48. Labs significant for sodium 130, bicarb 17, creatinine 1.78, GFR 27, troponin 464, proBNP greater than 35,000, lactic acid 4.6, WBC 17.9, hemoglobin 8.9.   Admitted for severe sepsis in the setting of acute complicated UTI and presumed bacterial PNA. Hospital course complicated by severe R hydronephrosis and AKI on CKD.   Assessment and Plan:  Severe sepsis Acute complicated UTI  E. coli bacteremia: No evidence of pneumonia on CT chest. Status post R ureteral stent placement 1/22 Blood culture showed E. coli.  Urine culture is E. Coli recently resistant to nitrofurantoin and ampicillin Lactic acid down from 4.6->2.7. Procalcitonin 100 Continue IV ceftriaxone , duration TBD  Severe right hydronephrosis Concern for obstructing process at right UPJ, no clear  obstructing stone, urolithiasis or nephrolithiasis seen on CT scanning.   Status post R ureteral stent placement 1/22 Urology following    Chronic diastolic CHF:  Appears euvolemic.   No Lasix  for now. proBNP greater than 35,000. 2D echo from 08/17/2024 showed EF estimated at 70 to 75% with hyperdynamic LV function, moderate concentric LVH, grade 1 diastolic dysfunction, mildly dilated left and right atria, moderate aortic regurgitation, mild mitral regurgitation and no evidence of left ventricular regional wall motion abnormalities. 2D echo in January 2025 showed EF> 55% Monitor clinically    AKI on CKD stage IIIa:  Resolved Likely in the setting of complicated UTI with R hydronephrosis Creatinine 1.16 < 1.74, baseline 1.1-1.2 Continue to monitor I/Os, renal function   Hyponatremia:  Improving Sodium 135 < 128.  Continue to monitor.   Elevated troponins: 464-533.   ACS ruled out. This is likely due to demand ischemia. 2D echo showed preserved EF. Discontinued IV heparin  drip.   Follow-up with cardiologist outpatient.  DVT prophylaxis: enoxaparin  (LOVENOX ) injection 30 mg Start: 08/18/24 1500 SCDs Start: 08/18/24 1321  Lovenox    Code Status: Full Code Family Communication: updated family at bedside Disposition Plan: Home with PT Reason for continuing need for hospitalization: monitor for clinical improvement  Objective: Vitals:   08/19/24 1200 08/19/24 1215 08/19/24 1228 08/19/24 1426  BP: (!) 154/67 (!) 160/72  (!) 158/58  Pulse: 82 86 95 91  Resp: (!) 22 (!) 24 (!) 27 (!) 22  Temp:   98.7 F (37.1 C) 98.9 F (37.2 C)  TempSrc:      SpO2: 100% 96% 94% (!) 89%  Weight:      Height:  Intake/Output Summary (Last 24 hours) at 08/19/2024 1440 Last data filed at 08/19/2024 1224 Gross per 24 hour  Intake 1196 ml  Output 2 ml  Net 1194 ml   Filed Weights   08/18/24 0520 08/19/24 0500 08/19/24 0911  Weight: 69.6 kg 67.7 kg 67.7 kg     Examination:  Physical Exam Vitals and nursing note reviewed.  Constitutional:      General: She is not in acute distress.    Appearance: She is ill-appearing.     Comments: somnolent  HENT:     Head: Normocephalic and atraumatic.  Cardiovascular:     Rate and Rhythm: Normal rate and regular rhythm.     Pulses: Normal pulses.     Heart sounds: Normal heart sounds.  Pulmonary:     Effort: Pulmonary effort is normal.     Breath sounds: Normal breath sounds.  Abdominal:     General: Bowel sounds are normal.     Palpations: Abdomen is soft.  Neurological:     Mental Status: She is alert. Mental status is at baseline.     Data Reviewed: I have personally reviewed following labs and imaging studies  CBC: Recent Labs  Lab 08/16/24 1359 08/17/24 0332 08/18/24 0442 08/19/24 0347  WBC 17.9* 29.8* 27.2* 16.8*  NEUTROABS 16.8*  --   --   --   HGB 8.9* 8.5* 9.6* 9.1*  HCT 27.9* 25.9* 29.4* 28.0*  MCV 83.0 81.2 81.7 81.4  PLT 279 274 307 271   Basic Metabolic Panel: Recent Labs  Lab 08/16/24 1359 08/17/24 0332 08/18/24 0442 08/19/24 0347  NA 130* 128* 135 137  K 4.0 3.8 3.8 3.7  CL 96* 95* 102 104  CO2 17* 18* 21* 23  GLUCOSE 111* 91 88 90  BUN 18 24* 23 17  CREATININE 1.78* 1.74* 1.37* 1.16*  CALCIUM 8.5* 8.1* 8.9 8.5*   GFR: Estimated Creatinine Clearance: 29.8 mL/min (A) (by C-G formula based on SCr of 1.16 mg/dL (H)). Liver Function Tests: Recent Labs  Lab 08/16/24 1359 08/17/24 0332  AST 66* 73*  ALT <5 <5  ALKPHOS 359* 290*  BILITOT 0.5 0.3  PROT 6.3* 6.1*  ALBUMIN 3.2* 3.0*   No results for input(s): LIPASE, AMYLASE in the last 168 hours. No results for input(s): AMMONIA in the last 168 hours. Coagulation Profile: Recent Labs  Lab 08/16/24 1359  INR 1.2   Cardiac Enzymes: No results for input(s): CKTOTAL, CKMB, CKMBINDEX, TROPONINI in the last 168 hours. ProBNP, BNP (last 5 results) Recent Labs    08/16/24 1359  PROBNP  >35,000.0*   HbA1C: No results for input(s): HGBA1C in the last 72 hours. CBG: No results for input(s): GLUCAP in the last 168 hours. Lipid Profile: No results for input(s): CHOL, HDL, LDLCALC, TRIG, CHOLHDL, LDLDIRECT in the last 72 hours. Thyroid Function Tests: No results for input(s): TSH, T4TOTAL, FREET4, T3FREE, THYROIDAB in the last 72 hours. Anemia Panel: No results for input(s): VITAMINB12, FOLATE, FERRITIN, TIBC, IRON, RETICCTPCT in the last 72 hours. Sepsis Labs: Recent Labs  Lab 08/16/24 1432 08/16/24 1559 08/16/24 1600  PROCALCITON  --  100.00  --   LATICACIDVEN 4.6*  --  2.7*    Recent Results (from the past 240 hours)  Resp panel by RT-PCR (RSV, Flu A&B, Covid) Anterior Nasal Swab     Status: None   Collection Time: 08/16/24  1:59 PM   Specimen: Anterior Nasal Swab  Result Value Ref Range Status   SARS Coronavirus 2 by RT  PCR NEGATIVE NEGATIVE Final    Comment: (NOTE) SARS-CoV-2 target nucleic acids are NOT DETECTED.  The SARS-CoV-2 RNA is generally detectable in upper respiratory specimens during the acute phase of infection. The lowest concentration of SARS-CoV-2 viral copies this assay can detect is 138 copies/mL. A negative result does not preclude SARS-Cov-2 infection and should not be used as the sole basis for treatment or other patient management decisions. A negative result may occur with  improper specimen collection/handling, submission of specimen other than nasopharyngeal swab, presence of viral mutation(s) within the areas targeted by this assay, and inadequate number of viral copies(<138 copies/mL). A negative result must be combined with clinical observations, patient history, and epidemiological information. The expected result is Negative.  Fact Sheet for Patients:  bloggercourse.com  Fact Sheet for Healthcare Providers:  seriousbroker.it  This test  is no t yet approved or cleared by the [place]  FDA and  has been authorized for detection and/or diagnosis of SARS-CoV-2 by FDA under an Emergency Use Authorization (EUA). This EUA will remain  in effect (meaning this test can be used) for the duration of the COVID-19 declaration under Section 564(b)(1) of the Act, 21 U.S.C.section 360bbb-3(b)(1), unless the authorization is terminated  or revoked sooner.       Influenza A by PCR NEGATIVE NEGATIVE Final   Influenza B by PCR NEGATIVE NEGATIVE Final    Comment: (NOTE) The Xpert Xpress SARS-CoV-2/FLU/RSV plus assay is intended as an aid in the diagnosis of influenza from Nasopharyngeal swab specimens and should not be used as a sole basis for treatment. Nasal washings and aspirates are unacceptable for Xpert Xpress SARS-CoV-2/FLU/RSV testing.  Fact Sheet for Patients: bloggercourse.com  Fact Sheet for Healthcare Providers: seriousbroker.it  This test is not yet approved or cleared by the [place]  FDA and has been authorized for detection and/or diagnosis of SARS-CoV-2 by FDA under an Emergency Use Authorization (EUA). This EUA will remain in effect (meaning this test can be used) for the duration of the COVID-19 declaration under Section 564(b)(1) of the Act, 21 U.S.C. section 360bbb-3(b)(1), unless the authorization is terminated or revoked.     Resp Syncytial Virus by PCR NEGATIVE NEGATIVE Final    Comment: (NOTE) Fact Sheet for Patients: bloggercourse.com  Fact Sheet for Healthcare Providers: seriousbroker.it  This test is not yet approved or cleared by the [place]  FDA and has been authorized for detection and/or diagnosis of SARS-CoV-2 by FDA under an Emergency Use Authorization (EUA). This EUA will remain in effect (meaning this test can be used) for the duration of the COVID-19 declaration under Section  564(b)(1) of the Act, 21 U.S.C. section 360bbb-3(b)(1), unless the authorization is terminated or revoked.  Performed at [facility], [address], [place], [place] [address]   Blood Culture (routine x 2)     Status: Abnormal   Collection Time: 08/16/24  2:32 PM   Specimen: BLOOD  Result Value Ref Range Status   Specimen Description   Final    BLOOD BLOOD RIGHT ARM Performed at [facility], [address] [place], [place] [address]    Special Requests   Final    BOTTLES DRAWN AEROBIC AND ANAEROBIC Blood Culture adequate volume Performed at [facility], [address] [place], [place] [address]    Culture  Setup Time   Final    GRAM NEGATIVE RODS IN BOTH AEROBIC AND ANAEROBIC BOTTLES CRITICAL RESULT CALLED TO, READ BACK BY AND VERIFIED WITH:  NATHAN BELUE AT 0035  08/17/24 JG Performed at [facility], [address] [place], [place] [address]    Culture ESCHERICHIA COLI (A)  Final   Report Status 08/19/2024 FINAL  Final   Organism ID, Bacteria ESCHERICHIA COLI  Final      Susceptibility   Escherichia coli - MIC*    AMPICILLIN <=2 SENSITIVE Sensitive     CEFAZOLIN (NON-URINE) <=1 SENSITIVE Sensitive     CEFEPIME  <=0.12 SENSITIVE Sensitive     ERTAPENEM <=0.12 SENSITIVE Sensitive     CEFTRIAXONE  <=0.25 SENSITIVE Sensitive     CIPROFLOXACIN <=0.06 SENSITIVE Sensitive     GENTAMICIN <=1 SENSITIVE Sensitive     MEROPENEM <=0.25 SENSITIVE Sensitive     TRIMETH /SULFA  <=20 SENSITIVE Sensitive     AMPICILLIN/SULBACTAM <=2 SENSITIVE Sensitive     PIP/TAZO Value in next row Sensitive      <=4 SENSITIVEThis is a modified FDA-approved test that has been validated and its performance characteristics determined by the reporting laboratory.  This laboratory is certified under the Clinical Laboratory Improvement Amendments CLIA as qualified to perform high complexity clinical laboratory testing.    * ESCHERICHIA COLI  Blood Culture (routine x 2)      Status: Abnormal   Collection Time: 08/16/24  2:32 PM   Specimen: BLOOD  Result Value Ref Range Status   Specimen Description   Final    BLOOD BLOOD LEFT ARM Performed at [facility], [address] [place], [place] [address]    Special Requests   Final    BOTTLES DRAWN AEROBIC AND ANAEROBIC Blood Culture adequate volume Performed at [facility], [address] [place], [place] [address]    Culture  Setup Time   Final    GRAM NEGATIVE RODS IN BOTH AEROBIC AND ANAEROBIC BOTTLES CRITICAL VALUE NOTED.  VALUE IS CONSISTENT WITH PREVIOUSLY REPORTED AND CALLED VALUE. Performed at [facility], [address] [place], [place] [address]    Culture (A)  Final    ESCHERICHIA COLI SUSCEPTIBILITIES PERFORMED ON PREVIOUS CULTURE WITHIN THE LAST 5 DAYS. Performed at [facility], [address], [place], [place] [address]    Report Status 08/19/2024 FINAL  Final  Urine Culture     Status: Abnormal   Collection Time: 08/16/24  2:32 PM   Specimen: Urine, Catheterized  Result Value Ref Range Status   Specimen Description   Final    URINE, CATHETERIZED Performed at [facility], [address] [place], [place] [address]    Special Requests   Final    NONE Performed at [facility], [address] [place], [place] [address]    Culture >=100,000 COLONIES/mL ESCHERICHIA COLI (A)  Final   Report Status 08/18/2024 FINAL  Final   Organism ID, Bacteria ESCHERICHIA COLI (A)  Final      Susceptibility   Escherichia coli - MIC*    AMPICILLIN <=2 SENSITIVE Sensitive     CEFAZOLIN (URINE) Value in next row Sensitive      <=1 SENSITIVEThis is a modified FDA-approved test that has been validated and its performance characteristics determined by the reporting laboratory.  This laboratory is certified under the Clinical Laboratory Improvement Amendments CLIA as qualified to perform high complexity clinical laboratory testing.    CEFEPIME  Value  in next row Sensitive      <=1 SENSITIVEThis is a modified FDA-approved test that has been validated and its performance characteristics determined by the reporting laboratory.  This laboratory is certified under the Clinical Laboratory  Improvement Amendments CLIA as qualified to perform high complexity clinical laboratory testing.    ERTAPENEM Value in next row Sensitive      <=1 SENSITIVEThis is a modified FDA-approved test that has been validated and its performance characteristics determined by the reporting laboratory.  This laboratory is certified under the Clinical Laboratory Improvement Amendments CLIA as qualified to perform high complexity clinical laboratory testing.    CEFTRIAXONE  Value in next row Sensitive      <=1 SENSITIVEThis is a modified FDA-approved test that has been validated and its performance characteristics determined by the reporting laboratory.  This laboratory is certified under the Clinical Laboratory Improvement Amendments CLIA as qualified to perform high complexity clinical laboratory testing.    CIPROFLOXACIN Value in next row Sensitive      <=1 SENSITIVEThis is a modified FDA-approved test that has been validated and its performance characteristics determined by the reporting laboratory.  This laboratory is certified under the Clinical Laboratory Improvement Amendments CLIA as qualified to perform high complexity clinical laboratory testing.    GENTAMICIN Value in next row Sensitive      <=1 SENSITIVEThis is a modified FDA-approved test that has been validated and its performance characteristics determined by the reporting laboratory.  This laboratory is certified under the Clinical Laboratory Improvement Amendments CLIA as qualified to perform high complexity clinical laboratory testing.    NITROFURANTOIN Value in next row Sensitive      <=1 SENSITIVEThis is a modified FDA-approved test that has been validated and its performance characteristics determined by the  reporting laboratory.  This laboratory is certified under the Clinical Laboratory Improvement Amendments CLIA as qualified to perform high complexity clinical laboratory testing.    TRIMETH /SULFA  Value in next row Sensitive      <=1 SENSITIVEThis is a modified FDA-approved test that has been validated and its performance characteristics determined by the reporting laboratory.  This laboratory is certified under the Clinical Laboratory Improvement Amendments CLIA as qualified to perform high complexity clinical laboratory testing.    AMPICILLIN/SULBACTAM Value in next row Sensitive      <=1 SENSITIVEThis is a modified FDA-approved test that has been validated and its performance characteristics determined by the reporting laboratory.  This laboratory is certified under the Clinical Laboratory Improvement Amendments CLIA as qualified to perform high complexity clinical laboratory testing.    PIP/TAZO Value in next row Sensitive      <=4 SENSITIVEThis is a modified FDA-approved test that has been validated and its performance characteristics determined by the reporting laboratory.  This laboratory is certified under the Clinical Laboratory Improvement Amendments CLIA as qualified to perform high complexity clinical laboratory testing.    MEROPENEM Value in next row Sensitive      <=4 SENSITIVEThis is a modified FDA-approved test that has been validated and its performance characteristics determined by the reporting laboratory.  This laboratory is certified under the Clinical Laboratory Improvement Amendments CLIA as qualified to perform high complexity clinical laboratory testing.    * >=100,000 COLONIES/mL ESCHERICHIA COLI  Blood Culture ID Panel (Reflexed)     Status: Abnormal   Collection Time: 08/16/24  2:32 PM  Result Value Ref Range Status   Enterococcus faecalis NOT DETECTED NOT DETECTED Final   Enterococcus Faecium NOT DETECTED NOT DETECTED Final   Listeria monocytogenes NOT DETECTED NOT DETECTED  Final   Staphylococcus species NOT DETECTED NOT DETECTED Final   Staphylococcus aureus (BCID) NOT DETECTED NOT DETECTED Final   Staphylococcus epidermidis NOT DETECTED NOT DETECTED Final  Staphylococcus lugdunensis NOT DETECTED NOT DETECTED Final   Streptococcus species NOT DETECTED NOT DETECTED Final   Streptococcus agalactiae NOT DETECTED NOT DETECTED Final   Streptococcus pneumoniae NOT DETECTED NOT DETECTED Final   Streptococcus pyogenes NOT DETECTED NOT DETECTED Final   A.calcoaceticus-baumannii NOT DETECTED NOT DETECTED Final   Bacteroides fragilis NOT DETECTED NOT DETECTED Final   Enterobacterales DETECTED (A) NOT DETECTED Final    Comment: Enterobacterales represent a large order of gram negative bacteria, not a single organism. CRITICAL RESULT CALLED TO, READ BACK BY AND VERIFIED WITH:  NATHAN BELUE AT 0035 08/17/24 JG    Enterobacter cloacae complex NOT DETECTED NOT DETECTED Final   Escherichia coli DETECTED (A) NOT DETECTED Final    Comment: CRITICAL RESULT CALLED TO, READ BACK BY AND VERIFIED WITH:  NATHAN BELUE AT 0035 08/17/24 JG    Klebsiella aerogenes NOT DETECTED NOT DETECTED Final   Klebsiella oxytoca NOT DETECTED NOT DETECTED Final   Klebsiella pneumoniae NOT DETECTED NOT DETECTED Final   Proteus species NOT DETECTED NOT DETECTED Final   Salmonella species NOT DETECTED NOT DETECTED Final   Serratia marcescens NOT DETECTED NOT DETECTED Final   Haemophilus influenzae NOT DETECTED NOT DETECTED Final   Neisseria meningitidis NOT DETECTED NOT DETECTED Final   Pseudomonas aeruginosa NOT DETECTED NOT DETECTED Final   Stenotrophomonas maltophilia NOT DETECTED NOT DETECTED Final   Candida albicans NOT DETECTED NOT DETECTED Final   Candida auris NOT DETECTED NOT DETECTED Final   Candida glabrata NOT DETECTED NOT DETECTED Final   Candida krusei NOT DETECTED NOT DETECTED Final   Candida parapsilosis NOT DETECTED NOT DETECTED Final   Candida tropicalis NOT DETECTED NOT  DETECTED Final   Cryptococcus neoformans/gattii NOT DETECTED NOT DETECTED Final   CTX-M ESBL NOT DETECTED NOT DETECTED Final   Carbapenem resistance IMP NOT DETECTED NOT DETECTED Final   Carbapenem resistance KPC NOT DETECTED NOT DETECTED Final   Carbapenem resistance NDM NOT DETECTED NOT DETECTED Final   Carbapenem resist OXA 48 LIKE NOT DETECTED NOT DETECTED Final   Carbapenem resistance VIM NOT DETECTED NOT DETECTED Final    Comment: Performed at [facility], [address] [place], [place] [address]     Radiology Studies: DG OR UROLOGY CYSTO IMAGE [facility] ONLY) Result Date: 08/19/2024 There is no interpretation for this exam.  This order is for images obtained during a surgical procedure.  Please See Surgeries Tab for more information regarding the procedure.    Scheduled Meds:  aspirin  EC  81 mg Oral Daily   cholecalciferol   1,000 Units Oral Daily   enoxaparin  (LOVENOX ) injection  30 mg Subcutaneous Q24H   escitalopram   10 mg Oral Daily   latanoprost   1 drop Right Eye QHS   levothyroxine   100 mcg Oral Q0600   multivitamin with minerals  1 tablet Oral Daily   simvastatin   20 mg Oral Daily   timolol   1 drop Both Eyes Daily   Continuous Infusions:  cefTRIAXone  (ROCEPHIN )  IV 2 g (08/18/24 2219)     LOS: 3 days   Norval Bar, MD  [facility]  08/19/2024, 2:40 PM   "

## 2024-08-19 NOTE — Transfer of Care (Signed)
 Immediate Anesthesia Transfer of Care Note  Patient: Dawn Dickerson  Procedure(s) Performed: CYSTOSCOPY, WITH RETROGRADE PYELOGRAM AND URETERAL STENT INSERTION (Right: Ureter)  Patient Location: PACU  Anesthesia Type:General  Level of Consciousness: drowsy  Airway & Oxygen Therapy: Patient Spontanous Breathing and Patient connected to nasal cannula oxygen  Post-op Assessment: Report given to RN and Post -op Vital signs reviewed and stable  Post vital signs: stable  Last Vitals:  Vitals Value Taken Time  BP 132/57 08/19/24 11:45  Temp    Pulse 83 08/19/24 11:54  Resp 17 08/19/24 11:54  SpO2 97 % 08/19/24 11:54  Vitals shown include unfiled device data.  Last Pain:  Vitals:   08/19/24 0911  TempSrc: Oral  PainSc: 0-No pain         Complications: There were no known notable events for this encounter.

## 2024-08-19 NOTE — Progress Notes (Signed)
 No resp distress noted upon transfer back to 244

## 2024-08-19 NOTE — Anesthesia Procedure Notes (Signed)
 Date/Time: 08/19/2024 10:34 AM  Performed by: Governor Selinda NOVAK, CRNAPre-anesthesia Checklist: Patient identified, Emergency Drugs available, Suction available and Patient being monitored Patient Re-evaluated:Patient Re-evaluated prior to induction Oxygen Delivery Method: Circle system utilized Preoxygenation: Pre-oxygenation with 100% oxygen Induction Type: IV induction Ventilation: Mask ventilation without difficulty LMA: LMA inserted LMA Size: 4.0

## 2024-08-19 NOTE — Op Note (Signed)
" ° ° °  Preoperative diagnosis:  Right hydronephrosis Pyelonephritis/sepsis  Postoperative diagnosis:  Right pyonephrosis Right UPJ obstruction  Procedure:  Cystoscopy Right ureteral stent placement (4.8 F/28 cm Percuflex Plus) Right retrograde pyelography with interpretation  Surgeon: Glendia C. Efrain Clauson, M.D.  Anesthesia: MAC  Complications: None  Intraoperative findings:  Cystoscopy: Bladder mucosa without solid or papillary lesions.  UOs normal-appearing bilaterally cystitis cystica right hemitrigone Right retrograde pyelogram: Right hydronephrosis with narrowing at UPJ/proximal ureter  EBL: Minimal  Specimens: None  Indication: Dawn Dickerson is a 89 y.o. female admitted with pyelonephritis/sepsis with E. coli bacteremia.  Subsequently found to have severe right hydronephrosis. After reviewing the management options for treatment, she elected to proceed with the above surgical procedure(s). We have discussed the potential benefits and risks of the procedure, side effects of the proposed treatment, the likelihood of the patient achieving the goals of the procedure, and any potential problems that might occur during the procedure or recuperation. Informed consent has been obtained.  Description of procedure:  The patient was taken to the operating room and general anesthesia was induced.  The patient was placed in the dorsal lithotomy position, prepped and draped in the usual sterile fashion, and preoperative antibiotics were administered. A preoperative time-out was performed.   A 21 French cystoscope sheath with obturator was lubricated and passed per urethra.  A 30 degree lens was then placed and panendoscopy was performed with findings as described above.  Attention was directed to the right ureteral orifice and a 0.038 Sensor guidewire was then advanced up the ureter however resistance was met in the region of the renal pelvis.  A 51F open-ended ureteral catheter was placed  into the right ureter over the Sensor wire and positioned in the proximal ureter.  The sensor wire was removed and a 0.038 ZIPwire was placed to the ureteral catheter and advanced into the renal pelvis without difficulty.  The ureteral catheter was advanced over the wire.  Purulent urine was aspirated and retrograde pyelogram was then performed with findings as described above.  The sensor wire was then placed through the ureteral catheter followed by ureteral catheter removal.  Brisk efflux of purulent urine was noted after placement of the Sensor wire.  Attempts at placing a 61F/24 cm Bard Optima ureteral stent were not successful as the proximal portion of the stent could not be advanced into the renal pelvis.  A 4.8 F/28 cm Percuflex Plus ureteral stent was advanced over the guidewire into the renal pelvis with good curl noted.  The bladder was then emptied and the procedure ended.  The patient appeared to tolerate the procedure well and without complications.  The patient was transported to the PACU in stable condition.  Plan: Will plan upsizing ureteral stent in ~3 months   Glendia Barba, MD  "

## 2024-08-19 NOTE — Progress Notes (Signed)
 PT Cancellation Note  Patient Details Name: Dawn Dickerson MRN: [id number] DOB: 1932-04-26   Cancelled Treatment:    Reason Eval/Treat Not Completed: Patient at procedure or test/unavailable;Patient declined, no reason specified PT out of room for procedure most of the morning.  On return to room cleared by RN to work with PT but on arrival to room pt sleepy and kindly refuses PT stating she was tired and just couldn't do any activity today.  Will maintain on PT caseload and attempt to see when pt is more able and willing to participate.  Carmin JONELLE Deed, DPT 08/19/2024, 2:36 PM

## 2024-08-19 NOTE — Hospital Course (Signed)
 89 y.o. 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  Reportedly, when EMS arrived, oxygen saturation was 78% on room air.  She was placed on 15 L oxygen via nonrebreather mask and oxygen came up to 97%.  She was subsequently transported to the ED for further management.   Vital signs in the ED: Temperature 99.6 F (went up to 101 F), respiratory rate 26-30, heart rate 113, oxygen saturation 100% on 15 L via nonrebreather mask.  BP dropped to 96/48. Labs significant for sodium 130, bicarb 17, creatinine 1.78, GFR 27, troponin 464, proBNP greater than 35,000, lactic acid 4.6, WBC 17.9, hemoglobin 8.9.   Admitted for severe sepsis in the setting of acute complicated UTI and presumed bacterial PNA. Hospital course complicated by severe R hydronephrosis and AKI on CKD.

## 2024-08-19 NOTE — Progress Notes (Addendum)
 Occupational Therapy Treatment Patient Details Name: Dawn Dickerson MRN: [id number] DOB: 09-May-1932 Today's Date: 08/19/2024   History of present illness Pt is a pleasant 89 y.o. female with medical history significant for HTN, HLD, macular degeneration, and pulmonary HTN who came in to [facility] ED from home for progressive worsening shortness of breath. MD assessment includes: severe sepsis secondary to acute UTI and E. coli bacteremia, acute on chronic diastolic CHF, severe right hydronephrosis, AKI, elevated troponins likely due to demand ischemia, and hyponatremia. She is now s/p R ureteral stent placement 1/22.    OT comments  Re-eval now that pt is s/p R ureteral stent placement 1/22. Pt is supine in bed on arrival. Easily arousable as pt was asleep on entry, but agreeable to OT session. She denies pain. Pt performed bed mobility with supervision and STS from EOB with SBA. ADL transfer ~20 ft to bathroom and back using RW + CGA. Required CGA/SBA for toilet transfer and toileting hygiene as well as standing hand hygiene. Declined further mobility d/t fatigue and wanting to rest following procedure this AM. Pt was placed on RA for activity and noted with desat to 88%, but quick improvement to 90-92% with rest. Replaced 1L 02 via  upon return to bed and nurse notified. HR up to 112 with activity during session.  Pt returned to bed with all needs in place and will cont to require skilled acute OT services to maximize her safety and IND to return to PLOF.       If plan is discharge home, recommend the following:  A little help with walking and/or transfers;A little help with bathing/dressing/bathroom;Assistance with cooking/housework;Help with stairs or ramp for entrance   Equipment Recommendations  Tub/shower bench    Recommendations for Other Services      Precautions / Restrictions Precautions Precautions: Fall Recall of Precautions/Restrictions: Intact Restrictions Weight Bearing  Restrictions Per Provider Order: No       Mobility Bed Mobility Overal bed mobility: Needs Assistance Bed Mobility: Supine to Sit, Sit to Supine     Supine to sit: Supervision, HOB elevated, Used rails Sit to supine: Supervision        Transfers Overall transfer level: Needs assistance Equipment used: Rolling walker (2 wheels) Transfers: Sit to/from Stand Sit to Stand: Contact guard assist, Supervision           General transfer comment: stands from EOB to RW and ambulated to bathroom and back on RA with desat to 88% and improvement to 91-93% at rest; placed back on 1 L 02 at end of session with nurse aware     Balance Overall balance assessment: Needs assistance Sitting-balance support: Feet supported Sitting balance-Leahy Scale: Normal     Standing balance support: Bilateral upper extremity supported, During functional activity Standing balance-Leahy Scale: Good Standing balance comment: RW                           ADL either performed or assessed with clinical judgement   ADL Overall ADL's : Needs assistance/impaired     Grooming: Wash/dry hands;Standing;Contact guard assist                   Toilet Transfer: Contact guard assist;Rolling walker (2 wheels);Regular Toilet;Grab bars Toilet Transfer Details (indicate cue type and reason): increased time/effort on regular height commode Toileting- Clothing Manipulation and Hygiene: Supervision/safety;Sit to/from stand Toileting - Clothing Manipulation Details (indicate cue type and reason): after cont BM and void  on toilet     Functional mobility during ADLs: Contact guard assist;Rolling walker (2 wheels) General ADL Comments: ADL transfer ~20 ft using RW + CGA    Extremity/Trunk Assessment              Vision       Perception     Praxis     Communication Communication Communication: No apparent difficulties   Cognition Arousal: Alert Behavior During Therapy: WFL for tasks  assessed/performed                                 Following commands: Intact        Cueing   Cueing Techniques: Verbal cues  Exercises      Shoulder Instructions       General Comments HR up to 112 and sp02 left on 1L at 93%    Pertinent Vitals/ Pain       Pain Assessment Pain Assessment: No/denies pain  Home Living                                          Prior Functioning/Environment              Frequency  Min 2X/week        Progress Toward Goals  OT Goals(current goals can now be found in the care plan section)  Progress towards OT goals: Progressing toward goals  Acute Rehab OT Goals Patient Stated Goal: improve endurance OT Goal Formulation: With patient Time For Goal Achievement: 08/31/24 Potential to Achieve Goals: Good  Plan      Co-evaluation                 AM-PAC OT 6 Clicks Daily Activity     Outcome Measure   Help from another person eating meals?: None Help from another person taking care of personal grooming?: None Help from another person toileting, which includes using toliet, bedpan, or urinal?: A Little Help from another person bathing (including washing, rinsing, drying)?: A Little Help from another person to put on and taking off regular upper body clothing?: None Help from another person to put on and taking off regular lower body clothing?: A Little 6 Click Score: 21    End of Session Equipment Utilized During Treatment: Rolling walker (2 wheels);Oxygen  OT Visit Diagnosis: Other abnormalities of gait and mobility (R26.89);Muscle weakness (generalized) (M62.81)   Activity Tolerance Patient tolerated treatment well   Patient Left with call bell/phone within reach;in bed;with bed alarm set   Nurse Communication Mobility status        Time: 8443-8377 OT Time Calculation (min): 26 min  Charges: OT General Charges $OT Visit: 1 Visit OT Treatments $Self Care/Home Management  : 23-37 mins  Summerlyn Fickel Chrismon, OTR/L  08/19/24, 5:19 PM   Valeree Leidy E Chrismon 08/19/2024, 5:17 PM

## 2024-08-19 NOTE — Addendum Note (Signed)
 Addendum  created 08/19/24 1320 by Vicci Camellia Glatter, MD   Clinical Note Signed

## 2024-08-19 NOTE — Anesthesia Postprocedure Evaluation (Addendum)
"   Anesthesia Post Note  Patient: Dawn Dickerson  Procedure(s) Performed: CYSTOSCOPY, WITH RETROGRADE PYELOGRAM AND URETERAL STENT INSERTION (Right: Ureter)  Patient location during evaluation: PACU Anesthesia Type: General Level of consciousness: awake and alert Pain management: pain level controlled Vital Signs Assessment: post-procedure vital signs reviewed and stable Respiratory status: spontaneous breathing, nonlabored ventilation, respiratory function stable and patient connected to nasal cannula oxygen Cardiovascular status: blood pressure returned to baseline and stable Postop Assessment: no apparent nausea or vomiting Anesthetic complications: no   No notable events documented.   Last Vitals:  Vitals:   08/19/24 1215 08/19/24 1228  BP: (!) 160/72   Pulse: 86 95  Resp: (!) 24 (!) 27  Temp:  37.1 C  SpO2: 96% 94%    Last Pain:  Vitals:   08/19/24 1224  TempSrc:   PainSc: 3                  Camellia Merilee Louder      "

## 2024-08-19 NOTE — Anesthesia Preprocedure Evaluation (Addendum)
 "                                  Anesthesia Evaluation  Patient identified by MRN, date of birth, ID band Patient awake    Reviewed: Allergy & Precautions, H&P , NPO status , Patient's Chart, lab work & pertinent test results  Airway Mallampati: II  TM Distance: >3 FB Neck ROM: full    Dental  (+) Upper Dentures, Edentulous Lower   Pulmonary neg pulmonary ROS   Pulmonary exam normal        Cardiovascular hypertension, +CHF (Chronic diastolic CHF)  Normal cardiovascular exam  ECHO 1/26:  1. Left ventricular ejection fraction, by estimation, is 70 to 75%. The  left ventricle has hyperdynamic function. The left ventricle has no  regional wall motion abnormalities. There is mild concentric left  ventricular hypertrophy. Left ventricular  diastolic parameters are consistent with Grade I diastolic dysfunction  (impaired relaxation).   2. Right ventricular systolic function is normal. The right ventricular  size is normal.   3. Left atrial size was mildly dilated.   4. Right atrial size was mildly dilated.   5. The mitral valve is grossly normal. Mild mitral valve regurgitation.   6. The aortic valve is calcified. Aortic valve regurgitation is moderate.  Aortic valve sclerosis/calcification is present, without any evidence of  aortic stenosis.     Neuro/Psych  PSYCHIATRIC DISORDERS  Depression    negative neurological ROS     GI/Hepatic negative GI ROS, Neg liver ROS,,,  Endo/Other  Hypothyroidism    Renal/GU Renal InsufficiencyRenal disease     Musculoskeletal   Abdominal   Peds  Hematology  (+) Blood dyscrasia, anemia   Anesthesia Other Findings Past Medical History: No date: Actinic keratosis Admitted for severe sepsis in the setting of acute complicated UTI and presumed bacterial PNA. Hospital course complicated by severe R hydronephrosis and AKI on CKD.  Elevated troponins: 464-533.   ACS ruled out. This is likely due to demand ischemia. 2D  echo showed preserved EF.    02/05/2024: Basal cell carcinoma     Comment:  left mid calf, EDC No date: Cancer (HCC)     Comment:  skin ca, right forearm No date: Glaucoma No date: Hyperlipemia No date: Macular degeneration No date: Osteoporosis No date: Psoriasis No date: Pulmonary hypertension (HCC) 03/25/2023: SCC (squamous cell carcinoma)     Comment:  right anterior thigh, excision 04/09/23 No date: Thyroid disease  Past Surgical History: No date: BREAST BIOPSY; Bilateral     Comment:  neg No date: TONSILLECTOMY  BMI    Body Mass Index: 26.44 kg/m      Reproductive/Obstetrics negative OB ROS                              Anesthesia Physical Anesthesia Plan  ASA: 3  Anesthesia Plan: General LMA   Post-op Pain Management: Ofirmev  IV (intra-op)*   Induction: Intravenous  PONV Risk Score and Plan: Ondansetron , TIVA and Propofol  infusion  Airway Management Planned: Natural Airway  Additional Equipment:   Intra-op Plan:   Post-operative Plan:   Informed Consent: I have reviewed the patients History and Physical, chart, labs and discussed the procedure including the risks, benefits and alternatives for the proposed anesthesia with the patient or authorized representative who has indicated his/her understanding and acceptance.     Dental Advisory  Given  Plan Discussed with: Anesthesiologist, CRNA and Surgeon  Anesthesia Plan Comments:          Anesthesia Quick Evaluation  "

## 2024-08-19 NOTE — Progress Notes (Signed)
 Pt is complaining of cough but is NPO. NP foust made aware.   Update [phone]: See new order.

## 2024-08-19 NOTE — TOC Initial Note (Signed)
 Transition of Care [facility]) - Initial/Assessment Note    Patient Details  Name: Dawn Dickerson MRN: [id number] Date of Birth: [birth year]  [facility] CM/SW Contact:    Shasta DELENA Daring, RN Phone Number: 08/19/2024, 6:14 PM  Clinical Narrative:                 Met with patient. No family present.   Advised that PT and OT are recommending she have home health services when she is discharged to help improve her strength. Patient said she doesn't think she will need it but asked TOC to contact her son.  Called son and left VM. Will follow up.  Per PT, patient will likely dc to her son's house for the short term  Expected Discharge Plan: Home w Home Health Services Barriers to Discharge: Continued Medical Work up   Patient Goals and CMS Choice            Expected Discharge Plan and Services                                              Prior Living Arrangements/Services     Patient language and need for interpreter reviewed:: Yes        Need for Family Participation in Patient Care: Yes (Comment) Care giver support system in place?: Yes (comment)   Criminal Activity/Legal Involvement Pertinent to Current Situation/Hospitalization: No - Comment as needed  Activities of Daily Living      Permission Sought/Granted Permission sought to share information with : Family Supports Permission granted to share information with : Yes, Verbal Permission Granted              Emotional Assessment Appearance:: Appears stated age Attitude/Demeanor/Rapport: Gracious Affect (typically observed): Appropriate Orientation: : Oriented to Self, Oriented to Place, Oriented to  Time, Oriented to Situation Alcohol / Substance Use: Not Applicable Psych Involvement: No (comment)  Admission diagnosis:  Acute pulmonary edema (HCC) [J81.0] CAP (community acquired pneumonia) [J18.9] Acute respiratory failure with hypoxia (HCC) [J96.01] Septic shock (HCC) [A41.9,  R65.21] Patient Active Problem List   Diagnosis Date Noted   Severe sepsis (HCC) 08/17/2024   E coli bacteremia 08/17/2024   Hydronephrosis of right kidney 08/17/2024   Hyponatremia 08/17/2024   Depression 04/16/2023   Glaucoma 04/16/2023   Sepsis due to Klebsiella pneumoniae (HCC) 04/15/2023   UTI (urinary tract infection) 04/15/2023   Bronchitis 04/15/2023   Elevated troponin 04/15/2023   AKI (acute kidney injury) 04/15/2023   HTN (hypertension) 01/27/2022   Hyperlipidemia 01/27/2022   Hypothyroidism 01/27/2022   PCP:  Fernande Ophelia JINNY DOUGLAS, MD Pharmacy:   [facility] [address] [place] - [address] [place] [address] Phone: [phone] Fax: [phone]  [facility] [address], [place] - [address] [place] [address] Phone: [phone] Fax: [phone]  [facility] - [place], [place] - [address] [place] [address] Phone: [phone] Fax: [phone]  [facility] [address] [place] [address] Phone: [phone] Fax: [phone]     Social Drivers of Health (SDOH) Social History: SDOH Screenings   Food Insecurity: No Food Insecurity (08/16/2024)  Housing: Low Risk (08/16/2024)  Transportation Needs: No Transportation Needs (08/16/2024)  Utilities: Not At Risk (08/16/2024)  Financial Resource Strain: Low Risk  (08/12/2024)   Received from [facility]  Social Connections: Unknown (08/16/2024)  Tobacco Use: Low Risk (08/19/2024)   SDOH Interventions:     Readmission Risk Interventions     No data to display

## 2024-08-19 NOTE — Anesthesia Procedure Notes (Signed)
 Procedure Name: LMA Insertion Date/Time: 08/19/2024 10:34 AM  Performed by: Tod Handing, CRNAPre-anesthesia Checklist: Patient identified, Emergency Drugs available, Suction available and Patient being monitored Patient Re-evaluated:Patient Re-evaluated prior to induction Oxygen Delivery Method: Circle system utilized Preoxygenation: Pre-oxygenation with 100% oxygen Induction Type: IV induction Ventilation: Mask ventilation without difficulty LMA: LMA inserted LMA Size: 4.0 Number of attempts: 1 Dental Injury: Teeth and Oropharynx as per pre-operative assessment

## 2024-08-20 ENCOUNTER — Other Ambulatory Visit: Payer: Self-pay

## 2024-08-20 ENCOUNTER — Encounter: Payer: Self-pay | Admitting: Urology

## 2024-08-20 LAB — BASIC METABOLIC PANEL WITH GFR
Anion gap: 10 (ref 5–15)
BUN: 12 mg/dL (ref 8–23)
CO2: 23 mmol/L (ref 22–32)
Calcium: 8.4 mg/dL — ABNORMAL LOW (ref 8.9–10.3)
Chloride: 105 mmol/L (ref 98–111)
Creatinine, Ser: 1 mg/dL (ref 0.44–1.00)
GFR, Estimated: 53 mL/min — ABNORMAL LOW
Glucose, Bld: 99 mg/dL (ref 70–99)
Potassium: 3.5 mmol/L (ref 3.5–5.1)
Sodium: 138 mmol/L (ref 135–145)

## 2024-08-20 LAB — URINALYSIS, COMPLETE (UACMP) WITH MICROSCOPIC
Bilirubin Urine: NEGATIVE
Glucose, UA: NEGATIVE mg/dL
Ketones, ur: NEGATIVE mg/dL
Nitrite: NEGATIVE
Protein, ur: 100 mg/dL — AB
RBC / HPF: >50 RBC/hpf (ref 0–5)
Specific Gravity, Urine: 1.019 (ref 1.005–1.030)
Squamous Epithelial / HPF: 0 /HPF (ref 0–5)
WBC, UA: >50 WBC/hpf (ref 0–5)
pH: 5 (ref 5.0–8.0)

## 2024-08-20 MED ORDER — SULFAMETHOXAZOLE-TRIMETHOPRIM 800-160 MG PO TABS
1.0000 | ORAL_TABLET | Freq: Two times a day (BID) | ORAL | 0 refills | Status: AC
  Filled 2024-08-20: qty 12, 6d supply, fill #0

## 2024-08-20 NOTE — Discharge Instructions (Signed)
 Take antibiotics as prescribed to complete total 10 days course Follow up with urology as scheduled (urology team will contact you for appointment) Follow up with PCP within one week

## 2024-08-20 NOTE — Progress Notes (Signed)
 Physical Therapy Treatment Patient Details Name: Dawn Dickerson MRN: [id number] DOB: Oct 12, 1931 Today's Date: 08/20/2024   History of Present Illness Pt is a pleasant 89 y.o. female with medical history significant for HTN, HLD, macular degeneration, and pulmonary HTN who came in to [facility] ED from home for progressive worsening shortness of breath. MD assessment includes: severe sepsis secondary to acute UTI and E. coli bacteremia, acute on chronic diastolic CHF, severe right hydronephrosis, AKI, elevated troponins likely due to demand ischemia, and hyponatremia.    PT Comments  On arrival, patient attempting to get oob as she needs to go to the bathroom. Family had just entered room prior to my arrival. Patient is mod I with supine to sit. She stands with cga. Found to have soiled self and bed. Ambulated 6 feet to [facility] then 45 more feet in room with RW and supervision. Patient demonstrates good mobility with RW and supervision. Limited by fatigue and needing to be cleaned up on BSC. Patient will continue to benefit from skilled PT to improve activity, strength and endurance.     If plan is discharge home, recommend the following: A little help with walking and/or transfers;A little help with bathing/dressing/bathroom;Help with stairs or ramp for entrance;Assist for transportation   Can travel by private vehicle      yes  Equipment Recommendations  None recommended by PT    Recommendations for Other Services       Precautions / Restrictions Precautions Precautions: Fall Recall of Precautions/Restrictions: Intact Restrictions Weight Bearing Restrictions Per Provider Order: No     Mobility  Bed Mobility Overal bed mobility: Modified Independent Bed Mobility: Supine to Sit     Supine to sit: Modified independent (Device/Increase time)          Transfers Overall transfer level: Needs assistance Equipment used: Rolling walker (2 wheels) Transfers: Sit to/from Stand Sit to Stand:  Contact guard assist                Ambulation/Gait Ambulation/Gait assistance: Supervision Gait Distance (Feet): 50 Feet Assistive device: Rolling walker (2 wheels) Gait Pattern/deviations: Step-through pattern Gait velocity: WFL     General Gait Details: patient ambulated well in room x 2 reps to door and back after ambulating to [facility].   Stairs             Wheelchair Mobility     Tilt Bed    Modified Rankin (Stroke Patients Only)       Balance Overall balance assessment: Modified Independent Sitting-balance support: Feet supported Sitting balance-Leahy Scale: Normal     Standing balance support: Bilateral upper extremity supported, During functional activity Standing balance-Leahy Scale: Good                              Communication Communication Communication: No apparent difficulties  Cognition Arousal: Alert Behavior During Therapy: WFL for tasks assessed/performed   PT - Cognitive impairments: No apparent impairments                         Following commands: Intact      Cueing Cueing Techniques: Verbal cues  Exercises      General Comments        Pertinent Vitals/Pain Pain Assessment Pain Assessment: No/denies pain    Home Living  Prior Function            PT Goals (current goals can now be found in the care plan section) Acute Rehab PT Goals Patient Stated Goal: To get well and return home PT Goal Formulation: With patient/family Time For Goal Achievement: 08/30/24 Potential to Achieve Goals: Good Progress towards PT goals: Progressing toward goals    Frequency    Min 2X/week      PT Plan      Co-evaluation              AM-PAC PT 6 Clicks Mobility   Outcome Measure  Help needed turning from your back to your side while in a flat bed without using bedrails?: None Help needed moving from lying on your back to sitting on the side of a flat bed  without using bedrails?: None Help needed moving to and from a bed to a chair (including a wheelchair)?: A Little Help needed standing up from a chair using your arms (e.g., wheelchair or bedside chair)?: A Little Help needed to walk in hospital room?: A Little Help needed climbing 3-5 steps with a railing? : A Little 6 Click Score: 20    End of Session   Activity Tolerance: Patient tolerated treatment well Patient left: in chair;with call bell/phone within reach;with chair alarm set;with family/visitor present Nurse Communication: Mobility status PT Visit Diagnosis: Muscle weakness (generalized) (M62.81)     Time: 8961-8945 PT Time Calculation (min) (ACUTE ONLY): 16 min  Charges:    $Therapeutic Activity: 8-22 mins PT General Charges $$ ACUTE PT VISIT: 1 Visit                     Jerrion Tabbert, PT, GCS 08/20/24,11:00 AM

## 2024-08-20 NOTE — TOC Progression Note (Signed)
 Transition of Care [facility]) - Progression Note    Patient Details  Name: Dawn Dickerson MRN: [id number] Date of Birth: January 19, 1932  [facility] CM/SW Contact  Shasta DELENA Daring, RN Phone Number: 08/20/2024, 1:46 PM  Clinical Narrative:    Spoke with patient's son. He said patient does not want to have home health PT and they are refusing service. He said if they change their mine when she gets home, they will contact her PCP for a referral.  No additional TOC needs.      Expected Discharge Plan: Home w Home Health Services Barriers to Discharge: Continued Medical Work up               Expected Discharge Plan and Services                                               Social Drivers of Health (SDOH) Interventions SDOH Screenings   Food Insecurity: No Food Insecurity (08/16/2024)  Housing: Low Risk (08/16/2024)  Transportation Needs: No Transportation Needs (08/16/2024)  Utilities: Not At Risk (08/16/2024)  Financial Resource Strain: Low Risk  (08/12/2024)   Received from [facility]  Social Connections: Unknown (08/16/2024)  Tobacco Use: Low Risk (08/19/2024)    Readmission Risk Interventions     No data to display

## 2024-08-20 NOTE — Progress Notes (Signed)
 Urology Consult Follow Up  Subjective: Patient had an uneventful night.    VSS afebrile  Serum creatinine improved from 1.16 yesterday to 1.00 today.   She was unable to void last night and was straight cathed for residual 350 cc.  She has not voided yet this morning, but she does not have the urge to void at this time.  She states that they performed a bladder scan on her this morning and her bladder was empty.    She denies any previous issues with urination prior to this admission.   Anti-infectives: Anti-infectives (From admission, onward)    Start     Dose/Rate Route Frequency Ordered Stop   08/17/24 2200  cefTRIAXone  (ROCEPHIN ) 2 g in sodium chloride  0.9 % 100 mL IVPB        2 g 200 mL/hr over 30 Minutes Intravenous Every 24 hours 08/17/24 1322     08/16/24 2200  cefTRIAXone  (ROCEPHIN ) 2 g in sodium chloride  0.9 % 100 mL IVPB  Status:  Discontinued        2 g 200 mL/hr over 30 Minutes Intravenous Every 24 hours 08/16/24 1551 08/17/24 1322   08/16/24 1700  azithromycin  (ZITHROMAX ) tablet 500 mg  Status:  Discontinued        500 mg Oral Daily 08/16/24 1654 08/17/24 1426   08/16/24 1600  vancomycin  (VANCOREADY) IVPB 1250 mg/250 mL        1,250 mg 166.7 mL/hr over 90 Minutes Intravenous  Once 08/16/24 1517 08/16/24 1812   08/16/24 1600  azithromycin  (ZITHROMAX ) 500 mg in sodium chloride  0.9 % 250 mL IVPB  Status:  Discontinued        500 mg 250 mL/hr over 60 Minutes Intravenous Every 24 hours 08/16/24 1551 08/16/24 1654   08/16/24 1515  piperacillin -tazobactam (ZOSYN ) IVPB 3.375 g        3.375 g 100 mL/hr over 30 Minutes Intravenous  Once 08/16/24 1511 08/16/24 1606       Current Facility-Administered Medications  Medication Dose Route Frequency Provider Last Rate Last Admin   acetaminophen  (TYLENOL ) tablet 650 mg  650 mg Oral Q6H PRN Stoioff, Scott C, MD   650 mg at 08/16/24 1742   Or   acetaminophen  (TYLENOL ) suppository 650 mg  650 mg Rectal Q6H PRN Stoioff, Scott C, MD        aspirin  EC tablet 81 mg  81 mg Oral Daily Stoioff, Scott C, MD   81 mg at 08/19/24 1508   cefTRIAXone  (ROCEPHIN ) 2 g in sodium chloride  0.9 % 100 mL IVPB  2 g Intravenous Q24H Stoioff, Scott C, MD   Stopped at 08/19/24 2147   cholecalciferol  (VITAMIN D3) 25 MCG (1000 UNIT) tablet 1,000 Units  1,000 Units Oral Daily Stoioff, Scott C, MD   1,000 Units at 08/19/24 1509   enoxaparin  (LOVENOX ) injection 30 mg  30 mg Subcutaneous Q24H Stoioff, Scott C, MD   30 mg at 08/19/24 1507   escitalopram  (LEXAPRO ) tablet 10 mg  10 mg Oral Daily Stoioff, Scott C, MD   10 mg at 08/19/24 1509   fentaNYL  (SUBLIMAZE ) injection 12.5 mcg  12.5 mcg Intravenous Q2H PRN Stoioff, Scott C, MD       guaiFENesin -dextromethorphan  (ROBITUSSIN DM) 100-10 MG/5ML syrup 5 mL  5 mL Oral Q4H PRN Stoioff, Scott C, MD   5 mL at 08/19/24 0658   ipratropium-albuterol  (DUONEB) 0.5-2.5 (3) MG/3ML nebulizer solution 3 mL  3 mL Nebulization Q6H PRN Stoioff, Scott C, MD   3 mL at  08/18/24 0850   latanoprost  (XALATAN ) 0.005 % ophthalmic solution 1 drop  1 drop Right Eye QHS Stoioff, Scott C, MD   1 drop at 08/19/24 2112   levothyroxine  (SYNTHROID ) tablet 100 mcg  100 mcg Oral Q0600 Stoioff, Scott C, MD   100 mcg at 08/20/24 0520   multivitamin with minerals tablet 1 tablet  1 tablet Oral Daily Stoioff, Scott C, MD   1 tablet at 08/19/24 1509   ondansetron  (ZOFRAN ) tablet 4 mg  4 mg Oral Q6H PRN Stoioff, Glendia BROCKS, MD       Or   ondansetron  (ZOFRAN ) injection 4 mg  4 mg Intravenous Q6H PRN Stoioff, Scott C, MD       oxyCODONE  (Oxy IR/ROXICODONE ) immediate release tablet 5 mg  5 mg Oral Q4H PRN Stoioff, Scott C, MD       polyethylene glycol (MIRALAX  / GLYCOLAX ) packet 17 g  17 g Oral Daily PRN Stoioff, Scott C, MD       simvastatin  (ZOCOR ) tablet 20 mg  20 mg Oral Daily Stoioff, Scott C, MD   20 mg at 08/19/24 1509   timolol  (TIMOPTIC ) 0.5 % ophthalmic solution 1 drop  1 drop Both Eyes Daily Stoioff, Scott C, MD   1 drop at 08/18/24 1420      Objective: Vital signs in last 24 hours: Temp:  [97.4 F (36.3 C)-99 F (37.2 C)] 98.9 F (37.2 C) (01/23 0320) Pulse Rate:  [79-103] 103 (01/23 0320) Resp:  [18-27] 18 (01/23 0320) BP: (123-164)/(57-72) 135/59 (01/23 0320) SpO2:  [89 %-100 %] 93 % (01/23 0320) Weight:  [67.7 kg-68.2 kg] 68.2 kg (01/23 0500)  Intake/Output from previous day: 01/22 0701 - 01/23 0700 In: 1040.2 [P.O.:340; I.V.:500; IV Piggyback:200.2] Out: 362 [Urine:360; Blood:2] Intake/Output this shift: No intake/output data recorded.   Physical Exam Constitutional:  Well nourished. Alert and oriented, No acute distress. HEENT: [place] AT, moist mucus membranes.  Trachea midline Cardiovascular: No clubbing, cyanosis, or edema. Respiratory: Normal respiratory effort, no increased work of breathing. Neurologic: Grossly intact, no focal deficits, moving all 4 extremities. Psychiatric: Normal mood and affect.    Lab Results:  Recent Labs    08/18/24 0442 08/19/24 0347  WBC 27.2* 16.8*  HGB 9.6* 9.1*  HCT 29.4* 28.0*  PLT 307 271   BMET Recent Labs    08/19/24 0347 08/20/24 0341  NA 137 138  K 3.7 3.5  CL 104 105  CO2 23 23  GLUCOSE 90 99  BUN 17 12  CREATININE 1.16* 1.00  CALCIUM 8.5* 8.4*   PT/INR No results for input(s): LABPROT, INR in the last 72 hours. ABG No results for input(s): PHART, HCO3 in the last 72 hours.  Invalid input(s): PCO2, PO2  Studies/Results: DG OR UROLOGY CYSTO IMAGE ([facility] ONLY) Result Date: 08/19/2024 There is no interpretation for this exam.  This order is for images obtained during a surgical procedure.  Please See Surgeries Tab for more information regarding the procedure.     Assessment: 89 year old woman admitted with sepsis secondary to UTI/pyelonephritis who underwent a right ureteral stent placement to address the severe right hydronephrosis secondary to UPJ obstruction on January 22 with Dr. Twylla.  Interoperative findings noted right  hydronephrosis with a narrowing at the UPJ/proximal ureter with a brisk efflux of purulent urine.    -serum creatinine has normalized -was unable to void last night and had to be straight cathed with the return of purulent urine which is the likely the debris from the upper  tract  Plan: -Continue empiric IV antibiotics; will need to be on 10 days of culture appropriate antibiotics -Maintain hydration and encourage timed voiding, assisting the patient with attempts to void -If postvoid residuals remain greater than 300 to 400 mL on 2 consecutive scans or patient is unable to void with bladder volume greater than 500 mL, please place Foley catheter for bladder decompression -Pain control with acetaminophen /NSAIDs; consider (oxybutynin/tamsulosin) for stent discomfort  -Monitor vitals and symptoms; low threshold for repeat labs if fevers develop -Would recommend keeping her overnight as purulent urine was found when stent was placed  -Schedule outpatient ureteral stent up sizing in ~ 3 months; will arrange follow up    LOS: 4 days    [facility] 08/20/2024

## 2024-08-20 NOTE — Plan of Care (Signed)
" °  Problem: Elimination: Goal: Will not experience complications related to urinary retention Outcome: Not Progressing   Problem: Safety: Goal: Ability to remain free from injury will improve Outcome: Not Progressing   Problem: Urinary Elimination: Goal: Will remain free from infection Outcome: Not Progressing Goal: Ability to achieve and maintain adequate urine output Outcome: Not Progressing   "

## 2024-08-20 NOTE — Progress Notes (Signed)
 Occupational Therapy Treatment Patient Details Name: Dawn Dickerson MRN: [id number] DOB: 23-Oct-1931 Today's Date: 08/20/2024   History of present illness Pt is a pleasant 89 y.o. female with medical history significant for HTN, HLD, macular degeneration, and pulmonary HTN who came in to [facility] ED from home for progressive worsening shortness of breath. MD assessment includes: severe sepsis secondary to acute UTI and E. coli bacteremia, acute on chronic diastolic CHF, severe right hydronephrosis, AKI, elevated troponins likely due to demand ischemia, and hyponatremia.   OT comments  Pt is supine in bed on arrival. Pleasant and agreeable to OT session. She denies pain. Pt performed bed mobility and STS with MOD I/SUPv. LB dressing performed with SBA/supervision. She ambulated 2 laps around the nursing station using RW with SBA/supervision and sp02 89-91% on RA throughout session. Pt is progressing well, but plans to return home with son while recovering. Pt returned to bed with all needs in place and will cont to require skilled acute OT services to maximize her safety and IND to return to PLOF.       If plan is discharge home, recommend the following:  A little help with walking and/or transfers;A little help with bathing/dressing/bathroom;Assistance with cooking/housework;Help with stairs or ramp for entrance   Equipment Recommendations  Tub/shower bench    Recommendations for Other Services      Precautions / Restrictions Precautions Precautions: Fall Recall of Precautions/Restrictions: Intact Restrictions Weight Bearing Restrictions Per Provider Order: No       Mobility Bed Mobility Overal bed mobility: Modified Independent                  Transfers Overall transfer level: Needs assistance Equipment used: Rolling walker (2 wheels) Transfers: Sit to/from Stand Sit to Stand: Supervision           General transfer comment: from EOB at lowest height, no assist  ambulated 2 laps around nursing station with RW and SBA sp02 89-91% on RA     Balance Overall balance assessment: Modified Independent         Standing balance support: Bilateral upper extremity supported, During functional activity Standing balance-Leahy Scale: Good                             ADL either performed or assessed with clinical judgement   ADL Overall ADL's : Needs assistance/impaired                     Lower Body Dressing: Sit to/from stand;Sitting/lateral leans;Supervision/safety Lower Body Dressing Details (indicate cue type and reason): to donn mesh underwear                    Extremity/Trunk Assessment              Vision       Perception     Praxis     Communication Communication Communication: No apparent difficulties   Cognition Arousal: Alert Behavior During Therapy: WFL for tasks assessed/performed                                 Following commands: Intact        Cueing   Cueing Techniques: Verbal cues  Exercises      Shoulder Instructions       General Comments      Pertinent Vitals/ Pain  Pain Assessment Pain Assessment: No/denies pain  Home Living                                          Prior Functioning/Environment              Frequency  Min 2X/week        Progress Toward Goals  OT Goals(current goals can now be found in the care plan section)  Progress towards OT goals: Progressing toward goals  Acute Rehab OT Goals Patient Stated Goal: go home OT Goal Formulation: With patient Time For Goal Achievement: 08/31/24 Potential to Achieve Goals: Good  Plan      Co-evaluation                 AM-PAC OT 6 Clicks Daily Activity     Outcome Measure   Help from another person eating meals?: None Help from another person taking care of personal grooming?: None Help from another person toileting, which includes using toliet,  bedpan, or urinal?: A Little Help from another person bathing (including washing, rinsing, drying)?: A Little Help from another person to put on and taking off regular upper body clothing?: None Help from another person to put on and taking off regular lower body clothing?: A Little 6 Click Score: 21    End of Session Equipment Utilized During Treatment: Rolling walker (2 wheels)  OT Visit Diagnosis: Other abnormalities of gait and mobility (R26.89);Muscle weakness (generalized) (M62.81)   Activity Tolerance Patient tolerated treatment well   Patient Left with call bell/phone within reach;in bed;with bed alarm set;with family/visitor present   Nurse Communication Mobility status        Time: 8857-8791 OT Time Calculation (min): 26 min  Charges: OT General Charges $OT Visit: 1 Visit OT Treatments $Self Care/Home Management : 8-22 mins $Therapeutic Exercise: 8-22 mins  Gustaf Mccarter Dickerson, OTR/L  08/20/24, 1:00 PM   Dawn Dickerson 08/20/2024, 12:48 PM

## 2024-08-20 NOTE — TOC Transition Note (Signed)
 Transition of Care [facility]) - Discharge Note   Patient Details  Name: Dawn Dickerson MRN: [id number] Date of Birth: 06-14-1932  Transition of Care [facility]) CM/SW Contact:  Shasta DELENA Daring, RN Phone Number: 08/20/2024, 2:27 PM   Clinical Narrative:    Patient has discharge orders  Patient and son declined home health services.  No additional TOC needs.  RNCM signing off.    Final next level of care: Home/Self Care Barriers to Discharge: Barriers Resolved   Patient Goals and CMS Choice            Discharge Placement                  Name of family member notified: Georgette Cork Patient and family notified of of transfer: 08/20/24  Discharge Plan and Services Additional resources added to the After Visit Summary for                                       Social Drivers of Health (SDOH) Interventions SDOH Screenings   Food Insecurity: No Food Insecurity (08/16/2024)  Housing: Low Risk (08/16/2024)  Transportation Needs: No Transportation Needs (08/16/2024)  Utilities: Not At Risk (08/16/2024)  Financial Resource Strain: Low Risk  (08/12/2024)   Received from [facility]  Social Connections: Unknown (08/16/2024)  Tobacco Use: Low Risk (08/19/2024)     Readmission Risk Interventions     No data to display

## 2024-08-20 NOTE — Progress Notes (Signed)
 Patient ID: Dawn Dickerson, female   DOB: 11-21-1931, 89 y.o.   MRN: [id number] [facility]iology    SUBJECTIVE: Patient states she feels reasonably well no pain no shortness of breath no lightheaded dizziness ambulated in the room feels well enough for discharge home   Vitals:   08/20/24 0040 08/20/24 0320 08/20/24 0500 08/20/24 0914  BP: (!) 152/63 (!) 135/59  (!) 150/77  Pulse:  (!) 103  97  Resp:  18  17  Temp:  98.9 F (37.2 C)  98.4 F (36.9 C)  TempSrc:  Oral    SpO2:  93%  94%  Weight:   68.2 kg   Height:         Intake/Output Summary (Last 24 hours) at 08/20/2024 1130 Last data filed at 08/20/2024 9394 Gross per 24 hour  Intake 1040.17 ml  Output 360 ml  Net 680.17 ml      PHYSICAL EXAM  General: Well developed, well nourished, in no acute distress HEENT:  Normocephalic and atramatic Neck:  No JVD.  Lungs: Clear bilaterally to auscultation and percussion. Heart: HRRR . Normal S1 and S2 without gallops or murmurs.  Abdomen: Bowel sounds are positive, abdomen soft and non-tender  Msk:  Back normal, normal gait. Normal strength and tone for age. Extremities: No clubbing, cyanosis or edema.   Neuro: Alert and oriented X 3. Psych:  Good affect, responds appropriately   LABS: Basic Metabolic Panel: Recent Labs    08/19/24 0347 08/20/24 0341  NA 137 138  K 3.7 3.5  CL 104 105  CO2 23 23  GLUCOSE 90 99  BUN 17 12  CREATININE 1.16* 1.00  CALCIUM 8.5* 8.4*   Liver Function Tests: No results for input(s): AST, ALT, ALKPHOS, BILITOT, PROT, ALBUMIN in the last 72 hours. No results for input(s): LIPASE, AMYLASE in the last 72 hours. CBC: Recent Labs    08/18/24 0442 08/19/24 0347  WBC 27.2* 16.8*  HGB 9.6* 9.1*  HCT 29.4* 28.0*  MCV 81.7 81.4  PLT 307 271   Cardiac Enzymes: No results for input(s): CKTOTAL, CKMB, CKMBINDEX, TROPONINI in the last 72 hours. BNP: Invalid input(s): POCBNP D-Dimer: No results for input(s):  DDIMER in the last 72 hours. Hemoglobin A1C: No results for input(s): HGBA1C in the last 72 hours. Fasting Lipid Panel: No results for input(s): CHOL, HDL, LDLCALC, TRIG, CHOLHDL, LDLDIRECT in the last 72 hours. Thyroid Function Tests: No results for input(s): TSH, T4TOTAL, T3FREE, THYROIDAB in the last 72 hours.  Invalid input(s): FREET3 Anemia Panel: No results for input(s): VITAMINB12, FOLATE, FERRITIN, TIBC, IRON, RETICCTPCT in the last 72 hours.  DG OR UROLOGY CYSTO IMAGE ([facility] ONLY) Result Date: 08/19/2024 There is no interpretation for this exam.  This order is for images obtained during a surgical procedure.  Please See Surgeries Tab for more information regarding the procedure.     Echo normal left ventricular function  TELEMETRY: Sinus rhythm rate of 95:  ASSESSMENT AND PLAN:  Principal Problem:   Severe sepsis (HCC) Active Problems:   HTN (hypertension)   Hyperlipidemia   Hypothyroidism   UTI (urinary tract infection)   AKI (acute kidney injury)   E coli bacteremia   Hydronephrosis of right kidney   Hyponatremia    Transition antibiotics to p.o. to treat pneumonia to complete course  Continue current hypertension management and control Status post urological procedure with Dr. Hyla of for complex urinary tract infection Elevated white count related to pneumonia and urinary tract infection History of  diastolic dysfunction reasonably stable continue conservative management Hypoxemia related to pneumonia supplemental oxygen inhalers as necessary antibiotics Elevated troponin consistent with demand ischemia rather than ACS Elevated BNP 35,000 consistent with diastolic heart failure continue diuretics and supportive care management.  Do not recommend SGLT2 because of urinary tract infection Fluid resuscitation for mild hypotension and sepsis Echocardiogram with preserved overall left ventricular function appears to be  well-preserved Consider outpatient cardiology follow-up for diastolic heart failure after discharge Increase activity may need physical therapy possible skilled nursing     Cara JONETTA Lovelace, MD 08/20/2024 11:30 AM

## 2024-08-20 NOTE — Progress Notes (Signed)
 Assisted patient to bedside commode to urinate, patient sat on commode for 15 minutes with urge to pee but urine only cam out after she coughs. Upon assisting patient back to bed to do bladder scan noticed a pink purulent urine and measured it to be 10 mls, bladder scan shows 321 mls, patient's bladder is not distended an no complaints of pain, only urge to pee but unsuccessful attempt. Georjean NP informed of situation. Photo of urine taken for reference.

## 2024-08-20 NOTE — Care Management Important Message (Signed)
 Important Message  Patient Details  Name: Dawn Dickerson MRN: [id number] Date of Birth: 13-Aug-1931   Important Message Given:  Yes - Medicare IM     Aradhana Gin W, CMA 08/20/2024, 3:30 PM

## 2024-08-20 NOTE — Discharge Summary (Signed)
 " [facility] Phys[facility] Discharge Summary   Patient name: Dawn Dickerson  Admit date:     08/16/2024  Discharge date: 08/20/2024  Attending Physician: ROANN GOUTY [[id number]]  Discharge Physician: Norval Bar   PCP: Fernande Ophelia JINNY DOUGLAS, MD  Admitted From: Home  Disposition:  Home  Recommendations for Outpatient Follow-up:  Follow up with PCP in 1-2 weeks Ensure completion of 10-days total antibiotic course Monitor for ongoing chronic incontinence symptoms with new ureteric stent in place now Ensure follow up with urology within 1-2 weeks  Home Health:No Equipment/Devices: @ECDMELIST @  Discharge Condition:Stable CODE STATUS:FULL Diet recommendation: Heart Healthy Fluid Restriction: None  Hospital Summary: 89 y.o. 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  Reportedly, when EMS arrived, oxygen saturation was 78% on room air.  She was placed on 15 L oxygen via nonrebreather mask and oxygen came up to 97%.  She was subsequently transported to the ED for further management.   Vital signs in the ED: Temperature 99.6 F (went up to 101 F), respiratory rate 26-30, heart rate 113, oxygen saturation 100% on 15 L via nonrebreather mask.  BP dropped to 96/48. Labs significant for sodium 130, bicarb 17, creatinine 1.78, GFR 27, troponin 464, proBNP greater than 35,000, lactic acid 4.6, WBC 17.9, hemoglobin 8.9.   Admitted for severe sepsis in the setting of acute complicated UTI and presumed bacterial PNA. Hospital course complicated by severe R hydronephrosis and AKI on CKD.  Hospital Course by Problem:  Severe sepsis Acute complicated UTI   E. coli bacteremia No evidence of pneumonia on CT chest. Status post R ureteral stent placement 1/22. Blood culture showed E. coli.  Urine culture is E. Coli recently resistant to nitrofurantoin and ampicillin. Lactic acid improved with IV hydration.  - Transitioned IV ceftriaxone  to PO bactrim  to finish 10 days total antibiotic course   Severe right hydronephrosis Chronic stress incontinence Concern for obstructing process at right UPJ, no clear obstructing stone, urolithiasis or nephrolithiasis seen on CT scanning. Status post R ureteral stent placement 1/22 - Continue with conservative measures at home for incontinence - Ensure completion of antibiotic course as prescribed - Follow up with Urology clinic outpatient (urology team to contact patient for timing)    Chronic diastolic CHF:  Appears clinically euvolemic. proBNP greater than 35,000. Maybe abnormally elevated due to AKI. 2D echo from 08/17/2024 showed EF estimated at 70 to 75% with hyperdynamic LV function, moderate concentric LVH, grade 1 diastolic dysfunction, mildly dilated left and right atria, moderate aortic regurgitation, mild mitral regurgitation and no evidence of left ventricular regional wall motion abnormalities. - Cont home GDMT - follow up with cardiologist outpatient   AKI on CKD stage IIIa:  Resolved. In the setting of complicated UTI with R hydronephrosis. Creatinine 1.16 < 1.74, baseline 1.1-1.2. - monitor BMP as needed    Hyponatremia:  Resolved status post IV NS hydration then fluid restriction.  - monitor BMP as needed    Elevated troponins:  464-533.  ACS ruled out. Likely due to demand ischemia. 2D echo showed preserved EF. Discontinued empiric IV heparin  drip.   - Follow-up with cardiologist outpatient.  Discharge Diagnoses:  Principal Problem:   Severe sepsis [facility]) Active Problems:   UTI (urinary  tract infection)   E coli bacteremia   AKI (acute kidney injury)   HTN (hypertension)    Hyperlipidemia   Hypothyroidism   Hydronephrosis of right kidney   Hyponatremia   Discharge Instructions  Discharge Instructions     Ambulatory referral to Urology   Complete by: As directed    Increase activity slowly   Complete by: As directed       Allergies as of 08/20/2024       Reactions   Methotrexate And Trimetrexate Shortness Of Breath   Prednisone  Other (See Comments)   Patient called and left message she could not take    Alendronate Diarrhea        Medication List     STOP taking these medications    cefUROXime 250 MG tablet Commonly known as: CEFTIN   clobetasol  cream 0.05 % Commonly known as: TEMOVATE    mupirocin  ointment 2 % Commonly known as: BACTROBAN        TAKE these medications    albuterol  108 (90 Base) MCG/ACT inhaler Commonly known as: VENTOLIN  HFA Inhale 2 puffs into the lungs every 6 (six) hours as needed for wheezing or shortness of breath.   aspirin  EC 81 MG tablet Take by mouth.   Calcium 600-400 MG-UNIT Chew Chew by mouth.   escitalopram  10 MG tablet Commonly known as: LEXAPRO  Take 1 tablet by mouth daily.   latanoprost  0.005 % ophthalmic solution Commonly known as: XALATAN  Place 1 drop into the right eye at bedtime.   levothyroxine  100 MCG tablet Commonly known as: SYNTHROID  Take 100 mcg by mouth daily.   losartan  50 MG tablet Commonly known as: COZAAR  Take 50 mg by mouth daily.   metoprolol  succinate 25 MG 24 hr tablet Commonly known as: TOPROL -XL Take 25 mg by mouth daily.   multivitamin tablet Take 1 tablet by mouth daily.   PRESERVISION AREDS 2+MULTI VIT PO Take by mouth.   simvastatin  20 MG tablet Commonly known as: ZOCOR  Take 20 mg by mouth daily.   SM Tussin Cough/Chest Congest 20-200 MG/20ML Liqd Generic drug: Dextromethorphan -guaiFENesin  Take 10 mLs by mouth every 4 (four) hours as needed (chest congestion).   sulfamethoxazole -trimethoprim  800-160 MG tablet Commonly known as: Bactrim   DS Take 1 tablet by mouth 2 (two) times daily for 6 days.   timolol  0.5 % ophthalmic solution Commonly known as: TIMOPTIC  Place 1 drop into both eyes daily.   Vitamin D -1000 Max St 25 MCG (1000 UT) tablet Generic drug: Cholecalciferol  Take 1,000 Units by mouth daily.        Follow-up Information     Alluri, Keller BROCKS, MD. Go in 1 week(s).   Specialty: Cardiology Contact information: [address] [place] [address] [phone]                Allergies[1]  Discharge Exam: Vitals:   08/20/24 0320 08/20/24 0914  BP: (!) 135/59 (!) 150/77  Pulse: (!) 103 97  Resp: 18 17  Temp: 98.9 F (37.2 C) 98.4 F (36.9 C)  SpO2: 93% 94%    Physical Exam Vitals and nursing note reviewed.  Constitutional:      Appearance: She is ill-appearing.     Comments: Weak, frail  HENT:     Head: Normocephalic and atraumatic.  Cardiovascular:     Rate and Rhythm: Normal rate and regular rhythm.     Pulses: Normal pulses.     Heart sounds: Normal heart sounds.  Pulmonary:     Effort: Pulmonary effort is normal.  Breath sounds: Normal breath sounds.  Abdominal:     General: Bowel sounds are normal.     Palpations: Abdomen is soft.  Neurological:     Mental Status: She is alert.     The results of significant diagnostics from this hospitalization (including imaging, microbiology, ancillary and laboratory) are listed below for reference.    Microbiology: Recent Results (from the past 240 hours)  Resp panel by RT-PCR (RSV, Flu A&B, Covid) Anterior Nasal Swab     Status: None   Collection Time: 08/16/24  1:59 PM   Specimen: Anterior Nasal Swab  Result Value Ref Range Status   SARS Coronavirus 2 by RT PCR NEGATIVE NEGATIVE Final    Comment: (NOTE) SARS-CoV-2 target nucleic acids are NOT DETECTED.  The SARS-CoV-2 RNA is generally detectable in upper respiratory specimens during the acute phase of infection. The lowest concentration of SARS-CoV-2 viral copies  this assay can detect is 138 copies/mL. A negative result does not preclude SARS-Cov-2 infection and should not be used as the sole basis for treatment or other patient management decisions. A negative result may occur with  improper specimen collection/handling, submission of specimen other than nasopharyngeal swab, presence of viral mutation(s) within the areas targeted by this assay, and inadequate number of viral copies(<138 copies/mL). A negative result must be combined with clinical observations, patient history, and epidemiological information. The expected result is Negative.  Fact Sheet for Patients:  bloggercourse.com  Fact Sheet for Healthcare Providers:  seriousbroker.it  This test is no t yet approved or cleared by the [place]  FDA and  has been authorized for detection and/or diagnosis of SARS-CoV-2 by FDA under an Emergency Use Authorization (EUA). This EUA will remain  in effect (meaning this test can be used) for the duration of the COVID-19 declaration under Section 564(b)(1) of the Act, 21 U.S.C.section 360bbb-3(b)(1), unless the authorization is terminated  or revoked sooner.       Influenza A by PCR NEGATIVE NEGATIVE Final   Influenza B by PCR NEGATIVE NEGATIVE Final    Comment: (NOTE) The Xpert Xpress SARS-CoV-2/FLU/RSV plus assay is intended as an aid in the diagnosis of influenza from Nasopharyngeal swab specimens and should not be used as a sole basis for treatment. Nasal washings and aspirates are unacceptable for Xpert Xpress SARS-CoV-2/FLU/RSV testing.  Fact Sheet for Patients: bloggercourse.com  Fact Sheet for Healthcare Providers: seriousbroker.it  This test is not yet approved or cleared by the [place]  FDA and has been authorized for detection and/or diagnosis of SARS-CoV-2 by FDA under an Emergency Use Authorization (EUA). This EUA  will remain in effect (meaning this test can be used) for the duration of the COVID-19 declaration under Section 564(b)(1) of the Act, 21 U.S.C. section 360bbb-3(b)(1), unless the authorization is terminated or revoked.     Resp Syncytial Virus by PCR NEGATIVE NEGATIVE Final    Comment: (NOTE) Fact Sheet for Patients: bloggercourse.com  Fact Sheet for Healthcare Providers: seriousbroker.it  This test is not yet approved or cleared by the [place]  FDA and has been authorized for detection and/or diagnosis of SARS-CoV-2 by FDA under an Emergency Use Authorization (EUA). This EUA will remain in effect (meaning this test can be used) for the duration of the COVID-19 declaration under Section 564(b)(1) of the Act, 21 U.S.C. section 360bbb-3(b)(1), unless the authorization is terminated or revoked.  Performed at [facility], [address] [place], [place] [address]   Blood Culture (routine x 2)     Status:  Abnormal   Collection Time: 08/16/24  2:32 PM   Specimen: BLOOD  Result Value Ref Range Status   Specimen Description   Final    BLOOD BLOOD RIGHT ARM Performed at [facility], [address] [place], [place] [address]    Special Requests   Final    BOTTLES DRAWN AEROBIC AND ANAEROBIC Blood Culture adequate volume Performed at [facility], [address] [place], [place] [address]    Culture  Setup Time   Final    GRAM NEGATIVE RODS IN BOTH AEROBIC AND ANAEROBIC BOTTLES CRITICAL RESULT CALLED TO, READ BACK BY AND VERIFIED WITH:  NATHAN BELUE AT 0035 08/17/24 JG Performed at [facility], [address], [place], [place] [address]    Culture ESCHERICHIA COLI (A)  Final   Report Status 08/19/2024 FINAL  Final   Organism ID, Bacteria ESCHERICHIA COLI  Final      Susceptibility   Escherichia coli - MIC*    AMPICILLIN <=2 SENSITIVE Sensitive     CEFAZOLIN (NON-URINE) <=1 SENSITIVE  Sensitive     CEFEPIME  <=0.12 SENSITIVE Sensitive     ERTAPENEM <=0.12 SENSITIVE Sensitive     CEFTRIAXONE  <=0.25 SENSITIVE Sensitive     CIPROFLOXACIN <=0.06 SENSITIVE Sensitive     GENTAMICIN <=1 SENSITIVE Sensitive     MEROPENEM <=0.25 SENSITIVE Sensitive     TRIMETH /SULFA  <=20 SENSITIVE Sensitive     AMPICILLIN/SULBACTAM <=2 SENSITIVE Sensitive     PIP/TAZO Value in next row Sensitive      <=4 SENSITIVEThis is a modified FDA-approved test that has been validated and its performance characteristics determined by the reporting laboratory.  This laboratory is certified under the Clinical Laboratory Improvement Amendments CLIA as qualified to perform high complexity clinical laboratory testing.    * ESCHERICHIA COLI  Blood Culture (routine x 2)     Status: Abnormal   Collection Time: 08/16/24  2:32 PM   Specimen: BLOOD  Result Value Ref Range Status   Specimen Description   Final    BLOOD BLOOD LEFT ARM Performed at [facility], [address] [place], [place] [address]    Special Requests   Final    BOTTLES DRAWN AEROBIC AND ANAEROBIC Blood Culture adequate volume Performed at [facility], [address] [place], [place] [address]    Culture  Setup Time   Final    GRAM NEGATIVE RODS IN BOTH AEROBIC AND ANAEROBIC BOTTLES CRITICAL VALUE NOTED.  VALUE IS CONSISTENT WITH PREVIOUSLY REPORTED AND CALLED VALUE. Performed at [facility], [address] [place], [place] [address]    Culture (A)  Final    ESCHERICHIA COLI SUSCEPTIBILITIES PERFORMED ON PREVIOUS CULTURE WITHIN THE LAST 5 DAYS. Performed at [facility], [address] [place], [place] [address]    Report Status 08/19/2024 FINAL  Final  Urine Culture     Status: Abnormal   Collection Time: 08/16/24  2:32 PM   Specimen: Urine, Catheterized  Result Value Ref Range Status   Specimen Description   Final    URINE, CATHETERIZED Performed at [facility], [address], [place], [place] [address]    Special Requests   Final    NONE Performed at [facility], [address] [place], [place] [address]    Culture >=100,000 COLONIES/mL ESCHERICHIA COLI (A)  Final   Report Status 08/18/2024 FINAL  Final   Organism ID, Bacteria ESCHERICHIA COLI (A)  Final  Susceptibility   Escherichia coli - MIC*    AMPICILLIN <=2 SENSITIVE Sensitive     CEFAZOLIN (URINE) Value in next row Sensitive      <=1 SENSITIVEThis is a modified FDA-approved test that has been validated and its performance characteristics determined by the reporting laboratory.  This laboratory is certified under the Clinical Laboratory Improvement Amendments CLIA as qualified to perform high complexity clinical laboratory testing.    CEFEPIME  Value in next row Sensitive      <=1 SENSITIVEThis is a modified FDA-approved test that has been validated and its performance characteristics determined by the reporting laboratory.  This laboratory is certified under the Clinical Laboratory Improvement Amendments CLIA as qualified to perform high complexity clinical laboratory testing.    ERTAPENEM Value in next row Sensitive      <=1 SENSITIVEThis is a modified FDA-approved test that has been validated and its performance characteristics determined by the reporting laboratory.  This laboratory is certified under the Clinical Laboratory Improvement Amendments CLIA as qualified to perform high complexity clinical laboratory testing.    CEFTRIAXONE  Value in next row Sensitive      <=1 SENSITIVEThis is a modified FDA-approved test that has been validated and its performance characteristics determined by the reporting laboratory.  This laboratory is certified under the Clinical Laboratory Improvement Amendments CLIA as qualified to perform high complexity clinical laboratory testing.    CIPROFLOXACIN Value in next row Sensitive      <=1 SENSITIVEThis is a modified FDA-approved test that has been validated and  its performance characteristics determined by the reporting laboratory.  This laboratory is certified under the Clinical Laboratory Improvement Amendments CLIA as qualified to perform high complexity clinical laboratory testing.    GENTAMICIN Value in next row Sensitive      <=1 SENSITIVEThis is a modified FDA-approved test that has been validated and its performance characteristics determined by the reporting laboratory.  This laboratory is certified under the Clinical Laboratory Improvement Amendments CLIA as qualified to perform high complexity clinical laboratory testing.    NITROFURANTOIN Value in next row Sensitive      <=1 SENSITIVEThis is a modified FDA-approved test that has been validated and its performance characteristics determined by the reporting laboratory.  This laboratory is certified under the Clinical Laboratory Improvement Amendments CLIA as qualified to perform high complexity clinical laboratory testing.    TRIMETH /SULFA  Value in next row Sensitive      <=1 SENSITIVEThis is a modified FDA-approved test that has been validated and its performance characteristics determined by the reporting laboratory.  This laboratory is certified under the Clinical Laboratory Improvement Amendments CLIA as qualified to perform high complexity clinical laboratory testing.    AMPICILLIN/SULBACTAM Value in next row Sensitive      <=1 SENSITIVEThis is a modified FDA-approved test that has been validated and its performance characteristics determined by the reporting laboratory.  This laboratory is certified under the Clinical Laboratory Improvement Amendments CLIA as qualified to perform high complexity clinical laboratory testing.    PIP/TAZO Value in next row Sensitive      <=4 SENSITIVEThis is a modified FDA-approved test that has been validated and its performance characteristics determined by the reporting laboratory.  This laboratory is certified under the Clinical Laboratory Improvement Amendments  CLIA as qualified to perform high complexity clinical laboratory testing.    MEROPENEM Value in next row Sensitive      <=4 SENSITIVEThis is a modified FDA-approved test that has been validated and its performance characteristics determined by the  reporting laboratory.  This laboratory is certified under the Clinical Laboratory Improvement Amendments CLIA as qualified to perform high complexity clinical laboratory testing.    * >=100,000 COLONIES/mL ESCHERICHIA COLI  Blood Culture ID Panel (Reflexed)     Status: Abnormal   Collection Time: 08/16/24  2:32 PM  Result Value Ref Range Status   Enterococcus faecalis NOT DETECTED NOT DETECTED Final   Enterococcus Faecium NOT DETECTED NOT DETECTED Final   Listeria monocytogenes NOT DETECTED NOT DETECTED Final   Staphylococcus species NOT DETECTED NOT DETECTED Final   Staphylococcus aureus (BCID) NOT DETECTED NOT DETECTED Final   Staphylococcus epidermidis NOT DETECTED NOT DETECTED Final   Staphylococcus lugdunensis NOT DETECTED NOT DETECTED Final   Streptococcus species NOT DETECTED NOT DETECTED Final   Streptococcus agalactiae NOT DETECTED NOT DETECTED Final   Streptococcus pneumoniae NOT DETECTED NOT DETECTED Final   Streptococcus pyogenes NOT DETECTED NOT DETECTED Final   A.calcoaceticus-baumannii NOT DETECTED NOT DETECTED Final   Bacteroides fragilis NOT DETECTED NOT DETECTED Final   Enterobacterales DETECTED (A) NOT DETECTED Final    Comment: Enterobacterales represent a large order of gram negative bacteria, not a single organism. CRITICAL RESULT CALLED TO, READ BACK BY AND VERIFIED WITH:  NATHAN BELUE AT 0035 08/17/24 JG    Enterobacter cloacae complex NOT DETECTED NOT DETECTED Final   Escherichia coli DETECTED (A) NOT DETECTED Final    Comment: CRITICAL RESULT CALLED TO, READ BACK BY AND VERIFIED WITH:  NATHAN BELUE AT 0035 08/17/24 JG    Klebsiella aerogenes NOT DETECTED NOT DETECTED Final   Klebsiella oxytoca NOT DETECTED NOT DETECTED  Final   Klebsiella pneumoniae NOT DETECTED NOT DETECTED Final   Proteus species NOT DETECTED NOT DETECTED Final   Salmonella species NOT DETECTED NOT DETECTED Final   Serratia marcescens NOT DETECTED NOT DETECTED Final   Haemophilus influenzae NOT DETECTED NOT DETECTED Final   Neisseria meningitidis NOT DETECTED NOT DETECTED Final   Pseudomonas aeruginosa NOT DETECTED NOT DETECTED Final   Stenotrophomonas maltophilia NOT DETECTED NOT DETECTED Final   Candida albicans NOT DETECTED NOT DETECTED Final   Candida auris NOT DETECTED NOT DETECTED Final   Candida glabrata NOT DETECTED NOT DETECTED Final   Candida krusei NOT DETECTED NOT DETECTED Final   Candida parapsilosis NOT DETECTED NOT DETECTED Final   Candida tropicalis NOT DETECTED NOT DETECTED Final   Cryptococcus neoformans/gattii NOT DETECTED NOT DETECTED Final   CTX-M ESBL NOT DETECTED NOT DETECTED Final   Carbapenem resistance IMP NOT DETECTED NOT DETECTED Final   Carbapenem resistance KPC NOT DETECTED NOT DETECTED Final   Carbapenem resistance NDM NOT DETECTED NOT DETECTED Final   Carbapenem resist OXA 48 LIKE NOT DETECTED NOT DETECTED Final   Carbapenem resistance VIM NOT DETECTED NOT DETECTED Final    Comment: Performed at [facility], [address] [place], [place] [address]     Labs: ProBNP, BNP (last 5 results) Recent Labs    08/16/24 1359  PROBNP >35,000.0*   Basic Metabolic Panel: Recent Labs  Lab 08/16/24 1359 08/17/24 0332 08/18/24 0442 08/19/24 0347 08/20/24 0341  NA 130* 128* 135 137 138  K 4.0 3.8 3.8 3.7 3.5  CL 96* 95* 102 104 105  CO2 17* 18* 21* 23 23  GLUCOSE 111* 91 88 90 99  BUN 18 24* 23 17 12   CREATININE 1.78* 1.74* 1.37* 1.16* 1.00  CALCIUM 8.5* 8.1* 8.9 8.5* 8.4*   Liver Function Tests: Recent Labs  Lab 08/16/24 1359 08/17/24 0332  AST 66*  73*  ALT <5 <5  ALKPHOS 359* 290*  BILITOT 0.5 0.3  PROT 6.3* 6.1*  ALBUMIN 3.2* 3.0*   No results for input(s): LIPASE,  AMYLASE in the last 168 hours. No results for input(s): AMMONIA in the last 168 hours. CBC: Recent Labs  Lab 08/16/24 1359 08/17/24 0332 08/18/24 0442 08/19/24 0347  WBC 17.9* 29.8* 27.2* 16.8*  NEUTROABS 16.8*  --   --   --   HGB 8.9* 8.5* 9.6* 9.1*  HCT 27.9* 25.9* 29.4* 28.0*  MCV 83.0 81.2 81.7 81.4  PLT 279 274 307 271   Cardiac Enzymes: No results for input(s): CKTOTAL, CKMB, CKMBINDEX, TROPONINI, TROPONINIHS in the last 168 hours. BNP: No results for input(s): BNP in the last 168 hours. CBG: No results for input(s): GLUCAP in the last 168 hours. D-Dimer No results for input(s): DDIMER in the last 72 hours. Hgb A1c No results for input(s): HGBA1C in the last 72 hours. Lipid Profile No results for input(s): CHOL, HDL, LDLCALC, TRIG, CHOLHDL, LDLDIRECT in the last 72 hours. Thyroid function studies No results for input(s): TSH, T4TOTAL, FREET4, T3FREE, THYROIDAB in the last 72 hours.  Invalid input(s): FREET3 Anemia work up No results for input(s): VITAMINB12, FOLATE, FERRITIN, TIBC, IRON, RETICCTPCT in the last 72 hours. Urinalysis    Component Value Date/Time   COLORURINE YELLOW (A) 08/20/2024 0126   APPEARANCEUR TURBID (A) 08/20/2024 0126   APPEARANCEUR CLEAR 01/12/2014 1505   LABSPEC 1.019 08/20/2024 0126   LABSPEC 1.005 01/12/2014 1505   PHURINE 5.0 08/20/2024 0126   GLUCOSEU NEGATIVE 08/20/2024 0126   GLUCOSEU NEGATIVE 01/12/2014 1505   HGBUR LARGE (A) 08/20/2024 0126   BILIRUBINUR NEGATIVE 08/20/2024 0126   BILIRUBINUR NEGATIVE 01/12/2014 1505   KETONESUR NEGATIVE 08/20/2024 0126   PROTEINUR 100 (A) 08/20/2024 0126   NITRITE NEGATIVE 08/20/2024 0126   LEUKOCYTESUR LARGE (A) 08/20/2024 0126   LEUKOCYTESUR 1+ 01/12/2014 1505   Sepsis Labs Recent Labs  Lab 08/16/24 1359 08/16/24 1559 08/17/24 0332 08/18/24 0442 08/19/24 0347  PROCALCITON  --  100.00  --   --   --   WBC 17.9*  --  29.8*  27.2* 16.8*    Procedures/Studies: DG OR UROLOGY CYSTO IMAGE ([facility] ONLY) Result Date: 08/19/2024 There is no interpretation for this exam.  This order is for images obtained during a surgical procedure.  Please See Surgeries Tab for more information regarding the procedure.   ECHOCARDIOGRAM COMPLETE Result Date: 08/17/2024    ECHOCARDIOGRAM REPORT   Patient Name:   DESTYNI HOPPEL Date of Exam: 08/17/2024 Medical Rec #:  [id number]       Height:       63.0 in Accession #:    [id number]      Weight:       142.0 lb Date of Birth:  [birth year]       BSA:          1.672 m Patient Age:    89 years        BP:           142/77 mmHg Patient Gender: F               HR:           79 bpm. Exam Location:  [facility] Procedure: 2D Echo, Cardiac Doppler and Color Doppler (Both Spectral and Color            Flow Doppler were utilized during procedure). Indications:     CHF  History:  Patient has no prior history of Echocardiogram examinations.                  Risk Factors:Hypertension and Dyslipidemia.  Sonographer:     Philomena Daring Referring Phys:  [id number] XZDYJA PAUDEL Diagnosing Phys: Cara JONETTA Lovelace MD IMPRESSIONS  1. Left ventricular ejection fraction, by estimation, is 70 to 75%. The left ventricle has hyperdynamic function. The left ventricle has no regional wall motion abnormalities. There is mild concentric left ventricular hypertrophy. Left ventricular diastolic parameters are consistent with Grade I diastolic dysfunction (impaired relaxation).  2. Right ventricular systolic function is normal. The right ventricular size is normal.  3. Left atrial size was mildly dilated.  4. Right atrial size was mildly dilated.  5. The mitral valve is grossly normal. Mild mitral valve regurgitation.  6. The aortic valve is calcified. Aortic valve regurgitation is moderate. Aortic valve sclerosis/calcification is present, without any evidence of aortic stenosis. FINDINGS  Left Ventricle: Left ventricular ejection fraction, by  estimation, is 70 to 75%. The left ventricle has hyperdynamic function. The left ventricle has no regional wall motion abnormalities. Strain was performed and the global longitudinal strain is indeterminate. The left ventricular internal cavity size was normal in size. There is mild concentric left ventricular hypertrophy. Left ventricular diastolic parameters are consistent with Grade I diastolic dysfunction (impaired relaxation). Right Ventricle: The right ventricular size is normal. No increase in right ventricular wall thickness. Right ventricular systolic function is normal. Left Atrium: Left atrial size was mildly dilated. Right Atrium: Right atrial size was mildly dilated. Pericardium: There is no evidence of pericardial effusion. Mitral Valve: The mitral valve is grossly normal. Mild mitral valve regurgitation. Tricuspid Valve: The tricuspid valve is normal in structure. Tricuspid valve regurgitation is mild. Aortic Valve: The aortic valve is calcified. Aortic valve regurgitation is moderate. Aortic regurgitation PHT measures 549 msec. Aortic valve sclerosis/calcification is present, without any evidence of aortic stenosis. Aortic valve mean gradient measures  8.5 mmHg. Aortic valve peak gradient measures 15.6 mmHg. Aortic valve area, by VTI measures 1.75 cm. Pulmonic Valve: The pulmonic valve was not well visualized. Pulmonic valve regurgitation is not visualized. Aorta: The aortic root was not well visualized. IAS/Shunts: No atrial level shunt detected by color flow Doppler. Additional Comments: 3D was performed not requiring image post processing on an independent workstation and was indeterminate.  LEFT VENTRICLE PLAX 2D LVIDd:         3.80 cm   Diastology LVIDs:         2.20 cm   LV e' medial:    6.31 cm/s LV PW:         1.20 cm   LV E/e' medial:  15.8 LV IVS:        1.30 cm   LV e' lateral:   7.72 cm/s LVOT diam:     1.90 cm   LV E/e' lateral: 13.0 LV SV:         63 LV SV Index:   38 LVOT Area:      2.84 cm  RIGHT VENTRICLE             IVC RV Basal diam:  3.10 cm     IVC diam: 1.90 cm RV Mid diam:    1.90 cm RV S prime:     10.40 cm/s TAPSE (M-mode): 1.8 cm LEFT ATRIUM             Index        RIGHT ATRIUM  Index LA diam:        3.70 cm 2.21 cm/m   RA Area:     13.70 cm LA Vol (A2C):   68.4 ml 40.92 ml/m  RA Volume:   27.70 ml  16.57 ml/m LA Vol (A4C):   53.9 ml 32.24 ml/m LA Biplane Vol: 60.6 ml 36.25 ml/m  AORTIC VALVE AV Area (Vmax):    1.60 cm AV Area (Vmean):   1.53 cm AV Area (VTI):     1.75 cm AV Vmax:           197.25 cm/s AV Vmean:          136.750 cm/s AV VTI:            0.361 m AV Peak Grad:      15.6 mmHg AV Mean Grad:      8.5 mmHg LVOT Vmax:         111.00 cm/s LVOT Vmean:        73.900 cm/s LVOT VTI:          0.222 m LVOT/AV VTI ratio: 0.62 AI PHT:            549 msec  AORTA Ao Root diam: 2.70 cm MITRAL VALVE                TRICUSPID VALVE MV Area (PHT): 3.02 cm     TR Peak grad:   19.4 mmHg MV Decel Time: 251 msec     TR Vmax:        220.00 cm/s MV E velocity: 100.00 cm/s MV A velocity: 112.00 cm/s  SHUNTS MV E/A ratio:  0.89         Systemic VTI:  0.22 m                             Systemic Diam: 1.90 cm Cara JONETTA Lovelace MD Electronically signed by Cara JONETTA Lovelace MD Signature Date/Time: 08/17/2024/2:23:37 PM    Final    CT CHEST ABDOMEN PELVIS WO CONTRAST Result Date: 08/16/2024 CLINICAL DATA:  Sepsis right flank pain dyspnea on exertion EXAM: CT CHEST, ABDOMEN AND PELVIS WITHOUT CONTRAST TECHNIQUE: Multidetector CT imaging of the chest, abdomen and pelvis was performed following the standard protocol without IV contrast. RADIATION DOSE REDUCTION: This exam was performed according to the departmental dose-optimization program which includes automated exposure control, adjustment of the mA and/or kV according to patient size and/or use of iterative reconstruction technique. COMPARISON:  Chest x-ray 08/16/2024, CT 04/15/2023, 08/11/2023 FINDINGS: CT CHEST FINDINGS  Cardiovascular: Limited assessment without intravenous contrast. Moderate aortic atherosclerosis. No aneurysm. Aortic valvular calcification. Coronary vascular calcification. Cardiomegaly. No pericardial effusion Mediastinum/Nodes: Patent trachea. No thyroid mass. No suspicious lymph nodes. Esophagus within normal limits. Lungs/Pleura: Trace right effusion. Mild apical scarring. No consolidation, or pneumothorax. Musculoskeletal: No acute osseous abnormality.  Degenerative changes CT ABDOMEN PELVIS FINDINGS Hepatobiliary: No focal liver abnormality is seen. No gallstones, gallbladder wall thickening, or biliary dilatation. Pancreas: Atrophic. No acute inflammation. Numerous hypodensities throughout the pancreas as was seen on prior imaging. Reference right inferior pancreatic head low-density lesion measuring 20 mm on series 2, image 64, previously 19 mm. These have been present since 2015 Spleen: Normal in size without focal abnormality. Adrenals/Urinary Tract: Adrenal glands are normal. No hydronephrosis on the left. Interim development of moderate severe right hydronephrosis. Perinephric fat stranding on the right. No obstructing stone is seen. The bladder is unremarkable Stomach/Bowel: The stomach is decompressed.  There is no dilated small bowel. No acute bowel wall thickening. Diverticular disease of the left colon Vascular/Lymphatic: Aortic atherosclerosis. No enlarged abdominal or pelvic lymph nodes. Reproductive: Uterus and bilateral adnexa are unremarkable. Other: Negative for ascites or free air Musculoskeletal: Degenerative changes.  No acute osseous abnormality IMPRESSION: 1. Interim development of severe right hydronephrosis and perinephric fat stranding. No obstructing stone is seen. Concern raised for obstructing process at the right UPJ. Fat stranding is nonspecific but superimposed upper UTI/pyelonephritis not excluded 2. Trace right effusion. 3. Cardiomegaly. 4. Numerous hypodensities/cystic  density throughout the pancreas as was seen on prior imaging and present dating back to 2015. No specific imaging follow-up recommended given patient age. 5. Diverticular disease of the left colon without acute inflammatory process. 6. Aortic atherosclerosis. Aortic Atherosclerosis (ICD10-I70.0). Electronically Signed   By: Luke Bun M.D.   On: 08/16/2024 16:50   DG Chest Portable 1 View Result Date: 08/16/2024 CLINICAL DATA:  Hypoxia. EXAM: PORTABLE CHEST 1 VIEW COMPARISON:  04/15/2023. FINDINGS: Low lung volumes. Cardiomegaly. Chronic elevation of the right hemidiaphragm. Bilateral interstitial prominence, most pronounced at the lung bases, suspicious for pulmonary vascular congestion with possible mild interstitial pulmonary edema. Mild right basilar atelectasis. No pneumothorax. No acute osseous abnormality. IMPRESSION: 1. Low lung volumes with bilateral interstitial prominence most pronounced at the lung bases, suspicious for pulmonary vascular congestion with possible mild interstitial edema. 2. Cardiomegaly. 3. Chronic elevation of the right hemidiaphragm with mild right basilar atelectasis. Electronically Signed   By: Harrietta Sherry M.D.   On: 08/16/2024 15:20    Time coordinating discharge: 45 mins  SIGNED:  Norval Bar, MD [facility] 08/20/24, 2:01 PM     [1]  Allergies Allergen Reactions   Methotrexate And Trimetrexate Shortness Of Breath   Prednisone  Other (See Comments)    Patient called and left message she could not take    Alendronate Diarrhea   "

## 2024-08-24 NOTE — Progress Notes (Signed)
 Patient ID: Dawn Dickerson, female   DOB: 1931/09/08, 89 y.o.   MRN: [id number] [facility]    SUBJECTIVE: Patient still feels somewhat weak but appears to be doing somewhat better less shortness of breath treated with antibiotics denies any fever chills or sweats resting comfortably in bed.  Thinks she may have had a significant kidney infection   Vitals:   08/20/24 0040 08/20/24 0320 08/20/24 0500 08/20/24 0914  BP: (!) 152/63 (!) 135/59  (!) 150/77  Pulse:  (!) 103  97  Resp:  18  17  Temp:  98.9 F (37.2 C)  98.4 F (36.9 C)  TempSrc:  Oral    SpO2:  93%  94%  Weight:   68.2 kg   Height:        No intake or output data in the 24 hours ending 08/24/24 0925    PHYSICAL EXAM  General: Well developed, well nourished, in no acute distress HEENT:  Normocephalic and atramatic Neck:  No JVD.  Lungs: Clear bilaterally to auscultation and percussion. Heart: HRRR . Normal S1 and S2 without gallops or murmurs.  Abdomen: Bowel sounds are positive, abdomen soft and non-tender  Msk:  Back normal, normal gait. Normal strength and tone for age. Extremities: No clubbing, cyanosis or edema.   Neuro: Alert and oriented X 3. Psych:  Good affect, responds appropriately   LABS: Basic Metabolic Panel: No results for input(s): NA, K, CL, CO2, GLUCOSE, BUN, CREATININE, CALCIUM, MG, PHOS in the last 72 hours. Liver Function Tests: No results for input(s): AST, ALT, ALKPHOS, BILITOT, PROT, ALBUMIN in the last 72 hours. No results for input(s): LIPASE, AMYLASE in the last 72 hours. CBC: No results for input(s): WBC, NEUTROABS, HGB, HCT, MCV, PLT in the last 72 hours. Cardiac Enzymes: No results for input(s): CKTOTAL, CKMB, CKMBINDEX, TROPONINI in the last 72 hours. BNP: Invalid input(s): POCBNP D-Dimer: No results for input(s): DDIMER in the last 72 hours. Hemoglobin A1C: No results for input(s): HGBA1C in the last 72  hours. Fasting Lipid Panel: No results for input(s): CHOL, HDL, LDLCALC, TRIG, CHOLHDL, LDLDIRECT in the last 72 hours. Thyroid Function Tests: No results for input(s): TSH, T4TOTAL, T3FREE, THYROIDAB in the last 72 hours.  Invalid input(s): FREET3 Anemia Panel: No results for input(s): VITAMINB12, FOLATE, FERRITIN, TIBC, IRON, RETICCTPCT in the last 72 hours.  No results found.   Echo preserved left ventricular function  TELEMETRY: Sinus rhythm rate of around 90:  ASSESSMENT AND PLAN:  Principal Problem:   Severe sepsis (HCC) Active Problems:   HTN (hypertension)   Hyperlipidemia   Hypothyroidism   UTI (urinary tract infection)   AKI (acute kidney injury)   E coli bacteremia   Hydronephrosis of right kidney   Hyponatremia    Plan Continue to follow-up EKGs troponins and telemetry no obvious arrhythmias no evidence of ACS Possible pneumonia recommend broad-spectrum antibiotic therapy supplemental oxygen inhalers consider pulmonary input Antibiotics as well as urology input for possible kidney infection Agree with hypertension management and control currently on metoprolol  and losartan  Hyperlipidemia maintain simvastatin  therapy for lipid management LDL goal should be less than 70 Echocardiogram for assessment of left ventricular function wall motion Consider physical and Occupational Therapy to help with rehab strength training and conditioning No invasive cardiac procedures recommended at this stage  Dawn JONETTA Lovelace, MD 08/24/2024 9:25 AM

## 2024-09-02 NOTE — Addendum Note (Signed)
 Addendum  created 09/02/24 0828 by Vicci Camellia Glatter, MD   Attestation recorded in Intraprocedure, Intraprocedure Attestations deleted, Intraprocedure Attestations filed

## 2024-10-21 ENCOUNTER — Ambulatory Visit: Admitting: Urology
